# Patient Record
Sex: Male | Born: 1975 | Race: Black or African American | Hispanic: No | Marital: Single | Smoking: Current every day smoker
Health system: Southern US, Community
[De-identification: ages and names within clinical notes are randomized; demographics above are authoritative.]

## PROBLEM LIST (undated history)

## (undated) DIAGNOSIS — M1990 Unspecified osteoarthritis, unspecified site: Secondary | ICD-10-CM

## (undated) DIAGNOSIS — G35D Multiple sclerosis, unspecified: Secondary | ICD-10-CM

## (undated) DIAGNOSIS — G35 Multiple sclerosis: Secondary | ICD-10-CM

## (undated) HISTORY — DX: Unspecified osteoarthritis, unspecified site: M19.90

## (undated) HISTORY — DX: Multiple sclerosis: G35

## (undated) HISTORY — PX: OTHER SURGICAL HISTORY: SHX169

## (undated) HISTORY — DX: Multiple sclerosis, unspecified: G35.D

---

## 2010-09-07 ENCOUNTER — Inpatient Hospital Stay (INDEPENDENT_AMBULATORY_CARE_PROVIDER_SITE_OTHER)
Admission: RE | Admit: 2010-09-07 | Discharge: 2010-09-07 | Disposition: A | Discharge status: Home / Self Care | Payer: Self-pay | Source: Ambulatory Visit | Attending: Family Medicine | Admitting: Family Medicine

## 2010-09-07 DIAGNOSIS — M799 Soft tissue disorder, unspecified: Secondary | ICD-10-CM

## 2011-04-29 ENCOUNTER — Emergency Department (HOSPITAL_COMMUNITY)
Admission: EM | Admit: 2011-04-29 | Discharge: 2011-04-29 | Disposition: A | Discharge status: Home / Self Care | Payer: Self-pay | Attending: Emergency Medicine | Admitting: Emergency Medicine

## 2011-04-29 DIAGNOSIS — W260XXA Contact with knife, initial encounter: Secondary | ICD-10-CM | POA: Insufficient documentation

## 2011-04-29 DIAGNOSIS — S61409A Unspecified open wound of unspecified hand, initial encounter: Secondary | ICD-10-CM | POA: Insufficient documentation

## 2011-04-29 DIAGNOSIS — W261XXA Contact with sword or dagger, initial encounter: Secondary | ICD-10-CM | POA: Insufficient documentation

## 2011-04-29 DIAGNOSIS — M79609 Pain in unspecified limb: Secondary | ICD-10-CM | POA: Insufficient documentation

## 2011-04-29 DIAGNOSIS — S61419A Laceration without foreign body of unspecified hand, initial encounter: Secondary | ICD-10-CM

## 2011-04-29 DIAGNOSIS — F17200 Nicotine dependence, unspecified, uncomplicated: Secondary | ICD-10-CM | POA: Insufficient documentation

## 2011-04-29 MED ORDER — TETANUS-DIPHTH-ACELL PERTUSSIS 5-2.5-18.5 LF-MCG/0.5 IM SUSP
0.5000 mL | Freq: Once | INTRAMUSCULAR | Status: AC
  Administered 2011-04-29: 0.5 mL via INTRAMUSCULAR
  Filled 2011-04-29: qty 0.5

## 2011-04-29 MED ORDER — DOXYCYCLINE HYCLATE 100 MG PO CAPS: 100.0000 mg | ORAL_CAPSULE | Freq: Two times a day (BID) | ORAL | Status: AC
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
## 2011-04-29 NOTE — ED Notes (Signed)
1 inch laceration to lt. Palm area from cutting meat.  Bleeding controlled, dressing applied

## 2011-04-29 NOTE — ED Provider Notes (Signed)
History     CSN: [id number]  Arrival date & time 04/29/11  1230   First MD Initiated Contact with Patient 04/29/11 1349      Chief Complaint  Patient presents with  . Laceration    (Consider location/radiation/quality/duration/timing/severity/associated sxs/prior treatment) Patient is a 35 y.o. male presenting with skin laceration. The history is provided by the patient (The patient states that he accidentally cut his left hand while cutting knife).  Laceration  The incident occurred 3 to 5 hours ago. Pain location: Left hand. The laceration is 1 cm in size. The laceration mechanism was a a clean knife. The pain is at a severity of 2/10. The pain is mild. The pain has been constant since onset. She reports no foreign bodies present. Her tetanus status is out of date.    History reviewed. No pertinent past medical history.  History reviewed. No pertinent past surgical history.  History reviewed. No pertinent family history.  History  Substance Use Topics  . Smoking status: Current Everyday Smoker  . Smokeless tobacco: Not on file  . Alcohol Use: Yes    OB History    Grav Para Term Preterm Abortions TAB SAB Ect Mult Living                  Review of Systems  Constitutional: Negative for fatigue.  HENT: Negative for congestion, sinus pressure and ear discharge.   Eyes: Negative for discharge.  Respiratory: Negative for cough.   Cardiovascular: Negative for chest pain.  Gastrointestinal: Negative for abdominal pain and diarrhea.  Genitourinary: Negative for frequency and hematuria.  Musculoskeletal: Negative for back pain.       1 cm lac to thenar eminence left hand  Skin: Negative for rash.  Neurological: Negative for seizures and headaches.  Hematological: Negative.   Psychiatric/Behavioral: Negative for hallucinations.    Allergies  Review of patient's allergies indicates no known allergies.  Home Medications   Current Outpatient Rx  Name Route Sig  Dispense Refill  . DOXYCYCLINE HYCLATE 100 MG PO CAPS Oral Take 1 capsule (100 mg total) by mouth 2 (two) times daily. 14 capsule 0    BP 131/76  Pulse 82  Temp(Src) 98 F (36.7 C) (Oral)  Resp 20  Ht 6\' 4"  (1.93 m)  Wt 310 lb (140.615 kg)  BMI 37.73 kg/m2  SpO2 96%  Physical Exam  Constitutional: She is oriented to person, place, and time. She appears well-developed.  HENT:  Head: Normocephalic and atraumatic.  Eyes: Conjunctivae and EOM are normal. No scleral icterus.  Neck: Neck supple. No tracheal deviation present. No thyromegaly present.  Cardiovascular: Normal rate and regular rhythm.  Exam reveals no gallop and no friction rub.   No murmur heard. Pulmonary/Chest: No stridor. She has no wheezes. She has no rales. She exhibits no tenderness.  Abdominal: She exhibits no distension. There is no tenderness. There is no rebound.  Musculoskeletal: Normal range of motion. She exhibits no edema.       1 cm lac to left hand, thenar eminence.  neurovasc normal  Lymphadenopathy:    She has no cervical adenopathy.  Neurological: She is oriented to person, place, and time. Coordination normal.  Skin: Skin is warm. No rash noted. No erythema.  Psychiatric: She has a normal mood and affect. Her behavior is normal.    ED Course  LACERATION REPAIR Performed by: Dekisha Mesmer L Authorized by: Bethann Berkshire L Consent: Verbal consent obtained. Risks and benefits: risks, benefits and alternatives were  discussed Laceration length: 1 cm Local anesthetic: lidocaine 1% without epinephrine Anesthetic total: 2 ml Preparation: Patient was prepped and draped in the usual sterile fashion. Irrigation solution: saline Amount of cleaning: standard Debridement: none Skin closure: 4-0 nylon Tendon closure: 4-0 Monocryl Number of sutures: 3 Technique: simple Comments: Patient had a 1 cm laceration to his left thenar eminence. The area was cleaned thoroughly with Betadine and sterile water.  Lidocaine without and epi was used to anesthetize the laceration. The patient had 3 sutures which were 4-0 nylon. Interrupted sutures the patient tolerated the procedure well and   (including critical care time)  Labs Reviewed - No data to display No results found.   1. Laceration of hand       MDM          Benny Lennert, MD 04/29/11 1430

## 2012-08-14 ENCOUNTER — Emergency Department (HOSPITAL_COMMUNITY)
Admission: EM | Admit: 2012-08-14 | Discharge: 2012-08-14 | Disposition: A | Discharge status: Home / Self Care | Payer: Self-pay | Attending: Emergency Medicine | Admitting: Emergency Medicine

## 2012-08-14 ENCOUNTER — Encounter (HOSPITAL_COMMUNITY): Payer: Self-pay

## 2012-08-14 ENCOUNTER — Emergency Department (HOSPITAL_COMMUNITY): Payer: Self-pay

## 2012-08-14 DIAGNOSIS — F17200 Nicotine dependence, unspecified, uncomplicated: Secondary | ICD-10-CM | POA: Insufficient documentation

## 2012-08-14 DIAGNOSIS — S42112A Displaced fracture of body of scapula, left shoulder, initial encounter for closed fracture: Secondary | ICD-10-CM

## 2012-08-14 DIAGNOSIS — S42199A Fracture of other part of scapula, unspecified shoulder, initial encounter for closed fracture: Secondary | ICD-10-CM | POA: Insufficient documentation

## 2012-08-14 DIAGNOSIS — S42102A Fracture of unspecified part of scapula, left shoulder, initial encounter for closed fracture: Secondary | ICD-10-CM

## 2012-08-14 MED ORDER — OXYCODONE-ACETAMINOPHEN 5-325 MG PO TABS: 1.0000 | ORAL_TABLET | ORAL | Status: DC | PRN

## 2012-08-14 MED ORDER — OXYCODONE-ACETAMINOPHEN 5-325 MG PO TABS
1.0000 | ORAL_TABLET | Freq: Once | ORAL | Status: AC
  Administered 2012-08-14: 1 via ORAL
  Filled 2012-08-14: qty 1
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
## 2012-08-14 NOTE — Progress Notes (Signed)
Orthopedic Tech Progress Note Patient Details:  Drayven Marchena [birth year] [id number]  Ortho Devices Type of Ortho Device: Sling immobilizer Ortho Device/Splint Interventions: Application   Shawnie Pons 08/14/2012, 7:59 AM

## 2012-10-21 ENCOUNTER — Emergency Department (HOSPITAL_COMMUNITY)
Admission: EM | Admit: 2012-10-21 | Discharge: 2012-10-21 | Disposition: A | Discharge status: Home / Self Care | Payer: Self-pay | Attending: Emergency Medicine | Admitting: Emergency Medicine

## 2012-10-21 ENCOUNTER — Encounter (HOSPITAL_COMMUNITY): Payer: Self-pay | Admitting: Emergency Medicine

## 2012-10-21 DIAGNOSIS — F17200 Nicotine dependence, unspecified, uncomplicated: Secondary | ICD-10-CM | POA: Insufficient documentation

## 2012-10-21 DIAGNOSIS — IMO0002 Reserved for concepts with insufficient information to code with codable children: Secondary | ICD-10-CM | POA: Insufficient documentation

## 2012-10-21 DIAGNOSIS — T07XXXA Unspecified multiple injuries, initial encounter: Secondary | ICD-10-CM

## 2012-10-21 MED ORDER — OXYCODONE-ACETAMINOPHEN 5-325 MG PO TABS: 1.0000 | ORAL_TABLET | Freq: Four times a day (QID) | ORAL | Status: DC | PRN

## 2012-10-21 MED ORDER — BACITRACIN ZINC 500 UNIT/GM EX OINT: TOPICAL_OINTMENT | Freq: Two times a day (BID) | CUTANEOUS | Status: DC

## 2012-10-21 MED ORDER — OXYCODONE-ACETAMINOPHEN 5-325 MG PO TABS
1.0000 | ORAL_TABLET | Freq: Once | ORAL | Status: AC
  Administered 2012-10-21: 1 via ORAL
  Filled 2012-10-21: qty 1

## 2012-10-21 MED ORDER — TETANUS-DIPHTH-ACELL PERTUSSIS 5-2.5-18.5 LF-MCG/0.5 IM SUSP
0.5000 mL | Freq: Once | INTRAMUSCULAR | Status: AC
  Administered 2012-10-21: 0.5 mL via INTRAMUSCULAR
  Filled 2012-10-21: qty 0.5
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
## 2013-05-04 ENCOUNTER — Emergency Department (HOSPITAL_COMMUNITY)
Admission: EM | Admit: 2013-05-04 | Discharge: 2013-05-04 | Disposition: A | Discharge status: Home / Self Care | Payer: Self-pay | Attending: Emergency Medicine | Admitting: Emergency Medicine

## 2013-05-04 ENCOUNTER — Emergency Department (HOSPITAL_COMMUNITY): Payer: Self-pay

## 2013-05-04 ENCOUNTER — Encounter (HOSPITAL_COMMUNITY): Payer: Self-pay | Admitting: Emergency Medicine

## 2013-05-04 DIAGNOSIS — R6889 Other general symptoms and signs: Secondary | ICD-10-CM | POA: Insufficient documentation

## 2013-05-04 DIAGNOSIS — R05 Cough: Secondary | ICD-10-CM | POA: Insufficient documentation

## 2013-05-04 DIAGNOSIS — F17200 Nicotine dependence, unspecified, uncomplicated: Secondary | ICD-10-CM | POA: Insufficient documentation

## 2013-05-04 DIAGNOSIS — Z77098 Contact with and (suspected) exposure to other hazardous, chiefly nonmedicinal, chemicals: Secondary | ICD-10-CM | POA: Insufficient documentation

## 2013-05-04 DIAGNOSIS — R059 Cough, unspecified: Secondary | ICD-10-CM | POA: Insufficient documentation

## 2013-05-04 DIAGNOSIS — J3489 Other specified disorders of nose and nasal sinuses: Secondary | ICD-10-CM | POA: Insufficient documentation

## 2013-05-04 DIAGNOSIS — Z79899 Other long term (current) drug therapy: Secondary | ICD-10-CM | POA: Insufficient documentation

## 2013-05-04 DIAGNOSIS — H5710 Ocular pain, unspecified eye: Secondary | ICD-10-CM | POA: Insufficient documentation

## 2013-05-04 MED ORDER — BENZONATATE 100 MG PO CAPS: 100.0000 mg | ORAL_CAPSULE | Freq: Three times a day (TID) | ORAL | Status: DC
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
## 2013-05-04 NOTE — ED Notes (Signed)
Patient returned from X-ray 

## 2013-05-04 NOTE — ED Notes (Signed)
Patient transported to X-ray 
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
## 2013-05-05 NOTE — ED Provider Notes (Signed)
Medical screening examination/treatment/procedure(s) were performed by non-physician practitioner and as supervising physician I was immediately available for consultation/collaboration.    Rayford Williamsen R Clint Biello, MD 05/05/13 1540 

## 2013-07-03 ENCOUNTER — Emergency Department (HOSPITAL_COMMUNITY): Payer: Medicaid Other

## 2013-07-03 ENCOUNTER — Encounter (HOSPITAL_COMMUNITY): Payer: Self-pay | Admitting: Emergency Medicine

## 2013-07-03 ENCOUNTER — Inpatient Hospital Stay (HOSPITAL_COMMUNITY)
Admission: EM | Admit: 2013-07-03 | Discharge: 2013-07-09 | DRG: 060 | Disposition: A | Discharge status: Home Health | Payer: Medicaid Other | Attending: Internal Medicine | Admitting: Internal Medicine

## 2013-07-03 DIAGNOSIS — R531 Weakness: Secondary | ICD-10-CM | POA: Diagnosis present

## 2013-07-03 DIAGNOSIS — R200 Anesthesia of skin: Secondary | ICD-10-CM | POA: Diagnosis present

## 2013-07-03 DIAGNOSIS — H5500 Unspecified nystagmus: Secondary | ICD-10-CM | POA: Diagnosis present

## 2013-07-03 DIAGNOSIS — T380X5A Adverse effect of glucocorticoids and synthetic analogues, initial encounter: Secondary | ICD-10-CM | POA: Diagnosis present

## 2013-07-03 DIAGNOSIS — G35 Multiple sclerosis: Secondary | ICD-10-CM | POA: Diagnosis present

## 2013-07-03 DIAGNOSIS — W19XXXA Unspecified fall, initial encounter: Secondary | ICD-10-CM | POA: Diagnosis present

## 2013-07-03 DIAGNOSIS — F17200 Nicotine dependence, unspecified, uncomplicated: Secondary | ICD-10-CM | POA: Diagnosis present

## 2013-07-03 DIAGNOSIS — R7309 Other abnormal glucose: Secondary | ICD-10-CM | POA: Diagnosis present

## 2013-07-03 LAB — COMPREHENSIVE METABOLIC PANEL
ALT: 17 U/L (ref 0–53)
AST: 16 U/L (ref 0–37)
Albumin: 4 g/dL (ref 3.5–5.2)
Alkaline Phosphatase: 45 U/L (ref 39–117)
BUN: 12 mg/dL (ref 6–23)
CO2: 23 mEq/L (ref 19–32)
Calcium: 9.1 mg/dL (ref 8.4–10.5)
Chloride: 102 mEq/L (ref 96–112)
Creatinine, Ser: 1.03 mg/dL (ref 0.50–1.35)
GFR calc Af Amer: >90 mL/min (ref >90)
GFR calc non Af Amer: >90 mL/min (ref >90)
Glucose, Bld: 98 mg/dL (ref 70–99)
Potassium: 3.8 mEq/L (ref 3.7–5.3)
Sodium: 139 mEq/L (ref 137–147)
Total Bilirubin: 1.2 mg/dL (ref 0.3–1.2)
Total Protein: 7.4 g/dL (ref 6.0–8.3)

## 2013-07-03 LAB — BASIC METABOLIC PANEL
BUN: 9 mg/dL (ref 6–23)
CALCIUM: 9.7 mg/dL (ref 8.4–10.5)
CO2: 21 meq/L (ref 19–32)
CREATININE: 1.07 mg/dL (ref 0.50–1.35)
Chloride: 103 mEq/L (ref 96–112)
GFR calc Af Amer: >90 mL/min (ref >90)
GFR calc non Af Amer: 86 mL/min — ABNORMAL LOW (ref >90)
Glucose, Bld: 97 mg/dL (ref 70–99)
Potassium: 3.6 mEq/L — ABNORMAL LOW (ref 3.7–5.3)
Sodium: 142 mEq/L (ref 137–147)

## 2013-07-03 LAB — CBC WITH DIFFERENTIAL/PLATELET
BASOS ABS: 0 10*3/uL (ref 0.0–0.1)
BASOS PCT: 0 % (ref 0–1)
EOS PCT: 2 % (ref 0–5)
Eosinophils Absolute: 0.1 10*3/uL (ref 0.0–0.7)
HEMATOCRIT: 39.2 % (ref 39.0–52.0)
Hemoglobin: 14 g/dL (ref 13.0–17.0)
LYMPHS PCT: 23 % (ref 12–46)
Lymphs Abs: 1.9 10*3/uL (ref 0.7–4.0)
MCH: 33.4 pg (ref 26.0–34.0)
MCHC: 35.7 g/dL (ref 30.0–36.0)
MCV: 93.6 fL (ref 78.0–100.0)
MONO ABS: 0.5 10*3/uL (ref 0.1–1.0)
Monocytes Relative: 6 % (ref 3–12)
Neutro Abs: 5.6 10*3/uL (ref 1.7–7.7)
Neutrophils Relative %: 69 % (ref 43–77)
Platelets: 244 10*3/uL (ref 150–400)
RBC: 4.19 MIL/uL — ABNORMAL LOW (ref 4.22–5.81)
RDW: 11.8 % (ref 11.5–15.5)
WBC: 8.1 10*3/uL (ref 4.0–10.5)

## 2013-07-03 LAB — APTT: aPTT: 27 seconds (ref 24–37)

## 2013-07-03 LAB — ETHANOL: Alcohol, Ethyl (B): <11 mg/dL (ref 0–11)

## 2013-07-03 LAB — CBC
HCT: 37.5 % — ABNORMAL LOW (ref 39.0–52.0)
Hemoglobin: 13.1 g/dL (ref 13.0–17.0)
MCH: 32.7 pg (ref 26.0–34.0)
MCHC: 34.9 g/dL (ref 30.0–36.0)
MCV: 93.5 fL (ref 78.0–100.0)
Platelets: 240 10*3/uL (ref 150–400)
RBC: 4.01 MIL/uL — ABNORMAL LOW (ref 4.22–5.81)
RDW: 11.6 % (ref 11.5–15.5)
WBC: 8 10*3/uL (ref 4.0–10.5)

## 2013-07-03 LAB — DIFFERENTIAL
Basophils Absolute: 0 10*3/uL (ref 0.0–0.1)
Basophils Relative: 0 % (ref 0–1)
Eosinophils Absolute: 0.2 10*3/uL (ref 0.0–0.7)
Eosinophils Relative: 3 % (ref 0–5)
Lymphocytes Relative: 38 % (ref 12–46)
Lymphs Abs: 3.1 10*3/uL (ref 0.7–4.0)
Monocytes Absolute: 0.5 10*3/uL (ref 0.1–1.0)
Monocytes Relative: 6 % (ref 3–12)
Neutro Abs: 4.2 10*3/uL (ref 1.7–7.7)
Neutrophils Relative %: 53 % (ref 43–77)

## 2013-07-03 LAB — PROTIME-INR
INR: 0.98 (ref 0.00–1.49)
Prothrombin Time: 12.8 seconds (ref 11.6–15.2)

## 2013-07-03 LAB — I-STAT TROPONIN, ED: Troponin i, poc: 0 ng/mL (ref 0.00–0.08)

## 2013-07-03 MED ORDER — KETOROLAC TROMETHAMINE 15 MG/ML IJ SOLN
30.0000 mg | Freq: Once | INTRAMUSCULAR | Status: AC
  Administered 2013-07-03: 30 mg via INTRAVENOUS
  Filled 2013-07-03: qty 2

## 2013-07-03 MED ORDER — MORPHINE SULFATE 4 MG/ML IJ SOLN
6.0000 mg | Freq: Once | INTRAMUSCULAR | Status: AC
  Administered 2013-07-03: 6 mg via INTRAVENOUS
  Filled 2013-07-03: qty 2

## 2013-07-03 NOTE — ED Provider Notes (Signed)
  Face-to-face evaluation   History: He complains of numbness in right arm, and hand, for 2 days. Tonight he was walking, then noticed numbness in his right leg, and difficulty moving his right leg, which caused him to fall. He has a sensation of his equilibrium being off. He injured his right knee in the fall. Some friends with his, helped him up after the fall. He continues to have numbness in right hand and right leg, and right foot. He also has noticed some numbness in his left foot. He came by EMS. He feels like he is unable to walk. He has not had this previously. He denies headache or neck pain.   Physical exam: Alert, cooperative. Cranial nerves appear intact. There is no facial numbness. He has mildly decreased light-touch sensation of the right hand and right foot. He has unilateral right-beating nystagmus. Head impulse testing causes nystagmus, bilaterally. Test of skew is positive, in the right eye with saccade. Abnormal right finger to nose.  Medical screening examination/treatment/procedure(s) were conducted as a shared visit with non-physician practitioner(s) and myself.  I personally evaluated the patient during the encounter  Flint Melter, MD 07/05/13 [phone]
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
## 2013-07-03 NOTE — ED Provider Notes (Signed)
CSN: [id number]     Arrival date & time 07/03/13  1731 History   First MD Initiated Contact with Patient 07/03/13 2046     Chief Complaint  Patient presents with  . Numbness     (Consider location/radiation/quality/duration/timing/severity/associated sxs/prior Treatment) HPI Pt is a 38yo male brought in by EMS c/o right sided weakness and numbness that started 2 days ago. Weakness and numbness is in right hand and right leg. Pt states earlier today was walking up his driveway when he fell in the mud, was unable to stand due to right sided weakness. States his neighbor helped carry him inside his house until EMS got there. States he cannot stand or walk due to pain and weakness.  Reports recently getting out of prison, was lifting weights 2 days ago and started having symptoms.  States he did have a similar episode in prison but states "it was a big deal" but did not go into detail. States they did not find cause of weakness.  Pt also c/o pain in his back, worse in lower back, 12/10, cramping "like a knot."  Has tried Aleve w/o relief.   Denies head, neck, or chest pain. Denies SOB.  Denies hx of cancer or IVDU. No change in bowel or bladder habits. Denies fever, n/v/d.   History reviewed. No pertinent past medical history. History reviewed. No pertinent past surgical history. Family History  Problem Relation Age of Onset  . Diabetes type I Father    History  Substance Use Topics  . Smoking status: Current Every Day Smoker -- 0.75 packs/day for 24 years    Types: Cigarettes  . Smokeless tobacco: Never Used  . Alcohol Use: 12.0 oz/week    20 Cans of beer per week    Review of Systems  Constitutional: Negative for fever and chills.  Gastrointestinal: Positive for abdominal pain. Negative for nausea, vomiting, diarrhea and constipation.  Musculoskeletal: Positive for back pain. Negative for neck pain and neck stiffness.  Neurological: Positive for weakness and numbness. Negative for  headaches.       Right side  All other systems reviewed and are negative.      Allergies  Review of patient's allergies indicates no known allergies.  Home Medications  No current outpatient prescriptions on file. BP 110/49  Pulse 60  Temp(Src) 98.2 F (36.8 C) (Oral)  Resp 18  SpO2 100% Physical Exam  Nursing note and vitals reviewed. Constitutional: He is oriented to person, place, and time. He appears well-developed and well-nourished.  HENT:  Head: Normocephalic and atraumatic.  Eyes: Conjunctivae are normal. No scleral icterus.  Neck: Normal range of motion. Neck supple.  No nuchal rigidity or meningeal signs.  Cardiovascular: Normal rate, regular rhythm and normal heart sounds.   Pulmonary/Chest: Effort normal and breath sounds normal. No respiratory distress. He has no wheezes. He has no rales. He exhibits no tenderness.  Abdominal: Soft. Bowel sounds are normal. He exhibits no distension and no mass. There is no tenderness. There is no rebound and no guarding.  Musculoskeletal: He exhibits tenderness (musculature of back and along lumbar spine. no step offs or crepitus). He exhibits no edema.  Neurological: He is alert and oriented to person, place, and time. No cranial nerve deficit. Coordination normal.  CN II-XII grossly in tact. Pt refusing to walk, states he cannot stand due to pain and weakness.  5/5 grip strength bilaterally.  5/5 plantarflexion and dorsiflexion bilaterally. Sensation to light touch in tact to upper and lower extremities  bilaterally.   Skin: Skin is warm and dry.    ED Course  Procedures (including critical care time) Labs Review Labs Reviewed  CBC WITH DIFFERENTIAL - Abnormal; Notable for the following:    RBC 4.19 (*)    All other components within normal limits  BASIC METABOLIC PANEL - Abnormal; Notable for the following:    Potassium 3.6 (*)    GFR calc non Af Amer 86 (*)    All other components within normal limits  CBC - Abnormal;  Notable for the following:    RBC 4.01 (*)    HCT 37.5 (*)    All other components within normal limits  ETHANOL  PROTIME-INR  APTT  DIFFERENTIAL  COMPREHENSIVE METABOLIC PANEL  URINE RAPID DRUG SCREEN (HOSP PERFORMED)  URINALYSIS, ROUTINE W REFLEX MICROSCOPIC  I-STAT TROPOININ, ED  I-STAT TROPOININ, ED   Imaging Review No results found.   EKG Interpretation   Date/Time:  Wednesday July 03 2013 23:02:37 EST Ventricular Rate:  70 PR Interval:  194 QRS Duration: 92 QT Interval:  383 QTC Calculation: 413 R Axis:   22 Text Interpretation:  Normal sinus rhythm Non-specific ST-t changes  Abnormal ECG No old tracing to compare Confirmed by [facility]  MD, ELLIOTT  [phone] on 07/04/2013 12:07:10 AM      MDM   Final diagnoses:  None    pt presenting with c/o right sided weakness in right arm and leg that started 2 days ago.  Pt is a muscular and fit.  Pt appears to have 5/5 bilateral UE and LE strength. CN II-XII appear grossly intact.  Pt states he cannot walk due to weakness and pain.  Discussed pt with Dr. Effie ShyWentz who also examined pt.  Some concern for CVA, intracranial bleed, or mass.  As symptoms started 2 days ago, will not activate code stroke as pt is not a candidate for TPA but will perform stroke workup.  Will start with CT head w/o contrast.    23:07- EKG: ST-elevation, pt denies chest pain or SOB.  i-stat troponin negative.  Will get repeat troponin and EKG in 3 hours.   1:11 AM Pt signed out to Dr. Dierdre Highmanpitz who will resume care of pt.  Plan is to f/u with head CT.  Pt likely to be admitted for further workup of numbness and weakness.        Junius FinnerErin O'Malley, PA-C 07/04/13 [id number]

## 2013-07-04 ENCOUNTER — Observation Stay (HOSPITAL_COMMUNITY): Payer: Medicaid Other

## 2013-07-04 DIAGNOSIS — R531 Weakness: Secondary | ICD-10-CM | POA: Diagnosis present

## 2013-07-04 DIAGNOSIS — R209 Unspecified disturbances of skin sensation: Secondary | ICD-10-CM

## 2013-07-04 DIAGNOSIS — H5500 Unspecified nystagmus: Secondary | ICD-10-CM | POA: Diagnosis present

## 2013-07-04 DIAGNOSIS — R200 Anesthesia of skin: Secondary | ICD-10-CM | POA: Diagnosis present

## 2013-07-04 LAB — URINALYSIS, ROUTINE W REFLEX MICROSCOPIC
Bilirubin Urine: NEGATIVE
Glucose, UA: NEGATIVE mg/dL
Hgb urine dipstick: NEGATIVE
Ketones, ur: NEGATIVE mg/dL
Nitrite: NEGATIVE
Protein, ur: NEGATIVE mg/dL
Specific Gravity, Urine: 1.03 (ref 1.005–1.030)
Urobilinogen, UA: 1 mg/dL (ref 0.0–1.0)
pH: 5.5 (ref 5.0–8.0)

## 2013-07-04 LAB — RAPID URINE DRUG SCREEN, HOSP PERFORMED
Amphetamines: NOT DETECTED
Barbiturates: NOT DETECTED
Benzodiazepines: NOT DETECTED
Cocaine: NOT DETECTED
Opiates: POSITIVE — AB
Tetrahydrocannabinol: POSITIVE — AB

## 2013-07-04 LAB — GLUCOSE, CAPILLARY: GLUCOSE-CAPILLARY: 106 mg/dL — AB (ref 70–99)

## 2013-07-04 LAB — URINE MICROSCOPIC-ADD ON

## 2013-07-04 MED ORDER — GADOBENATE DIMEGLUMINE 529 MG/ML IV SOLN
20.0000 mL | Freq: Once | INTRAVENOUS | Status: AC | PRN
  Administered 2013-07-04: 20 mL via INTRAVENOUS

## 2013-07-04 MED ORDER — PNEUMOCOCCAL VAC POLYVALENT 25 MCG/0.5ML IJ INJ
0.5000 mL | INJECTION | INTRAMUSCULAR | Status: AC
  Administered 2013-07-05: 0.5 mL via INTRAMUSCULAR
  Filled 2013-07-04 (×3): qty 0.5

## 2013-07-04 MED ORDER — ASPIRIN 300 MG RE SUPP
300.0000 mg | Freq: Every day | RECTAL | Status: DC
  Filled 2013-07-04 (×6): qty 1

## 2013-07-04 MED ORDER — ASPIRIN 325 MG PO TABS
325.0000 mg | ORAL_TABLET | Freq: Every day | ORAL | Status: DC
  Administered 2013-07-04 – 2013-07-09 (×6): 325 mg via ORAL
  Filled 2013-07-04 (×6): qty 1

## 2013-07-04 MED ORDER — ASPIRIN EC 81 MG PO TBEC: 81.0000 mg | DELAYED_RELEASE_TABLET | Freq: Every day | ORAL | Status: DC

## 2013-07-04 MED ORDER — SODIUM CHLORIDE 0.9 % IV SOLN: 250.0000 mL | INTRAVENOUS | Status: DC | PRN

## 2013-07-04 MED ORDER — OXYCODONE-ACETAMINOPHEN 5-325 MG PO TABS
1.0000 | ORAL_TABLET | ORAL | Status: DC | PRN
  Administered 2013-07-04 – 2013-07-09 (×19): 2 via ORAL
  Filled 2013-07-04 (×19): qty 2

## 2013-07-04 MED ORDER — PANTOPRAZOLE SODIUM 40 MG PO TBEC
40.0000 mg | DELAYED_RELEASE_TABLET | ORAL | Status: DC
  Administered 2013-07-04 – 2013-07-08 (×5): 40 mg via ORAL
  Filled 2013-07-04 (×8): qty 1

## 2013-07-04 MED ORDER — SODIUM CHLORIDE 0.9 % IJ SOLN
3.0000 mL | Freq: Two times a day (BID) | INTRAMUSCULAR | Status: DC
  Administered 2013-07-04 – 2013-07-05 (×5): 3 mL via INTRAVENOUS

## 2013-07-04 MED ORDER — LORAZEPAM 2 MG/ML IJ SOLN
1.0000 mg | Freq: Once | INTRAMUSCULAR | Status: AC
  Administered 2013-07-04: 1 mg via INTRAVENOUS
  Filled 2013-07-04: qty 1

## 2013-07-04 MED ORDER — LORAZEPAM 2 MG/ML IJ SOLN: 1.0000 mg | Freq: Two times a day (BID) | INTRAMUSCULAR | Status: DC | PRN

## 2013-07-04 MED ORDER — SODIUM CHLORIDE 0.9 % IJ SOLN: 3.0000 mL | INTRAMUSCULAR | Status: DC | PRN

## 2013-07-04 MED ORDER — INFLUENZA VAC SPLIT QUAD 0.5 ML IM SUSP
0.5000 mL | INTRAMUSCULAR | Status: AC
  Administered 2013-07-05: 0.5 mL via INTRAMUSCULAR
  Filled 2013-07-04 (×3): qty 0.5

## 2013-07-04 MED ORDER — HYDROMORPHONE HCL PF 1 MG/ML IJ SOLN
1.0000 mg | INTRAMUSCULAR | Status: DC | PRN
  Administered 2013-07-08: 1 mg via INTRAVENOUS
  Filled 2013-07-04: qty 1

## 2013-07-04 MED ORDER — NICOTINE 21 MG/24HR TD PT24
21.0000 mg | MEDICATED_PATCH | Freq: Every day | TRANSDERMAL | Status: DC
  Administered 2013-07-04 – 2013-07-05 (×2): 21 mg via TRANSDERMAL
  Filled 2013-07-04 (×2): qty 1

## 2013-07-04 MED ORDER — SODIUM CHLORIDE 0.9 % IJ SOLN
3.0000 mL | Freq: Two times a day (BID) | INTRAMUSCULAR | Status: DC
  Administered 2013-07-04 – 2013-07-09 (×10): 3 mL via INTRAVENOUS

## 2013-07-04 MED ORDER — SODIUM CHLORIDE 0.9 % IV SOLN
1000.0000 mg | INTRAVENOUS | Status: AC
  Administered 2013-07-04 – 2013-07-06 (×3): 1000 mg via INTRAVENOUS
  Filled 2013-07-04 (×3): qty 8

## 2013-07-04 NOTE — Progress Notes (Signed)
UR completed 

## 2013-07-04 NOTE — Progress Notes (Addendum)
MRI results noted. Multiple T2 Lesions some with enhancement both of the spinal cord and brain. Will start IV solumedrol for likely multiple sclerosis flare. Also will start protonix and CBG monitoring while on steroids.   Ritta Slot, MD [facility] [phone]  If 7pm- 7am, please page neurology on call at [phone].

## 2013-07-04 NOTE — Progress Notes (Signed)
Patient seen and examined at bedside. He was admitted for further evaluation of right upper and lower extremity numbness and weakness. Neurology was consulted. Plan for MRI of the brain this afternoon for further evaluation. Patient passed swallow evaluation, will place order for regular diet. Appreciate neurology assistance. Please see detailed admission note by Dr. Onalee Hua.  Debbora Presto, MD  [facility] Pager [phone]  If 7PM-7AM, please contact night-coverage [email or web address] Password [email or web address]

## 2013-07-04 NOTE — Consult Note (Signed)
Neurology Consultation Reason for Consult: Right-sided weakness/numbness Referring Physician: Vania Rea  CC: Right-sided weakness and numbness  History is obtained from: Patient  HPI: Bobby Stafford is a 38 y.o. male with several day history of right-sided weakness and numbness that is affecting leg more than the arm. He states that he has had something similar in the past. He also describes a bandlike sensation around his midabdomen that feels like a "tightening."    LKW: 4 days ago tpa given?: no, outside of window    ROS: A 14 point ROS was performed and is negative except as noted in the HPI.  History reviewed. No pertinent past medical history.  Family History: No history of stroke  Social History: Tob: Current smoker  Exam: Current vital signs: BP 110/49  Pulse 60  Temp(Src) 98.2 F (36.8 C) (Oral)  Resp 18  SpO2 100% Vital signs in last 24 hours: Temp:  [98.2 F (36.8 C)] 98.2 F (36.8 C) (03/04 2310) Pulse Rate:  [60-93] 60 (03/05 0108) Resp:  [18-20] 18 (03/05 0108) BP: (110-135)/(49-74) 110/49 mmHg (03/05 0108) SpO2:  [96 %-100 %] 100 % (03/05 0108)  General: In bed, NAD CV: Regular rate and rhythm Mental Status: Patient is awake, alert, oriented to person, place, month, year, and situation. Immediate and remote memory are intact. Patient is able to give a clear and coherent history. No signs of aphasia or neglect Cranial Nerves: II: Visual Fields are full. Pupils are equal, round, and reactive to light.  Discs are difficult to visualize. III,IV, VI: EOMI without ptosis or diploplia.  V: Facial sensation is symmetric to temperature VII: Facial movement is symmetric.  VIII: hearing is intact to voice X: Uvula elevates symmetrically XI: Shoulder shrug is symmetric. XII: tongue is midline without atrophy or fasciculations.  Motor: Tone is normal. Bulk is normal. 5/5 strength was present on the left, he has 4/5 weakness of the right arm and 4 minus/5  weakness of the right leg Sensory: Sensation is diminished in the right arm and leg Deep Tendon Reflexes: 2+ and symmetric in the biceps and patellae.  Cerebellar: FNF intact on the left, appears ataxic on the right Gait: Not tested due to patient safety concerns        I have reviewed labs in epic and the results pertinent to this consultation are: CBC-unremarkable, CMP-normal  I have reviewed the images obtained: CT head-possible subcortical white matter hypodensity in the right frontal region  Impression: 38 year old male with a history of at least one other episode concerning for numbness who presents with right arm and leg numbness and weakness of 4 days duration. Given the bandlike sensation, previous episode, young age without stroke risk factors, I am concerned for possible demyelinating disease as an etiology for his weakness.  At this time, I would favor imaging the the brain and C-spine for his current symptoms, as well as the thoracic spine given the bandlike sensation.  Recommendations: 1) MRI brain, C-spine, T-spine with/without contrast 2) would favor giving aspirin now, though can stop if MRI is negative for stroke 3) would favor holding off on steroids or other treatments until the diagnosis is made 4) stroke workup if MRI is positive for stroke  5) Will continue to follow   Ritta Slot, MD [facility] [phone]  If 7pm- 7am, please page neurology on call at [phone].

## 2013-07-04 NOTE — H&P (Signed)
Chief Complaint:  Numb right arm and leg  HPI: 38 yo male with 4 days of right arm and leg numbness and weakness.  Denies any n/t or facial disturbance.  No slurred speech.  No fevers.   No rashes.  No vision changes.  Overall healthy, just got out of jail.  No report of recent head injury.  No recent illnesses.    Review of Systems:  Positive and negative as per HPI otherwise all other systems are negative  Past Medical History: History reviewed. No pertinent past medical history. History reviewed. No pertinent past surgical history.  Medications: Prior to Admission medications   Not on File    Allergies:  No Known Allergies  Social History:  reports that he has been smoking Cigarettes.  He has a 18 pack-year smoking history. He has never used smokeless tobacco. He reports that he drinks about 12.0 ounces of alcohol per week. He reports that he uses illicit drugs (Marijuana) about 7 times per week.  Family History: Family History  Problem Relation Age of Onset  . Diabetes type I Father     Physical Exam: Filed Vitals:   07/03/13 1752 07/03/13 2204 07/03/13 2310 07/04/13 0108  BP: 122/60 135/74  110/49  Pulse: 93 80  60  Temp: 98.2 F (36.8 C)  98.2 F (36.8 C)   TempSrc: Oral     Resp: 20 18  18   SpO2: 97% 96%  100%   General appearance: alert, cooperative and no distress Head: Normocephalic, without obvious abnormality, atraumatic Eyes: negative Nose: Nares normal. Septum midline. Mucosa normal. No drainage or sinus tenderness. Neck: no JVD and supple, symmetrical, trachea midline Lungs: clear to auscultation bilaterally Heart: regular rate and rhythm, S1, S2 normal, no murmur, click, rub or gallop Abdomen: soft, non-tender; bowel sounds normal; no masses,  no organomegaly Extremities: extremities normal, atraumatic, no cyanosis or edema Pulses: 2+ and symmetric Skin: Skin color, texture, turgor normal. No rashes or lesions Neurologic: Grossly normal    Strength equal, there is some mild right nystagmus noted.  PERRL,   Labs on Admission:   Recent Labs  07/03/13 1755 07/03/13 2253  NA 142 139  K 3.6* 3.8  CL 103 102  CO2 21 23  GLUCOSE 97 98  BUN 9 12  CREATININE 1.07 1.03  CALCIUM 9.7 9.1    Recent Labs  07/03/13 2253  AST 16  ALT 17  ALKPHOS 45  BILITOT 1.2  PROT 7.4  ALBUMIN 4.0    Recent Labs  07/03/13 1755 07/03/13 2253  WBC 8.1 8.0  NEUTROABS 5.6 4.2  HGB 14.0 13.1  HCT 39.2 37.5*  MCV 93.6 93.5  PLT 244 240    Radiological Exams on Admission: Ct Head Wo Contrast  07/04/2013   CLINICAL DATA:  Right-sided numbness  EXAM: CT HEAD WITHOUT CONTRAST  TECHNIQUE: Contiguous axial images were obtained from the base of the skull through the vertex without intravenous contrast.  COMPARISON:  None.  FINDINGS: Skull and Sinuses:No significant abnormality.  Orbits: No acute abnormality.  Brain: No evidence of acute abnormality, such as acute large vessel infarction, hemorrhage, hydrocephalus, or mass lesion/mass effect. Generalized decrease in cerebral volume for age. Prominent sulcal spaces in the left occipital lobe. There is a focal subcortical white matter low density in the right frontal lobe, nonspecific in location and appearance.  IMPRESSION: 1. No evidence of large vessel infarct or hemorrhage. 2. Cerebral volume loss and white matter disease. The white matter changes could be from  small vessel ischemia or demyelinating disease.   Electronically Signed   By: Tiburcio Pea M.D.   On: 07/04/2013 01:12    Assessment/Plan  38 yo male with 4 days of right arm/leg numbness and weakness with abnormal ct scan head  Principal Problem:   Numbness on right side-  Ct shows concerns for demyuelinating vs ischemia, neurology consulted.  Place on asa for now.  Further w/u per neuro, obs on tele with freq neuro cks.    Active Problems:   Nystagmus   Weakness of right side of body    Juline Sanderford A 07/04/2013, 2:25 AM

## 2013-07-04 NOTE — Progress Notes (Signed)
Pt was sitting up in bed when I arrived. Pt said he wanted prayer b/c of unexplained immobility. Pt presented a positive attitude and testimony of how he and friends had given their lives to [place]. He spoke of his faith and said he wanted prayer. He recalled recent readings from the Bible.  Pt said he wanted his mobility back so he could get back to work. Pt stayed positive during visit. Had prayer w/pt. He was very appreciative and hopeful for answers and recovery. Marjory LiesPamela Carrington Holder Chaplain

## 2013-07-04 NOTE — Progress Notes (Signed)
Chaplain received a consult request to provide pastoral care to patient. Patient's nurse said "patient getting an MRI and not available." Chaplain advised nurse that a chaplain would follow up another time."   07/04/13 1500  Clinical Encounter Type  Visited With Patient not available;Health care provider  Visit Type Initial  Referral From Nurse

## 2013-07-04 NOTE — ED Provider Notes (Signed)
1:56 AM discussed with neurology on-call, Dr. Amada Jupiter. He will evaluate patient here Bobby Stafford long. Plan admission at [facility]. He recommends MRI with contrast as initial step in workup. After that, may require LP but no LP in the ER.  Plan medical admission.  D/w dr Onalee Hua, plan admit  Results for orders placed during the hospital encounter of 07/03/13  CBC WITH DIFFERENTIAL      Result Value Ref Range   WBC 8.1  4.0 - 10.5 K/uL   RBC 4.19 (*) 4.22 - 5.81 MIL/uL   Hemoglobin 14.0  13.0 - 17.0 g/dL   HCT 76.7  34.1 - 93.7 %   MCV 93.6  78.0 - 100.0 fL   MCH 33.4  26.0 - 34.0 pg   MCHC 35.7  30.0 - 36.0 g/dL   RDW 90.2  40.9 - 73.5 %   Platelets 244  150 - 400 K/uL   Neutrophils Relative % 69  43 - 77 %   Neutro Abs 5.6  1.7 - 7.7 K/uL   Lymphocytes Relative 23  12 - 46 %   Lymphs Abs 1.9  0.7 - 4.0 K/uL   Monocytes Relative 6  3 - 12 %   Monocytes Absolute 0.5  0.1 - 1.0 K/uL   Eosinophils Relative 2  0 - 5 %   Eosinophils Absolute 0.1  0.0 - 0.7 K/uL   Basophils Relative 0  0 - 1 %   Basophils Absolute 0.0  0.0 - 0.1 K/uL  BASIC METABOLIC PANEL      Result Value Ref Range   Sodium 142  137 - 147 mEq/L   Potassium 3.6 (*) 3.7 - 5.3 mEq/L   Chloride 103  96 - 112 mEq/L   CO2 21  19 - 32 mEq/L   Glucose, Bld 97  70 - 99 mg/dL   BUN 9  6 - 23 mg/dL   Creatinine, Ser 3.29  0.50 - 1.35 mg/dL   Calcium 9.7  8.4 - 92.4 mg/dL   GFR calc non Af Amer 86 (*) >90 mL/min   GFR calc Af Amer >90  >90 mL/min  ETHANOL      Result Value Ref Range   Alcohol, Ethyl (B) <11  0 - 11 mg/dL  PROTIME-INR      Result Value Ref Range   Prothrombin Time 12.8  11.6 - 15.2 seconds   INR 0.98  0.00 - 1.49  APTT      Result Value Ref Range   aPTT 27  24 - 37 seconds  CBC      Result Value Ref Range   WBC 8.0  4.0 - 10.5 K/uL   RBC 4.01 (*) 4.22 - 5.81 MIL/uL   Hemoglobin 13.1  13.0 - 17.0 g/dL   HCT 26.8 (*) 34.1 - 96.2 %   MCV 93.5  78.0 - 100.0 fL   MCH 32.7  26.0 - 34.0 pg   MCHC 34.9  30.0  - 36.0 g/dL   RDW 22.9  79.8 - 92.1 %   Platelets 240  150 - 400 K/uL  DIFFERENTIAL      Result Value Ref Range   Neutrophils Relative % 53  43 - 77 %   Neutro Abs 4.2  1.7 - 7.7 K/uL   Lymphocytes Relative 38  12 - 46 %   Lymphs Abs 3.1  0.7 - 4.0 K/uL   Monocytes Relative 6  3 - 12 %   Monocytes Absolute 0.5  0.1 - 1.0 K/uL  Eosinophils Relative 3  0 - 5 %   Eosinophils Absolute 0.2  0.0 - 0.7 K/uL   Basophils Relative 0  0 - 1 %   Basophils Absolute 0.0  0.0 - 0.1 K/uL  COMPREHENSIVE METABOLIC PANEL      Result Value Ref Range   Sodium 139  137 - 147 mEq/L   Potassium 3.8  3.7 - 5.3 mEq/L   Chloride 102  96 - 112 mEq/L   CO2 23  19 - 32 mEq/L   Glucose, Bld 98  70 - 99 mg/dL   BUN 12  6 - 23 mg/dL   Creatinine, Ser 1.611.03  0.50 - 1.35 mg/dL   Calcium 9.1  8.4 - 09.610.5 mg/dL   Total Protein 7.4  6.0 - 8.3 g/dL   Albumin 4.0  3.5 - 5.2 g/dL   AST 16  0 - 37 U/L   ALT 17  0 - 53 U/L   Alkaline Phosphatase 45  39 - 117 U/L   Total Bilirubin 1.2  0.3 - 1.2 mg/dL   GFR calc non Af Amer >90  >90 mL/min   GFR calc Af Amer >90  >90 mL/min  URINE RAPID DRUG SCREEN (HOSP PERFORMED)      Result Value Ref Range   Opiates POSITIVE (*) NONE DETECTED   Cocaine NONE DETECTED  NONE DETECTED   Benzodiazepines NONE DETECTED  NONE DETECTED   Amphetamines NONE DETECTED  NONE DETECTED   Tetrahydrocannabinol POSITIVE (*) NONE DETECTED   Barbiturates NONE DETECTED  NONE DETECTED  URINALYSIS, ROUTINE W REFLEX MICROSCOPIC      Result Value Ref Range   Color, Urine AMBER (*) YELLOW   APPearance CLEAR  CLEAR   Specific Gravity, Urine 1.030  1.005 - 1.030   pH 5.5  5.0 - 8.0   Glucose, UA NEGATIVE  NEGATIVE mg/dL   Hgb urine dipstick NEGATIVE  NEGATIVE   Bilirubin Urine NEGATIVE  NEGATIVE   Ketones, ur NEGATIVE  NEGATIVE mg/dL   Protein, ur NEGATIVE  NEGATIVE mg/dL   Urobilinogen, UA 1.0  0.0 - 1.0 mg/dL   Nitrite NEGATIVE  NEGATIVE   Leukocytes, UA TRACE (*) NEGATIVE  URINE MICROSCOPIC-ADD  ON      Result Value Ref Range   Squamous Epithelial / LPF RARE  RARE   WBC, UA 0-2  <3 WBC/hpf   Urine-Other MUCOUS PRESENT    I-STAT TROPOININ, ED      Result Value Ref Range   Troponin i, poc 0.00  0.00 - 0.08 ng/mL   Comment 3            Ct Head Wo Contrast  07/04/2013   CLINICAL DATA:  Right-sided numbness  EXAM: CT HEAD WITHOUT CONTRAST  TECHNIQUE: Contiguous axial images were obtained from the base of the skull through the vertex without intravenous contrast.  COMPARISON:  None.  FINDINGS: Skull and Sinuses:No significant abnormality.  Orbits: No acute abnormality.  Brain: No evidence of acute abnormality, such as acute large vessel infarction, hemorrhage, hydrocephalus, or mass lesion/mass effect. Generalized decrease in cerebral volume for age. Prominent sulcal spaces in the left occipital lobe. There is a focal subcortical white matter low density in the right frontal lobe, nonspecific in location and appearance.  IMPRESSION: 1. No evidence of large vessel infarct or hemorrhage. 2. Cerebral volume loss and white matter disease. The white matter changes could be from small vessel ischemia or demyelinating disease.   Electronically Signed   By: Audry RilesJonathan  Watts M.D.  On: 07/04/2013 01:12       Sunnie Nielsen, MD 07/04/13 [phone]

## 2013-07-04 NOTE — Progress Notes (Signed)
Patient ate a [place] sandwich and drank a can of ginger ale about 30 minutes ago, original diet order was for heart healthy and NPO order not seen until just now. Notified Dr. Onalee Huaavid to check and see if patient still needed to be NPO. She said that another MD had put in order, but that if patient had passed swallow screen and if he did not need to be NPO for any of his ordered tests, order could be changed back to heart healthy. Called ED RN to double check that patient had passed his stroke swallow screen: ED RN confirmed that patient had passed swallow screen. Called radiology to see if patient needed to be NPO for any of his tests he would be getting later today. Radiology tech said that she is not sure if NPO is required for MRIs with contrast, but it is recommended because sometimes contrast can cause patients to get sick during the tests. Informed patient of his NPO status and took away the rest of his water and ginger ale. Told patient we would confirm whether or not he needed to stay NPO and the times of his MRIs ASAP. Patient was calm and cooperative. Will continue to monitor.

## 2013-07-05 DIAGNOSIS — R7309 Other abnormal glucose: Secondary | ICD-10-CM | POA: Diagnosis present

## 2013-07-05 DIAGNOSIS — G35 Multiple sclerosis: Secondary | ICD-10-CM | POA: Diagnosis not present

## 2013-07-05 DIAGNOSIS — M6281 Muscle weakness (generalized): Secondary | ICD-10-CM

## 2013-07-05 DIAGNOSIS — W19XXXA Unspecified fall, initial encounter: Secondary | ICD-10-CM | POA: Diagnosis present

## 2013-07-05 DIAGNOSIS — R209 Unspecified disturbances of skin sensation: Secondary | ICD-10-CM | POA: Diagnosis present

## 2013-07-05 DIAGNOSIS — F17200 Nicotine dependence, unspecified, uncomplicated: Secondary | ICD-10-CM | POA: Diagnosis present

## 2013-07-05 DIAGNOSIS — H5500 Unspecified nystagmus: Secondary | ICD-10-CM | POA: Diagnosis present

## 2013-07-05 DIAGNOSIS — T380X5A Adverse effect of glucocorticoids and synthetic analogues, initial encounter: Secondary | ICD-10-CM | POA: Diagnosis present

## 2013-07-05 LAB — GLUCOSE, CAPILLARY
GLUCOSE-CAPILLARY: 125 mg/dL — AB (ref 70–99)
Glucose-Capillary: 191 mg/dL — ABNORMAL HIGH (ref 70–99)
Glucose-Capillary: 159 mg/dL — ABNORMAL HIGH (ref 70–99)
Glucose-Capillary: 161 mg/dL — ABNORMAL HIGH (ref 70–99)

## 2013-07-05 MED ORDER — LORAZEPAM 2 MG/ML IJ SOLN: 1.0000 mg | Freq: Three times a day (TID) | INTRAMUSCULAR | Status: DC

## 2013-07-05 MED ORDER — NICOTINE 21 MG/24HR TD PT24
21.0000 mg | MEDICATED_PATCH | Freq: Every day | TRANSDERMAL | Status: DC
  Administered 2013-07-06 – 2013-07-09 (×4): 21 mg via TRANSDERMAL
  Filled 2013-07-05 (×4): qty 1

## 2013-07-05 MED ORDER — LORAZEPAM 2 MG/ML IJ SOLN
1.0000 mg | Freq: Three times a day (TID) | INTRAMUSCULAR | Status: DC | PRN
  Administered 2013-07-06 – 2013-07-08 (×3): 1 mg via INTRAVENOUS
  Filled 2013-07-05 (×3): qty 1

## 2013-07-05 NOTE — Progress Notes (Signed)
Patient changed to inpatient- requiring IV solumedrol

## 2013-07-05 NOTE — Progress Notes (Signed)
Patient ID: Bobby Stafford, male   DOB: 01/31/1976, 38 y.o.   MRN: [id number]  [facility] PROGRESS NOTE  Bobby Stafford Bobby Stafford DOB: [birth year] DOA: 07/03/2013 PCP: No PCP Per Patient  Brief narrative: 38 yo male presented to [facility] ED with 4 days of right arm and leg numbness and weakness. Denies any n/t or facial disturbance. No slurred speech. No fevers. No rashes. No vision changes. Overall healthy, just got out of jail. No report of recent head injury. No recent illnesses.   Principal Problem:   Numbness on right side - secondary to MS as confirmed by imagining studies - management per neurology - continue steroids and supportive care with analgesia as needed Active Problems:   Tobacco use - provide nicotine patch    Hyperglycemia  - from steroids - monitor per protocol   Consultants:  Neurology   Procedures/Studies: Ct Head Wo Contrast   07/04/2013   No evidence of large vessel infarct or hemorrhage. 2. Cerebral volume loss and white matter disease. The white matter changes could be from small vessel ischemia or demyelinating disease. Bobby Stafford Wo Contrast   07/04/2013   Numerous supra- and infratentorial white matter lesions and cervical spinal cord lesions, consistent with demyelinating disease (multiple sclerosis). Numerous lesions demonstrate enhancement, consistent with active demyelination. Mild midcervical degenerative disc disease as above.    Antibiotics:  None  Code Status: Full Family Communication: Pt at bedside Disposition Plan: Home when medically stable  HPI/Subjective: No events overnight.   Objective: Filed Vitals:   07/04/13 2215 07/05/13 0206 07/05/13 0549 07/05/13 1437  BP: 129/70 114/64 114/60 135/65  Pulse: 65 57 75 78  Temp: 98.3 F (36.8 C) 98 F (36.7 C) 97.7 F (36.5 C) 98.7 F (37.1 C)  TempSrc: Oral Oral Oral Oral  Resp: 18 16 18 18   Height:      Weight:      SpO2: 99% 97% 98% 99%    Intake/Output Summary (Last 24 hours) at  07/05/13 1708 Last data filed at 07/05/13 1300  Gross per 24 hour  Intake    722 ml  Output      0 ml  Net    722 ml    Exam:   General:  Pt is alert, follows commands appropriately, not in acute distress  Cardiovascular: Regular rate and rhythm, S1/S2, no murmurs, no rubs, no gallops  Respiratory: Clear to auscultation bilaterally, no wheezing, no crackles, no rhonchi  Abdomen: Soft, non tender, non distended, bowel sounds present, no guarding  Extremities: No edema, pulses DP and PT palpable bilaterally  Data Reviewed: Basic Metabolic Panel:  Recent Labs Lab 07/03/13 1755 07/03/13 2253  NA 142 139  K 3.6* 3.8  CL 103 102  CO2 21 23  GLUCOSE 97 98  BUN 9 12  CREATININE 1.07 1.03  CALCIUM 9.7 9.1   Liver Function Tests:  Recent Labs Lab 07/03/13 2253  AST 16  ALT 17  ALKPHOS 45  BILITOT 1.2  PROT 7.4  ALBUMIN 4.0   CBC:  Recent Labs Lab 07/03/13 1755 07/03/13 2253  WBC 8.1 8.0  NEUTROABS 5.6 4.2  HGB 14.0 13.1  HCT 39.2 37.5*  MCV 93.6 93.5  PLT 244 240   CBG:  Recent Labs Lab 07/04/13 2223 07/05/13 0804 07/05/13 1156 07/05/13 1702  GLUCAP 106* 191* 159* 125*   Scheduled Meds: . aspirin  300 mg Rectal Daily   Or  . aspirin  325 mg Oral Daily  . LORazepam  1  mg Intravenous TID  . methylPREDNISolone (SOLU-MEDROL) injection  1,000 mg Intravenous Q24H  . nicotine  21 mg Transdermal Daily  . pantoprazole  40 mg Oral Q24H  . sodium chloride  3 mL Intravenous Q12H  . sodium chloride  3 mL Intravenous Q12H   Continuous Infusions:    Debbora PrestoMAGICK-Jahsiah Carpenter, MD  [facility] Pager [phone]  If 7PM-7AM, please contact night-coverage [email or web address] Password [email or web address] 07/05/2013, 5:08 PM   LOS: 2 days

## 2013-07-05 NOTE — Evaluation (Addendum)
Occupational Therapy Evaluation Patient Details Name: Bobby Stafford MRN: [id number] DOB: [birth year] Today's Date: 07/05/2013 Time: 0347-4259 OT Time Calculation (min): 15 min  OT Assessment / Plan / Recommendation History of present illness pt was admitted for numbness in RUE and RLE.  MRI consistent with MS   Clinical Impression   Pt was admitted for the above.  He will benefit from skilled OT to increase safety and independence with adls.  Goals in acute are for supervision level.  Pt needs continued education on compensating for sensory loss and to increase strength, coordination and use of RUE.      OT Assessment  Patient needs continued OT Services    Follow Up Recommendations  Home health OT (if pt reaches supervision/mod I level) HHOT, if pt reaches supervision/mod I level. Pt lives alone, but feels he can have someone check on him   Barriers to Discharge      Equipment Recommendations   (tba further) to be further assessed, ? 3:1/tub bench   Recommendations for Other Services    Frequency  Min 2X/week    Precautions / Restrictions Precautions Precautions: Fall Restrictions Weight Bearing Restrictions: No   Pertinent Vitals/Pain No c/o pain.  C/o numbness from R axilla to knee.  Stretching sensation with RUE abduction    ADL  Transfers/Ambulation Related to ADLs: sit to stand only with min A for balance (walker OT had was too short) ADL Comments: provided red foam for RUE to assist with cutting food, ball to squeeze and red theraputty    OT Diagnosis: Generalized weakness  OT Problem List: Decreased strength;Impaired sensation;Impaired UE functional use OT Treatment Interventions: Self-care/ADL training;DME and/or AE instruction;Therapeutic exercise;Therapeutic activities;Patient/family education   OT Goals(Current goals can be found in the care plan section) Acute Rehab OT Goals Patient Stated Goal: get R side stronger  OT Goal Formulation: With patient Time For  Goal Achievement: 07/19/13 Potential to Achieve Goals: Good ADL Goals Pt Will Perform Grooming: with supervision;standing Pt Will Perform Lower Body Bathing: with supervision;sit to/from stand Pt Will Perform Lower Body Dressing: with supervision;sit to/from stand Pt Will Transfer to Toilet: with supervision;ambulating;bedside commode;regular height toilet Pt Will Perform Toileting - Clothing Manipulation and hygiene: with supervision;sit to/from stand Pt Will Perform Tub/Shower Transfer: with supervision;Tub transfer;ambulating;tub bench Pt/caregiver will Perform Home Exercise Program: Right Upper extremity;Both right and left upper extremity;With theraputty;With written HEP provided;Independently;Increased strength (and coordination) Additional ADL Goal #1: pt will use RUE during bil activities without cues  Visit Information  Last OT Received On: 07/05/13 History of Present Illness: pt was admitted for numbness in RUE and RLE.  MRI consistent with MS       Prior Functioning     Home Living Family/patient expects to be discharged to:: Private residence Living Arrangements: Alone Additional Comments: can have friend come by.  Pt has a standard commode and tub.  He is 6'4" Prior Function Level of Independence: Independent Communication Communication: No difficulties Dominant Hand: Left         Vision/Perception Vision - History Patient Visual Report:  (c/o blurriness/normally wears non-rx reading glasses)   Cognition  Cognition Arousal/Alertness: Awake/alert Behavior During Therapy: WFL for tasks assessed/performed Overall Cognitive Status: Within Functional Limits for tasks assessed    Extremity/Trunk Assessment Upper Extremity Assessment Upper Extremity Assessment: RUE deficits/detail RUE Deficits / Details: RUE weakenss and decreased coordination. Feels tightness in R hand and R abductors when lifting above 120.  Instructed to keep thumb up when lifting arm RUE  Sensation:  (  impaired, able to feel deep pressure) RUE Coordination: decreased fine motor     Mobility Bed Mobility General bed mobility comments: Pt sitting EOB; NT Transfers Overall transfer level: Needs assistance Equipment used: None Transfers: Sit to/from Stand Sit to Stand: Min assist General transfer comment: decreased balance during sit to stand; would benefit from AD     Exercise Other Exercises Other Exercises: brought written HEP for R hand strength and coordination.  Pt worked through exercises but needs cues to keep R thumb extended for normal pinch and to perform slowly.   Other Exercises: return demonstrated R abduction with thumb up   Balance     End of Session OT - End of Session Activity Tolerance: Patient tolerated treatment well Patient left: in bed;with call bell/phone within reach  GO     Bobby Stafford 3/6/20[facility]15, 4:48 PM Marica OtterMaryellen Aminat [place], OTR/L [phone] 07/05/2013

## 2013-07-05 NOTE — Progress Notes (Signed)
07/05/13 1642  OT Visit Information  Last OT Received On 07/05/13  History of Present Illness pt was admitted for numbness in RUE and RLE.  MRI consistent with MS  Precautions  Precautions Fall  Restrictions  Weight Bearing Restrictions No  ADL  ADL Comments provided red foam for RUE to assist with cutting food, ball to squeeze and red theraputty  Cognition  Arousal/Alertness Awake/alert  Behavior During Therapy [facility] for tasks assessed/performed  Overall Cognitive Status Within Functional Limits for tasks assessed  Bed Mobility  Overal bed mobility Modified Independent  General bed mobility comments HOB raised  Other Exercises  Other Exercises brought written HEP for R hand strength and coordination.  Pt worked through exercises but needs cues to keep R thumb extended for normal pinch and to perform slowly.    Other Exercises return demonstrated R abduction with thumb up  OT - End of Session  Activity Tolerance Patient tolerated treatment well  OT Assessment/Plan  Follow Up Recommendations Home health OT (if pt reaches supervision/mod I level)  OT Equipment (tba further)  OT Goal Progression  Progress towards OT goals Progressing toward goals  OT General Charges  $OT Visit 1 Procedure  OT Treatments  $Therapeutic Exercise 8-22 mins  Marica Otter, OTR/L [phone] 07/05/2013

## 2013-07-05 NOTE — Progress Notes (Addendum)
NEURO HOSPITALIST PROGRESS NOTE   SUBJECTIVE:                                                                                                                         Still feels weak on his right arm ( abd and tricep extension) and right hip flexion.  Continues to have decreased sensation on his right arm.   OBJECTIVE:                                                                                                                           Vital signs in last 24 hours: Temp:  [97.5 F (36.4 C)-98.3 F (36.8 C)] 97.7 F (36.5 C) (03/06 0549) Pulse Rate:  [56-75] 75 (03/06 0549) Resp:  [16-18] 18 (03/06 0549) BP: (114-129)/(60-71) 114/60 mmHg (03/06 0549) SpO2:  [97 %-99 %] 98 % (03/06 0549)  Intake/Output from previous day: 03/05 0701 - 03/06 0700 In: 802 [P.O.:680; I.V.:6; IV Piggyback:116] Out: -  Intake/Output this shift:   Nutritional status: General  History reviewed. No pertinent past medical history.    Neurologic Exam:  Mental Status: Alert, oriented, thought content appropriate.  Speech fluent without evidence of aphasia.  Able to follow 3 step commands without difficulty. Cranial Nerves: II:  Visual fields grossly normal, pupils equal, round, reactive to light and accommodation III,IV, VI: ptosis not present, extra-ocular motions intact bilaterally V,VII: smile symmetric, facial light touch sensation normal bilaterally VIII: hearing normal bilaterally IX,X: gag reflex present XI: bilateral shoulder shrug XII: midline tongue extension without atrophy or fasciculations  Motor: Right : Upper extremity   4/5    Left:     Upper extremity   5/5  Lower extremity   4/5     Lower extremity   5/5 Tone and bulk:normal tone throughout; no atrophy noted Sensory: Pinprick and light touch intact decreased on the right arm Deep Tendon Reflexes:  Right: Upper Extremity   Left: Upper extremity   biceps (C-5 to C-6) 2/4   biceps (C-5 to  C-6) 2/4 tricep (C7) 2/4    triceps (C7) 2/4 Brachioradialis (C6) 2/4  Brachioradialis (C6) 2/4  Lower Extremity Lower Extremity  quadriceps (L-2 to L-4) 2/4   quadriceps (L-2 to L-4) 2/4 Achilles (  S1) 2/4   Achilles (S1) 2/4    Lab Results: Basic Metabolic Panel:  Recent Labs Lab 07/03/13 1755 07/03/13 2253  NA 142 139  K 3.6* 3.8  CL 103 102  CO2 21 23  GLUCOSE 97 98  BUN 9 12  CREATININE 1.07 1.03  CALCIUM 9.7 9.1    Liver Function Tests:  Recent Labs Lab 07/03/13 2253  AST 16  ALT 17  ALKPHOS 45  BILITOT 1.2  PROT 7.4  ALBUMIN 4.0   No results found for this basename: LIPASE, AMYLASE,  in the last 168 hours No results found for this basename: AMMONIA,  in the last 168 hours  CBC:  Recent Labs Lab 07/03/13 1755 07/03/13 2253  WBC 8.1 8.0  NEUTROABS 5.6 4.2  HGB 14.0 13.1  HCT 39.2 37.5*  MCV 93.6 93.5  PLT 244 240    Cardiac Enzymes: No results found for this basename: CKTOTAL, CKMB, CKMBINDEX, TROPONINI,  in the last 168 hours  Lipid Panel: No results found for this basename: CHOL, TRIG, HDL, CHOLHDL, VLDL, LDLCALC,  in the last 168 hours  CBG:  Recent Labs Lab 07/04/13 2223 07/05/13 0804  GLUCAP 106* 191*    Microbiology: No results found for this or any previous visit.  Coagulation Studies:  Recent Labs  07/03/13 2253  LABPROT 12.8  INR 0.98    Imaging: Ct Head Wo Contrast  07/04/2013   CLINICAL DATA:  Right-sided numbness  EXAM: CT HEAD WITHOUT CONTRAST  TECHNIQUE: Contiguous axial images were obtained from the base of the skull through the vertex without intravenous contrast.  COMPARISON:  None.  FINDINGS: Skull and Sinuses:No significant abnormality.  Orbits: No acute abnormality.  Brain: No evidence of acute abnormality, such as acute large vessel infarction, hemorrhage, hydrocephalus, or mass lesion/mass effect. Generalized decrease in cerebral volume for age. Prominent sulcal spaces in the left occipital lobe. There is a  focal subcortical white matter low density in the right frontal lobe, nonspecific in location and appearance.  IMPRESSION: 1. No evidence of large vessel infarct or hemorrhage. 2. Cerebral volume loss and white matter disease. The white matter changes could be from small vessel ischemia or demyelinating disease.   Electronically Signed   By: Tiburcio Pea M.D.   On: 07/04/2013 01:12   Mr Laqueta Jean XB Contrast  07/04/2013   CLINICAL DATA:  Sudden onset of right-sided weakness, numbness and tingling with fall. Patient unable to ambulate. White matter disease on head CT.  EXAM: MRI HEAD WITHOUT AND WITH CONTRAST  MRI CERVICAL SPINE WITHOUT AND WITH CONTRAST  TECHNIQUE: Multiplanar, multiecho pulse sequences of the brain and surrounding structures, and cervical spine, to include the craniocervical junction and cervicothoracic junction, were obtained without and with intravenous contrast.  CONTRAST:  20mL MULTIHANCE GADOBENATE DIMEGLUMINE 529 MG/ML IV SOLN  COMPARISON:  Head CT 07/03/2013  FINDINGS: MRI HEAD FINDINGS  Small, scattered foci of T2 shine through are noted on the diffusion-weighted images. There is no definite evidence of acute infarct. There is no evidence of intracranial hemorrhage. There are numerous (greater than 20) foci of T2 hyperintensity within the subcortical and periventricular cerebral white matter bilaterally. Many of these are oriented perpendicularly to the lateral ventricles. T2 hyperintense lesions are also present involving the left greater than right middle cerebellar peduncles, pons, and midbrain. Many lesions demonstrate prominent T1 hypointensity. Numerous lesions demonstrate enhancement, with larger lesions demonstrating predominantly ring-like enhancement. Largest enhancing lesion measures 9 mm in the right frontal lobe.  There  is mildly advanced cerebral atrophy for age. There is no midline shift or extra-axial fluid collection. Major intracranial vascular flow voids are preserved.  Orbits are unremarkable. There is mild anterior left ethmoid air cell mucosal thickening. Mastoid air cells are clear.  MRI CERVICAL SPINE FINDINGS  There is straightening of the normal cervical lordosis. Vertebral marrow signal is mildly diminished diffusely. No bone marrow edema or enhancing osseous lesions are identified. The cervical spinal cord is normal in caliber. There are multiple T2 hyperintense lesions throughout the cervical spinal cord. The largest lesion is at C3-4, it measures approximately 2 cm in craniocaudal length, and demonstrates predominantly peripheral enhancement. Additional enhancing lesions are present at C2, C5, and C6.  Uncovertebral hypertrophy results in mild to moderate right neural foraminal stenosis C4-5. There is a small right central disc protrusion at C5-6 with borderline spinal canal narrowing at this level. A 1.4 cm left level II B lymph node is noted.  IMPRESSION: 1. Numerous supra- and infratentorial white matter lesions and cervical spinal cord lesions, consistent with demyelinating disease (multiple sclerosis). Numerous lesions demonstrate enhancement, consistent with active demyelination. 2. Mild midcervical degenerative disc disease as above.   Electronically Signed   By: Sebastian Ache   On: 07/04/2013 15:48   Mr Cervical Spine W Wo Contrast  07/04/2013   CLINICAL DATA:  Sudden onset of right-sided weakness, numbness and tingling with fall. Patient unable to ambulate. White matter disease on head CT.  EXAM: MRI HEAD WITHOUT AND WITH CONTRAST  MRI CERVICAL SPINE WITHOUT AND WITH CONTRAST  TECHNIQUE: Multiplanar, multiecho pulse sequences of the brain and surrounding structures, and cervical spine, to include the craniocervical junction and cervicothoracic junction, were obtained without and with intravenous contrast.  CONTRAST:  20mL MULTIHANCE GADOBENATE DIMEGLUMINE 529 MG/ML IV SOLN  COMPARISON:  Head CT 07/03/2013  FINDINGS: MRI HEAD FINDINGS  Small, scattered foci of  T2 shine through are noted on the diffusion-weighted images. There is no definite evidence of acute infarct. There is no evidence of intracranial hemorrhage. There are numerous (greater than 20) foci of T2 hyperintensity within the subcortical and periventricular cerebral white matter bilaterally. Many of these are oriented perpendicularly to the lateral ventricles. T2 hyperintense lesions are also present involving the left greater than right middle cerebellar peduncles, pons, and midbrain. Many lesions demonstrate prominent T1 hypointensity. Numerous lesions demonstrate enhancement, with larger lesions demonstrating predominantly ring-like enhancement. Largest enhancing lesion measures 9 mm in the right frontal lobe.  There is mildly advanced cerebral atrophy for age. There is no midline shift or extra-axial fluid collection. Major intracranial vascular flow voids are preserved. Orbits are unremarkable. There is mild anterior left ethmoid air cell mucosal thickening. Mastoid air cells are clear.  MRI CERVICAL SPINE FINDINGS  There is straightening of the normal cervical lordosis. Vertebral marrow signal is mildly diminished diffusely. No bone marrow edema or enhancing osseous lesions are identified. The cervical spinal cord is normal in caliber. There are multiple T2 hyperintense lesions throughout the cervical spinal cord. The largest lesion is at C3-4, it measures approximately 2 cm in craniocaudal length, and demonstrates predominantly peripheral enhancement. Additional enhancing lesions are present at C2, C5, and C6.  Uncovertebral hypertrophy results in mild to moderate right neural foraminal stenosis C4-5. There is a small right central disc protrusion at C5-6 with borderline spinal canal narrowing at this level. A 1.4 cm left level II B lymph node is noted.  IMPRESSION: 1. Numerous supra- and infratentorial white matter lesions and cervical spinal  cord lesions, consistent with demyelinating disease  (multiple sclerosis). Numerous lesions demonstrate enhancement, consistent with active demyelination. 2. Mild midcervical degenerative disc disease as above.   Electronically Signed   By: Sebastian AcheAllen  Grady   On: 07/04/2013 15:48       MEDICATIONS                                                                                                                        Scheduled: . aspirin  300 mg Rectal Daily   Or  . aspirin  325 mg Oral Daily  . influenza vac split quadrivalent PF  0.5 mL Intramuscular Tomorrow-1000  . methylPREDNISolone (SOLU-MEDROL) injection  1,000 mg Intravenous Q24H  . nicotine  21 mg Transdermal Daily  . pantoprazole  40 mg Oral Q24H  . pneumococcal 23 valent vaccine  0.5 mL Intramuscular Tomorrow-1000  . sodium chloride  3 mL Intravenous Q12H  . sodium chloride  3 mL Intravenous Q12H    ASSESSMENT/PLAN:                                                                                                             38 YO male with newly diagnosed MS and MS flair causing right arm and leg weakness. S/P one dose of Solumedrol.   Recommend 1) continue Solumedrol 1000 mg daily for 3 days and protonix 2) PT/OT 3) will need follow up with neurology with in 1-2 weeks after discharge.  4) will need a prednisone taper after last dose of solumedrol. Prednisone starting at 60 mg decreasing by 10 mg Q day   Assessment and plan discussed with with attending physician and they are in agreement.    Felicie MornDavid Hollyanne Schloesser PA-C [facility] [phone]  07/05/2013, 9:44 AM

## 2013-07-06 LAB — GLUCOSE, CAPILLARY
Glucose-Capillary: 142 mg/dL — ABNORMAL HIGH (ref 70–99)
Glucose-Capillary: 147 mg/dL — ABNORMAL HIGH (ref 70–99)
Glucose-Capillary: 121 mg/dL — ABNORMAL HIGH (ref 70–99)
Glucose-Capillary: 124 mg/dL — ABNORMAL HIGH (ref 70–99)

## 2013-07-06 LAB — CBC
HCT: 38.6 % — ABNORMAL LOW (ref 39.0–52.0)
HEMOGLOBIN: 13.5 g/dL (ref 13.0–17.0)
MCH: 33.1 pg (ref 26.0–34.0)
MCHC: 35 g/dL (ref 30.0–36.0)
MCV: 94.6 fL (ref 78.0–100.0)
PLATELETS: 253 10*3/uL (ref 150–400)
RBC: 4.08 MIL/uL — AB (ref 4.22–5.81)
RDW: 11.9 % (ref 11.5–15.5)
WBC: 14.7 10*3/uL — ABNORMAL HIGH (ref 4.0–10.5)

## 2013-07-06 LAB — BASIC METABOLIC PANEL
BUN: 12 mg/dL (ref 6–23)
CO2: 25 mEq/L (ref 19–32)
Calcium: 9.6 mg/dL (ref 8.4–10.5)
Chloride: 103 mEq/L (ref 96–112)
Creatinine, Ser: 0.92 mg/dL (ref 0.50–1.35)
GFR calc Af Amer: >90 mL/min (ref >90)
GFR calc non Af Amer: >90 mL/min (ref >90)
Glucose, Bld: 144 mg/dL — ABNORMAL HIGH (ref 70–99)
POTASSIUM: 4.5 meq/L (ref 3.7–5.3)
SODIUM: 138 meq/L (ref 137–147)

## 2013-07-06 NOTE — Progress Notes (Signed)
Patient ID: Bobby Stafford, male   DOB: [birth year], 38 y.o.   MRN: [id number]  [facility] PROGRESS NOTE  Bobby BussingJason Parisien BJY:[id number] DOB: [birth year] DOA: 07/03/2013 PCP: No PCP Per Patient  Brief narrative:  38 yo male [facility] ED with 4 days of right arm and leg numbness and weakness. Denies any n/t or facial disturbance. No slurred speech. No fevers. No rashes. No vision changes. Overall healthy, just got out of jail. No report of recent head injury. No recent illnesses.   Principal Problem:  Numbness on right side  - secondary to MS as confirmed by imagining studies  - management per neurology  - continue steroids and supportive care with analgesia as needed  Active Problems:  Tobacco use  - provide nicotine patch  Hyperglycemia  - from steroids  - monitor per protocol   Consultants:  Neurology  Procedures/Studies:  Ct Head Wo Contrast 07/04/2013 No evidence of large vessel infarct or hemorrhage. 2. Cerebral volume loss and white matter disease. The white matter changes could be from small vessel ischemia or demyelinating disease.  [name] W Wo Contrast 07/04/2013 Numerous supra- and infratentorial white matter lesions and cervical spinal cord lesions, consistent with demyelinating disease (multiple sclerosis). Numerous lesions demonstrate enhancement, consistent with active demyelination. Mild midcervical degenerative disc disease as above.  Antibiotics:  None  Code Status: Full  Family Communication: Pt at bedside  Disposition Plan: Home when medically stable      HPI/Subjective: No events overnight.   Objective: Filed Vitals:   07/05/13 1437 07/05/13 2115 07/06/13 0439 07/06/13 1333  BP: 135/65 120/53 113/55 135/67  Pulse: 78 73 56 83  Temp: 98.7 F (37.1 C) 98.3 F (36.8 C) 97.9 F (36.6 C) 98.3 F (36.8 C)  TempSrc: Oral Oral Oral Oral  Resp: 18 20 18 18   Height:      Weight:      SpO2: 99% 96% 94% 96%    Intake/Output Summary (Last 24 hours) at  07/06/13 1458 Last data filed at 07/06/13 0846  Gross per 24 hour  Intake    778 ml  Output      0 ml  Net    778 ml    Exam:   General:  Pt is alert, follows commands appropriately, not in acute distress  Cardiovascular: Regular rate and rhythm, S1/S2, no murmurs, no rubs, no gallops  Respiratory: Clear to auscultation bilaterally, no wheezing, no crackles, no rhonchi  Abdomen: Soft, non tender, non distended, bowel sounds present, no guarding  Extremities: No edema, pulses DP and PT palpable bilaterally  Data Reviewed: Basic Metabolic Panel:  Recent Labs Lab 07/03/13 1755 07/03/13 2253 07/06/13 0426  NA 142 139 138  K 3.6* 3.8 4.5  CL 103 102 103  CO2 21 23 25   GLUCOSE 97 98 144*  BUN 9 12 12   CREATININE 1.07 1.03 0.92  CALCIUM 9.7 9.1 9.6   Liver Function Tests:  Recent Labs Lab 07/03/13 2253  AST 16  ALT 17  ALKPHOS 45  BILITOT 1.2  PROT 7.4  ALBUMIN 4.0   CBC:  Recent Labs Lab 07/03/13 1755 07/03/13 2253 07/06/13 0426  WBC 8.1 8.0 14.7*  NEUTROABS 5.6 4.2  --   HGB 14.0 13.1 13.5  HCT 39.2 37.5* 38.6*  MCV 93.6 93.5 94.6  PLT 244 240 253   CBG:  Recent Labs Lab 07/05/13 0804 07/05/13 1156 07/05/13 1702 07/05/13 2047 07/06/13 1145  GLUCAP 191* 159* 125* 161* 147*   Scheduled Meds: .  aspirin  300 mg Rectal Daily   Or  . aspirin  325 mg Oral Daily  . methylPREDNISolone (SOLU-MEDROL) injection  1,000 mg Intravenous Q24H  . nicotine  21 mg Transdermal Daily  . pantoprazole  40 mg Oral Q24H  . sodium chloride  3 mL Intravenous Q12H   Continuous Infusions:  Debbora Presto, MD  [facility] Pager [phone]  If 7PM-7AM, please contact night-coverage [email or web address] Password [email or web address] 07/06/2013, 2:58 PM   LOS: 3 days

## 2013-07-06 NOTE — Evaluation (Signed)
Physical Therapy Evaluation Patient Details Name: Bobby Stafford MRN: [id number] DOB: 1975/10/09 Today's Date: 07/06/2013 Time: 1478-2956 PT Time Calculation (min): 32 min  PT Assessment / Plan / Recommendation History of Present Illness  pt was admitted for numbness in RUE and RLE.  MRI consistent with MS  Clinical Impression  Pt presents with impaired sensation, decreased coordination/ ataxia of both Legs and trunk. Pt requires some UE support for ambulation, has decreased control Of R UE on RW. PT REPORTS HE HAS NO RUNNING WATER DUE TO PIPES BURST. Pt will benefit from PT to address problems. PT WOULD BENEFIT FROM CIR CONSULT TO IMPROVE SAFETY AND BALANCE.Marland Kitchen    PT Assessment  Patient needs continued PT services     Follow Up Recommendations  Home health PT;CIR    Does the patient have the potential to tolerate intense rehabilitation      Barriers to Discharge Decreased caregiver support      Equipment Recommendations  Rolling walker with 5" wheels    Recommendations for Other Services Rehab consult   Frequency Min 4X/week    Precautions / Restrictions Precautions Precautions: Fall   Pertinent Vitals/Pain Pt reports back and leg stiffness.      Mobility  Bed Mobility Overal bed mobility: Modified Independent Transfers Overall transfer level: Needs assistance Equipment used: None;Rolling walker (2 wheeled) Transfers: Sit to/from Stand Sit to Stand: Min assist;From elevated surface General transfer comment: decreased balance during sit to stand; noted trunk instability upon standing. Ambulation/Gait Ambulation/Gait assistance: Min assist Ambulation Distance (Feet): 200 Feet Assistive device: Rolling walker (2 wheeled) Gait Pattern/deviations: Step-through pattern;Staggering left;Staggering right;Drifts right/left;Wide base of support;Ataxic;Decreased dorsiflexion - right Gait velocity: decreased General Gait Details: pt amb. x 200' with RWwith noted decreased control  of swing(R>L), R leg swings out wider than base . Pt  staggers , much more pronounced dyscoordination  without RW, hand  rail. noted trunk  decreased control.     Exercises General Exercises - Lower Extremity Ankle Circles/Pumps: AROM;Both Long Arc Quad: AROM;Both Hip ABduction/ADduction: AROM;Both Hip Flexion/Marching: AROM;Both Toe Raises: AROM;Both Heel Raises: AROM;Both Other Exercises Other Exercises: instructed to perform exercises for coordination and control of movements for legs Other Exercises: sitting core control    PT Diagnosis: Abnormality of gait;Other (comment) (ataxia)  PT Problem List: Decreased strength;Decreased activity tolerance;Decreased balance;Decreased mobility;Decreased knowledge of precautions;Decreased safety awareness;Decreased knowledge of use of DME;Decreased coordination;Impaired sensation;Pain PT Treatment Interventions: DME instruction;Gait training;Stair training;Functional mobility training;Therapeutic activities;Therapeutic exercise;Patient/family education;Neuromuscular re-education;Balance training     PT Goals(Current goals can be found in the care plan section) Acute Rehab PT Goals 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 PT Goal Formulation: With patient Time For Goal Achievement: 07/20/13 Potential to Achieve Goals: Good  Visit Information  Last PT Received On: 07/06/13 Assistance Needed: +1 History of Present Illness: pt was admitted for numbness in RUE and RLE.  MRI consistent with MS       Prior Functioning  Home Living Family/patient expects to be discharged to:: Private residence Living Arrangements: Alone Available Help at Discharge: Friend(s) Type of Home: House Home Access: Stairs to enter [employer] of Steps: 5 Entrance Stairs-Rails: Right;Left Home Layout: One level Home Equipment: None Additional Comments: can have friend come by, CURRENTLY HAS NO RUNNING WATER- PIPES FROZE. states he can go to  friends house. Prior Function Level of Independence: Independent Communication Communication: No difficulties Dominant Hand: Left    Cognition  Cognition Arousal/Alertness: Awake/alert Behavior During Therapy: WFL for tasks assessed/performed Overall Cognitive  Status: Within Functional Limits for tasks assessed    Extremity/Trunk Assessment Upper Extremity Assessment Upper Extremity Assessment: Defer to OT evaluation RUE Deficits / Details: RUE weakenss and decreased coordination. Feels tightness in R hand and R abductors when lifting above 120.  Instructed to keep thumb up when lifting arm RUE Coordination: decreased fine motor Lower Extremity Assessment Lower Extremity Assessment: RLE deficits/detail;LLE deficits/detail RLE Deficits / Details: reports tingling, shooting sensation lower leg and foot.  knee extension 3/5- breaks to resistance; hip flexion 3/5; knee flexion 3/5; dorsiflexion 2+.DECREASED COORDINATION FOR RRRM, AM OF HIP, KNEE AND ANKLE.NOTED ATAXIA PRESENT. RLE Sensation: decreased light touch;decreased proprioception RLE Coordination: decreased fine motor;decreased gross motor LLE Deficits / Details: presents with more control than R but does demonstrate ataxia, strength 4/5 hip, knee. Dorsiflexion 3+/5 LLE Sensation: decreased light touch;decreased proprioception LLE Coordination: decreased fine motor;decreased gross motor Cervical / Trunk Assessment Cervical / Trunk Assessment: Other exceptions Cervical / Trunk Exceptions: noted trunkal sway/titubations when sitting statically. trunkd tends to  be in lordosis whensitting.   Balance Balance Overall balance assessment: Needs assistance Sitting-balance support: No upper extremity supported;Feet supported Sitting balance-Leahy Scale: Fair Sitting balance - Comments: noted trunkal titubations in sitting static, unable to control , improved when accentuated lumbar  control, more erect posture.  Standing balance support:  No upper extremity supported;During functional activity Standing balance-Leahy Scale: Fair Standing balance comment: wide base of support when standing. noted R knee tends to hyper extend in stance and gait. Unable to attempt higher level unilateral stance  End of Session PT - End of Session Equipment Utilized During Treatment: Gait belt Activity Tolerance: Patient tolerated treatment well Patient left: in bed;with call bell/phone within reach Nurse Communication: Mobility status  GP     Rada HayHill, Meris Reede Elizabeth 07/06/2013, 9:27 AM Blanchard KelchKaren Heide Brossart PT [phone]

## 2013-07-06 NOTE — Progress Notes (Signed)
Occupational Therapy Treatment Patient Details Name: Bobby Stafford MRN: [id number] DOB: Dec 03, 1975 Today's Date: 07/06/2013 Time: 4098-1191 OT Time Calculation (min): 28 min  OT Assessment / Plan / Recommendation  History of present illness pt was admitted for numbness in RUE and RLE.  MRI consistent with MS   OT comments  Making good progress in OT with RUE.  Pt does have ataxic movement. Would benefit from CIR to maximize safety or assistance at home whenever he is up and moving.  Follow Up Recommendations  Home health OT;Supervision/Assistance - 24 hour;CIR (vs)    Barriers to Discharge       Equipment Recommendations   (HHOT to assess space for sturdy tub transfers bench)    Recommendations for Other Services    Frequency Min 2X/week   Progress towards OT Goals Progress towards OT goals: Progressing toward goals  Plan Discharge plan needs to be updated    Precautions / Restrictions Precautions Precautions: Fall Restrictions Weight Bearing Restrictions: No   Pertinent Vitals/Pain C/o tightness ribcage, R side and R hand.  No pain reported    ADL  Grooming: Teeth care;Supervision/safety Where Assessed - Grooming: Supported standing Toilet Transfer: Minimal assistance Statistician Method: Sit to Barista: Comfort height toilet Transfers/Ambulation Related to ADLs: cues for safety and hand placement for sit to stand on walker ADL Comments: pt states his toilets are similar to height of hospitals.  Ataxic movement present during ambulation and also when reaching for things.  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  Visit Information  Last OT Received On: 07/06/13 Assistance Needed: +1 History of  Present Illness: pt was admitted for numbness in RUE and RLE.  MRI consistent with MS    Subjective Data      Prior Functioning  Home Living Family/patient expects to be discharged to:: Private residence Living Arrangements: Alone Available Help at Discharge: Friend(s) Type of Home: House Home Access: Stairs to enter [employer] of Steps: 5 Entrance Stairs-Rails: Right;Left Home Layout: One level Home Equipment: None Additional Comments: can have friend come by, CURRENTLY HAS NO RUNNING WATER- PIPES FROZE. states he can go to friends house. Prior Function Level of Independence: Independent Communication Communication: No difficulties Dominant Hand: Left    Cognition  Cognition Arousal/Alertness: Awake/alert Behavior During Therapy: WFL for tasks assessed/performed Overall Cognitive Status: Within Functional Limits for tasks assessed    Mobility  Bed Mobility Overal bed mobility: Modified Independent Transfers Overall transfer level: Needs assistance Equipment used: None;Rolling walker (2 wheeled) Transfers: Sit to/from Stand Sit to Stand: Min assist General transfer comment: for safety.  cues for hand placement when pushing up to use RW    Exercises   Other Exercises Other Exercises: reviewed HEP.  Practied bil activities:  tying gown on lap, donning sock--using RUE better Other Exercises: picked up objects with RUE, held 10 seconds and placed in different location.  Ataxia present when reaching.  Pt carrying over with strategy to keep palm under object when possible Other Exercises: worked on intrinsics and opposition. Cues to keep IP of thumb extended when grasping objects and to slow down to work on control Other Exercises: Pt feels RUE is tight.  Hand appears  larger than L in size, ? edema.  Educated on and performed retrograde massage   Balance   End of Session OT - End of Session Activity Tolerance: Patient tolerated treatment well Patient left: in  bed;with call bell/phone within reach  GO     [facility] Center[facility],Donaldo Teegarden 07/06/2013, 10:56 AM Marica OtterMaryellen Edyth Glomb, OTR/L [phone] 07/06/2013

## 2013-07-07 DIAGNOSIS — G35 Multiple sclerosis: Principal | ICD-10-CM

## 2013-07-07 LAB — GLUCOSE, CAPILLARY
GLUCOSE-CAPILLARY: 134 mg/dL — AB (ref 70–99)
Glucose-Capillary: 130 mg/dL — ABNORMAL HIGH (ref 70–99)
Glucose-Capillary: 114 mg/dL — ABNORMAL HIGH (ref 70–99)
Glucose-Capillary: 117 mg/dL — ABNORMAL HIGH (ref 70–99)

## 2013-07-07 MED ORDER — SODIUM CHLORIDE 0.9 % IV SOLN
1000.0000 mg | INTRAVENOUS | Status: AC
  Administered 2013-07-07 – 2013-07-08 (×2): 1000 mg via INTRAVENOUS
  Filled 2013-07-07 (×2): qty 8

## 2013-07-07 NOTE — Progress Notes (Signed)
Patient ID: Hilton Sofield, male   DOB: 31-Oct-1975, 38 y.o.   MRN: [id number]  [facility] PROGRESS NOTE  Kali Dang [id number]:[id number] DOB: 03-06-1976 DOA: 07/03/2013 PCP: No PCP Per Patient  Brief narrative:  38 yo male presented to [facility] ED with 4 days of right arm and leg numbness and weakness. Denies any n/t or facial disturbance. No slurred speech. No fevers. No rashes. No vision changes. Overall healthy, just got out of jail. No report of recent head injury. No recent illnesses.   Principal Problem:  Numbness on right side  - secondary to MS as confirmed by imagining studies  - management per neurology  - continue steroids and supportive care with analgesia as needed  - pt responding well but still weak on the right side  - ambulate today  Active Problems:  Tobacco use  - provide nicotine patch  Hyperglycemia  - from steroids  - monitor per protocol   Consultants:  Neurology  Procedures/Studies:  Ct Head Wo Contrast 07/04/2013 No evidence of large vessel infarct or hemorrhage. 2. Cerebral volume loss and white matter disease. The white matter changes could be from small vessel ischemia or demyelinating disease.  Mr Laqueta Jean Wo Contrast 07/04/2013 Numerous supra- and infratentorial white matter lesions and cervical spinal cord lesions, consistent with demyelinating disease (multiple sclerosis). Numerous lesions demonstrate enhancement, consistent with active demyelination. Mild midcervical degenerative disc disease as above.  Antibiotics:  None  Code Status: Full  Family Communication: Pt at bedside  Disposition Plan: Home when done with IV steroids   HPI/Subjective: No events overnight.   Objective: Filed Vitals:   07/06/13 1333 07/06/13 2217 07/07/13 0602 07/07/13 1318  BP: 135/67 129/72 138/70 149/71  Pulse: 83 62 56 90  Temp: 98.3 F (36.8 C) 98 F (36.7 C) 97.7 F (36.5 C)   TempSrc: Oral Oral Oral Oral  Resp: 18 20 16 18   Height:      Weight:      SpO2: 96% 96%  99% 94%    Intake/Output Summary (Last 24 hours) at 07/07/13 1349 Last data filed at 07/07/13 1332  Gross per 24 hour  Intake    648 ml  Output      0 ml  Net    648 ml    Exam:   General:  Pt is alert, follows commands appropriately, not in acute distress  Cardiovascular: Regular rate and rhythm, S1/S2, no murmurs, no rubs, no gallops  Respiratory: Clear to auscultation bilaterally, no wheezing, no crackles, no rhonchi  Abdomen: Soft, non tender, non distended, bowel sounds present, no guarding  Extremities: No edema, pulses DP and PT palpable bilaterally   Data Reviewed: Basic Metabolic Panel:  Recent Labs Lab 07/03/13 1755 07/03/13 2253 07/06/13 0426  NA 142 139 138  K 3.6* 3.8 4.5  CL 103 102 103  CO2 21 23 25   GLUCOSE 97 98 144*  BUN 9 12 12   CREATININE 1.07 1.03 0.92  CALCIUM 9.7 9.1 9.6   Liver Function Tests:  Recent Labs Lab 07/03/13 2253  AST 16  ALT 17  ALKPHOS 45  BILITOT 1.2  PROT 7.4  ALBUMIN 4.0   No results found for this basename: LIPASE, AMYLASE,  in the last 168 hours No results found for this basename: AMMONIA,  in the last 168 hours CBC:  Recent Labs Lab 07/03/13 1755 07/03/13 2253 07/06/13 0426  WBC 8.1 8.0 14.7*  NEUTROABS 5.6 4.2  --   HGB 14.0 13.1 13.5  HCT 39.2  37.5* 38.6*  MCV 93.6 93.5 94.6  PLT 244 240 253   CBG:  Recent Labs Lab 07/06/13 1145 07/06/13 1708 07/06/13 2214 07/07/13 0713 07/07/13 1143  GLUCAP 147* 121* 124* 130* 134*    No results found for this or any previous visit (from the past 240 hour(s)).   Scheduled Meds: . aspirin  300 mg Rectal Daily   Or  . aspirin  325 mg Oral Daily  . methylPREDNISolone (SOLU-MEDROL) injection  1,000 mg Intravenous Q24H  . nicotine  21 mg Transdermal Daily  . pantoprazole  40 mg Oral Q24H  . sodium chloride  3 mL Intravenous Q12H   Continuous Infusions:  Debbora PrestoMAGICK-MYERS, ISKRA, MD  [facility] Pager [phone]  If 7PM-7AM, please contact  night-coverage [email or web address] Password [email or web address] 07/07/2013, 1:49 PM   LOS: 4 days

## 2013-07-07 NOTE — Progress Notes (Signed)
Subjective: Patient is noticing improvement in strength and use of his right extremities. He had no new complaints.  Objective: Current vital signs: BP 149/71  Pulse 90  Temp(Src) 97.7 F (36.5 C) (Oral)  Resp 18  Ht 6' 4.5" (1.943 m)  Wt 132 kg (291 lb 0.1 oz)  BMI 34.96 kg/m2  SpO2 94%  Neurologic Exam: Alert and in no acute distress. Mental status was normal. No facial weakness noted. Mild weakness proximally and moderate intrinsic hand muscle weakness noted on the right. Normal strength proximally and distally of left upper extremity. Mild weakness with 4/5 strength of right hip flexors and 5-/5 strength of right hamstrings and tibialis anterior muscles. Coordination of right upper extremities was slightly impaired; moderately impaired involving right lower extremity with heel-to-shin testing.  Medications: I have reviewed the patient's current medications.  Assessment/Plan: MS exacerbation with signs of improvement with high-dose steroids IV for 3 days. Recommend continuing daily Solu-Medrol 1 g IV for additional 2 days (total of 5 treatments). Physical therapy and occupational therapy intervention to continue. Patient may need inpatient rehabilitation prior to being able to return home.  We will continue to follow this patient with you.  C.R. Roseanne Reno, MD [facility] [phone]  07/07/2013  2:17 PM

## 2013-07-07 NOTE — Progress Notes (Signed)
Rehab Admissions Coordinator Note:  Patient was screened by Clois Dupes for appropriateness for an Inpatient Acute Rehab Consult.  At this time, we are recommending per PT and OT recommendations this weekend. Please order if you agree.Clois Dupes 07/07/2013, 5:46 PM  I can be reached at [phone].

## 2013-07-07 NOTE — Progress Notes (Signed)
Occupational Therapy Treatment Patient Details Name: Bobby Stafford MRN: [id number] DOB: 1976/04/09 Today's Date: 07/07/2013 Time: 9983-3825 OT Time Calculation (min): 28 min  OT Assessment / Plan / Recommendation  History of present illness pt was admitted for numbness in RUE and RLE.  MRI consistent with MS   OT comments  Making progress with OT.  Ataxic when walking and some with RUE gross/fine motor but arm improving  Follow Up Recommendations  Home health OT;Supervision/Assistance - 24 hour--pt is trying to arrange for help at home--falls risk;CIR.   Barriers to Discharge       Equipment Recommendations   (HHOT to assess for tub bench)    Recommendations for Other Services    Frequency Min 2X/week   Progress towards OT Goals Progress towards OT goals: Progressing toward goals  Plan      Precautions / Restrictions Precautions Precautions: Fall Restrictions Weight Bearing Restrictions: No   Pertinent Vitals/Pain No pain reported.  Continues to feel tightness in R hand and side    ADL  Toilet Transfer: Hydrographic surveyor Method: Sit to Barista: Comfort height toilet Toileting - Clothing Manipulation and Hygiene: Min guard Where Assessed - Toileting Clothing Manipulation and Hygiene: Sit to stand from 3-in-1 or toilet Tub/Shower Transfer: Simulated;Min guard Tub/Shower Transfer Method: Science writer: Counsellor Used: Rolling walker Transfers/Ambulation Related to ADLs: ambulated to bathroom.  Needs reinforcement for safety with RW.  Educated on reaching for items with RW but did not practice this session.  Brought rubbermaid/carex tub bench and had him practice this to simulate tub.  He is not sure if he will have adequate room in his bathroom.  Recommended he sponge bathe if he cannot use tub bench ADL Comments: worked on [employer].  Pt continues to need supervision for putty with opposition as thumb  tends to flex while performing pinch to each finger.  Also active ROM for opposition, pt having difficulty reaching pad of 5th finger.  He was able to open creamer and pick up and move small objects on table with RUE.  (Pt is L dominant).  Continues to perform AROM for R shoulder to stretch abductors    OT Diagnosis:    OT Problem List:   OT Treatment Interventions:     OT Goals(current goals can now be found in the care plan section)    Visit Information  Last OT Received On: 07/07/13 Assistance Needed: +1 History of Present Illness: pt was admitted for numbness in RUE and RLE.  MRI consistent with MS    Subjective Data      Prior Functioning       Cognition  Cognition Arousal/Alertness: Awake/alert Behavior During Therapy: WFL for tasks assessed/performed Overall Cognitive Status: Within Functional Limits for tasks assessed    Mobility  Bed Mobility Overal bed mobility: Modified Independent General bed mobility comments: hob raised Transfers Sit to Stand: Supervision General transfer comment: cues for hand placement and safety    Exercises  Other Exercises Other Exercises: intermittent ataxia:  reached with bil UEs for objects in all directions from eob Other Exercises: performed HEP:  cues for watching thumb during opposition activities as it tends to flex Other Exercises: continues to feel tightness in RUE.  Cues given for retrograde massage (not to squeeze back towards fingertips   Balance    End of Session OT - End of Session Activity Tolerance: Patient tolerated treatment well Patient left: in bed;with call bell/phone within reach  GO     [name] 07/07/2013, 11:46 AM Marica OtterMaryellen Broderick Fonseca, OTR/L [phone] 07/07/2013

## 2013-07-08 LAB — GLUCOSE, CAPILLARY
GLUCOSE-CAPILLARY: 147 mg/dL — AB (ref 70–99)
GLUCOSE-CAPILLARY: 153 mg/dL — AB (ref 70–99)
Glucose-Capillary: 118 mg/dL — ABNORMAL HIGH (ref 70–99)
Glucose-Capillary: 123 mg/dL — ABNORMAL HIGH (ref 70–99)

## 2013-07-08 MED ORDER — POLYETHYLENE GLYCOL 3350 17 G PO PACK
17.0000 g | PACK | Freq: Every day | ORAL | Status: DC
  Administered 2013-07-08 – 2013-07-09 (×2): 17 g via ORAL
  Filled 2013-07-08 (×2): qty 1

## 2013-07-08 MED ORDER — SENNA 8.6 MG PO TABS
1.0000 | ORAL_TABLET | Freq: Two times a day (BID) | ORAL | Status: DC
  Administered 2013-07-08 – 2013-07-09 (×3): 8.6 mg via ORAL
  Filled 2013-07-08 (×3): qty 1

## 2013-07-08 NOTE — Progress Notes (Signed)
CSW spoke with patient as requested by RN, Elita Quick re: court date. Patient has court on Wednesday, 3/11 @ 8:30am and states that he really needs to be there for it. Dr. Izola Price made aware.   Patient also asked about Medicaid - CSW made Jasmine December (Artist [phone]) aware.   Lincoln Maxin, LCSW [facility] Clinical Social Worker cell #: [phone]

## 2013-07-08 NOTE — Progress Notes (Signed)
Occupational Therapy Treatment Patient Details Name: Bobby Stafford MRN: [id number] DOB: 1976-01-03 Today's Date: 07/08/2013 Time: 3086-5784 OT Time Calculation (min): 35 min  OT Assessment / Plan / Recommendation  History of present illness pt was admitted for numbness in RUE and RLE.  MRI consistent with MS   OT comments  Pt did well with incorporating R UE during functional ADL at the sink. He attempted to brush his teeth briefly with R UE even though he is left handed and also manipulated the toothpaste tube with his R hand.  He squeezed the washcloth out with his R hand also. He will benefit from OT to increase strength and independence for d/c next venue. Pt reports not having any assist available at home so feel he will need post acute rehab prior to return home for safety.    Follow Up Recommendations  CIR;Supervision/Assistance - 24 hour;Other (comment) (pt states he doesnt have 24/7 help at home.) If pt does d/c home will need HHOT.    Barriers to Discharge       Equipment Recommendations   (if home, HH to assess tub DME)    Recommendations for Other Services    Frequency Min 2X/week   Progress towards OT Goals Progress towards OT goals: Progressing toward goals  Plan Discharge plan needs to be updated    Precautions / Restrictions Precautions Precautions: Fall Restrictions Weight Bearing Restrictions: No   Pertinent Vitals/Pain 5/10 back pain; reposition, rest. Pt states he had pain meds earlier.    ADL  Grooming: Performed;Teeth care;Min guard. Pt did well with incorporating use of R UE on his own.  Where Assessed - Grooming: Supported standing Equipment Used: Rolling walker Transfers/Ambulation Related to ADLs: Min guard assist with the walker for safety to transfer into the bathroom for functional mobility as he tends to move alittle quickly wth the walker.  ADL Comments: Pt does tend to move quickly at times with walker so gave a verbal cue to take his time.  Discussed use of the reacher to help with retrieving objects especially down under the bed, or tight spaces to decrease fall risk. Pt states he thinks a reacher would be helpful. He practiced using the reacher to reach down at feet to pick up an object but had to use L hand to manipulate the reacher. Didnt practice with standing and using the reacher yet. Reviewed HEP and reinforced technique and how often to work on exercises as pt stating that from squeezing the ball so much last night his hand feels like it is cramping up. Discussed not squeezing the ball througout television show but during commercials for 10 reps . Discussed doing theraputty exercises several times a day. Pt able to redemonstrate massage for R hand and some of theraputty exercises. Reviewed the rest of the putty exercises on the handout and gave min verbal cues on proper technique. Discussed more R UE abduction exercises options including sitting on EOB to reach UE out to side and up as well as sidelying and reaching overhead. Pt performed X 10 R shoulder abduction. Needs supervision for putty still for continued focus on opposition of thumb with thumb straight.  Pt also performed at least 10 reps of touching each digit to thumb without putty and focusing on accuracy of target.     OT Diagnosis:    OT Problem List:   OT Treatment Interventions:     OT Goals(current goals can now be found in the care plan section)    Visit  Information  Last OT Received On: 07/08/13 Assistance Needed: +1 History of Present Illness: pt was admitted for numbness in RUE and RLE.  MRI consistent with MS    Subjective Data      Prior Functioning       Cognition  Cognition Arousal/Alertness: Awake/alert Behavior During Therapy: WFL for tasks assessed/performed Overall Cognitive Status: Within Functional Limits for tasks assessed    Mobility  Bed Mobility Overal bed mobility: Modified Independent Transfers Overall transfer level: Needs  assistance Equipment used: Rolling walker (2 wheeled) Transfers: Sit to/from Stand Sit to Stand: Supervision General transfer comment: cues for hand placement and safety    Exercises      Balance    End of Session OT - End of Session Equipment Utilized During Treatment: Rolling walker Activity Tolerance: Patient tolerated treatment well Patient left:  (at EOB for nursing tech to help with bath)  GO     Bobby Stafford, Bobby Stafford [phone] 07/08/2013, 1:09 PM

## 2013-07-08 NOTE — Consult Note (Signed)
Physical Medicine and Rehabilitation Consult Reason for Consult: MS  Flare--new diagnosis. Referring Physician: Dr. Izola Price   HPI: Bobby Stafford is a 38 y.o. male admitted on 07/03/13 with few day 3-4 day history of numbness and tingling RUE/RLE progressing to LLE weakness with fall and inability to walk. MRI brain/cervical spine with numerous supra- and infratentorial white matter lesions and cervical spinal cord lesions, consistent with demyelinating disease (multiple sclerosis) and numerous lesions with enhancement c/w active demyelination as well as mild midcervical DDD . UDS positive for opiates and THC. Patient with reports of numbness and weakness on the right side in the past. He was started on IV solumedrol for likely MS flare. Neurology recommends solumedrol x 3 days followed by oral taper. Patient with BLE ataxia as well as sensory deficits. Rehab team/MD recommending CIR   ROS  History reviewed. No pertinent past medical history.  History reviewed. No pertinent past surgical history.  Family History  Problem Relation Age of Onset  . Diabetes type I Father    Social History:  Lives alone.  Recently released from prison. Per reports that he has been smoking Cigarettes.  He has a 18 pack-year smoking history. He has never used smokeless tobacco. He reports that he drinks about 12.0 ounces of alcohol per week. He reports that he uses illicit drugs (Marijuana) about 7 times per week.  Allergies: No Known Allergies  No prescriptions prior to admission    Home: Home Living Family/patient expects to be discharged to:: Private residence Living Arrangements: Alone Available Help at Discharge: Friend(s) Type of Home: House Home Access: Stairs to enter Secretary/administrator of Steps: 5 Entrance Stairs-Rails: Right;Left Home Layout: One level Home Equipment: None Additional Comments: can have friend come by, CURRENTLY HAS NO RUNNING WATER- PIPES FROZE. states he can go to  friends house.  Functional History:   Functional Status:  Mobility:     Ambulation/Gait Ambulation Distance (Feet): 200 Feet Gait velocity: decreased General Gait Details: pt amb. x 200' with RWwith noted decreased control of swing(R>L), R leg swings out wider than base . Pt  staggers , much more pronounced dyscoordination  without RW, hand  rail. noted trunk  decreased control.     ADL: ADL Grooming: Performed;Teeth care;Min guard Where Assessed - Grooming: Supported standing Toilet Transfer: Hydrographic surveyor Method: Sit to Barista: Comfort height toilet Tub/Shower Transfer: Landscape architect Method: Science writer: Counsellor Used: Rolling walker Transfers/Ambulation Related to ADLs: Min guard/supervision with the walker for safety to transfer into the bathroom for functional mobility as he tends to move alittle quickly wth the walker.  ADL Comments: Pt does tend to move quickly at times with walker so gave a verbal cue to take his time. Discussed use of the reacher to help with retrieving objects especially down under the bed, or tight spaces to decrease fall risk. Pt states he thinks a reacher would be helpful. He practiced using the reacher to reach down at feet to pick up an object but had to use L hand manipulate the reacher. Didnt practice with standing and using the reacher yet. Reviewed HEP and reinforced technique and how often to work on exercises as pt stating that from squeezing the ball so much last night his hand feels like it is cramping up. Discussed not squeezing the ball througout television show but during commercials for 10 reps . Discussed doing theraputty exercises several times a day.  Pt able to redemonstrate massage for R hand and some of theraputty exercises. Reviewed the rest of the putty exercises on the handout and gave min verbal cues on proper technique.  Discussed more R UE abduction exercises options including sitting on EOB to reach UE out to side and up as well as sidelying and reaching overhead. Pt performed X 10 R shoulder abduction. Needs supervision for putty still for continued focus on opposition of thumb with thumb straight.  Pt also performed at least 10 reps of touching each digit to thumb without putty and focusing on accuracy of target.   Cognition: Cognition Overall Cognitive Status: Within Functional Limits for tasks assessed Orientation Level: Oriented X4 Cognition Arousal/Alertness: Awake/alert Behavior During Therapy: WFL for tasks assessed/performed Overall Cognitive Status: Within Functional Limits for tasks assessed  Blood pressure 115/55, pulse 51, temperature 98.3 F (36.8 C), temperature source Oral, resp. rate 18, height 6' 4.5" (1.943 m), weight 132 kg (291 lb 0.1 oz), SpO2 96.00%. Physical Exam  Results for orders placed during the hospital encounter of 07/03/13 (from the past 24 hour(s))  GLUCOSE, CAPILLARY     Status: Abnormal   Collection Time    07/07/13  4:34 PM      Result Value Ref Range   Glucose-Capillary 114 (*) 70 - 99 mg/dL  GLUCOSE, CAPILLARY     Status: Abnormal   Collection Time    07/07/13  9:51 PM      Result Value Ref Range   Glucose-Capillary 117 (*) 70 - 99 mg/dL  GLUCOSE, CAPILLARY     Status: Abnormal   Collection Time    07/08/13  7:49 AM      Result Value Ref Range   Glucose-Capillary 118 (*) 70 - 99 mg/dL  GLUCOSE, CAPILLARY     Status: Abnormal   Collection Time    07/08/13 11:42 AM      Result Value Ref Range   Glucose-Capillary 123 (*) 70 - 99 mg/dL   No results found.  Assessment/Plan: Diagnosis: MS 1. Does the need for close, 24 hr/day medical supervision in concert with the patient's rehab needs make it unreasonable for this patient to be served in a less intensive setting? No 2. Co-Morbidities requiring supervision/potential complications: na 3. Due to safety and  disease management, does the patient require 24 hr/day rehab nursing? No 4. Does the patient require coordinated care of a physician, rehab nurse, PT, OT to address physical and functional deficits in the context of the above medical diagnosis(es)? No Addressing deficits in the following areas: balance, bathing, feeding and cognition 5. Can the patient actively participate in an intensive therapy program of at least 3 hrs of therapy per day at least 5 days per week? No 6. The potential for patient to make measurable gains while on inpatient rehab is fair 7. Anticipated functional outcomes upon discharge from inpatient rehab are na with PT, na with OT, na with SLP. 8. Estimated rehab length of stay to reach the above functional goals is: na 9. Does the patient have adequate social supports to accommodate these discharge functional goals? Potentially 10. Anticipated D/C setting: Home 11. Anticipated post D/C treatments: HH therapy 12. Overall Rehab/Functional Prognosis: good  RECOMMENDATIONS: This patient's condition is appropriate for continued rehabilitative care [facility] the [facility] Patient [facility] agreed to participate in recommended program. Yes Note that insurance prior authorization may be required for reimbursement for recommended care.  Comment: Pt is at a contact guard assist level with PT. Cannot  justify inpatient rehab admission. DC [facility] with [facility] when medically appropriate for DC.    Ranelle Oyster, MD, [facility] Physical Medicine & Rehabilitation     07/08/2013

## 2013-07-08 NOTE — Progress Notes (Signed)
Spoke with pt at his request concerning discharge. Pt states that he has a court date on 3/11 and do not want or need to miss it. MD is aware.

## 2013-07-08 NOTE — Progress Notes (Signed)
NEURO HOSPITALIST PROGRESS NOTE   SUBJECTIVE:                                                                                                                        Patient continues to feel stronger in his right arm.   OBJECTIVE:                                                                                                                           Vital signs in last 24 hours: Temp:  [98.3 F (36.8 C)-98.9 F (37.2 C)] 98.3 F (36.8 C) (03/09 0411) Pulse Rate:  [51-90] 51 (03/09 0411) Resp:  [18] 18 (03/09 0411) BP: (115-149)/(55-71) 115/55 mmHg (03/09 0411) SpO2:  [94 %-98 %] 96 % (03/09 0411)  Intake/Output from previous day: 03/08 0701 - 03/09 0700 In: 411 [P.O.:350; I.V.:3; IV Piggyback:58] Out: -  Intake/Output this shift: Total I/O In: -  Out: 400 [Urine:400] Nutritional status: General  History reviewed. No pertinent past medical history.   Neurologic Exam:  Mental Status: Alert, oriented, thought content appropriate.  Speech fluent without evidence of aphasia.  Able to follow 3 step commands without difficulty. Cranial Nerves: II:  Visual fields grossly normal, pupils equal, round, reactive to light and accommodation III,IV, VI: ptosis not present, extra-ocular motions intact bilaterally V,VII: smile symmetric, facial light touch sensation normal bilaterally VIII: hearing normal bilaterally IX,X: gag reflex present XI: bilateral shoulder shrug XII: midline tongue extension without atrophy or fasciculations  Motor: Right : Upper extremity   5/5  (4/5 tricep)   Left:     Upper extremity   5/5  Lower extremity   5/5      Lower extremity   5/5 Tone and bulk:normal tone throughout; no atrophy noted Sensory: Pinprick and light touch intact throughout, bilaterally Deep Tendon Reflexes:  Right: Upper Extremity   Left: Upper extremity   biceps (C-5 to C-6) 2/4   biceps (C-5 to C-6) 2/4 tricep (C7) 2/4    triceps (C7)  2/4 Brachioradialis (C6) 2/4  Brachioradialis (C6) 2/4  Lower Extremity Lower Extremity  quadriceps (L-2 to L-4) 2/4   quadriceps (L-2 to L-4) 2/4 Achilles (S1) 2/4   Achilles (S1) 2/4  Plantars: Right: downgoing  Left: downgoing    Lab Results: Basic Metabolic Panel:  Recent Labs Lab 07/03/13 1755 07/03/13 2253 07/06/13 0426  NA 142 139 138  K 3.6* 3.8 4.5  CL 103 102 103  CO2 21 23 25   GLUCOSE 97 98 144*  BUN 9 12 12   CREATININE 1.07 1.03 0.92  CALCIUM 9.7 9.1 9.6    Liver Function Tests:  Recent Labs Lab 07/03/13 2253  AST 16  ALT 17  ALKPHOS 45  BILITOT 1.2  PROT 7.4  ALBUMIN 4.0   No results found for this basename: LIPASE, AMYLASE,  in the last 168 hours No results found for this basename: AMMONIA,  in the last 168 hours  CBC:  Recent Labs Lab 07/03/13 1755 07/03/13 2253 07/06/13 0426  WBC 8.1 8.0 14.7*  NEUTROABS 5.6 4.2  --   HGB 14.0 13.1 13.5  HCT 39.2 37.5* 38.6*  MCV 93.6 93.5 94.6  PLT 244 240 253    Cardiac Enzymes: No results found for this basename: CKTOTAL, CKMB, CKMBINDEX, TROPONINI,  in the last 168 hours  Lipid Panel: No results found for this basename: CHOL, TRIG, HDL, CHOLHDL, VLDL, LDLCALC,  in the last 168 hours  CBG:  Recent Labs Lab 07/07/13 0713 07/07/13 1143 07/07/13 1634 07/07/13 2151 07/08/13 0749  GLUCAP 130* 134* 114* 117* 118*    Microbiology: No results found for this or any previous visit.  Coagulation Studies: No results found for this basename: LABPROT, INR,  in the last 72 hours  Imaging: No results found.     MEDICATIONS                                                                                                                        Scheduled: . aspirin  300 mg Rectal Daily   Or  . aspirin  325 mg Oral Daily  . methylPREDNISolone (SOLU-MEDROL) injection  1,000 mg Intravenous Q24H  . nicotine  21 mg Transdermal Daily  . pantoprazole  40 mg Oral Q24H  . polyethylene glycol  17  g Oral Daily  . senna  1 tablet Oral BID  . sodium chloride  3 mL Intravenous Q12H    ASSESSMENT/PLAN:                                                                                                            MS exacerbation. Will receive 5th and final dose of solumedrol today.   1)Continue PT, awaiting to see if CIR candidate.  2) will need a prednisone taper after last  dose of solumedrol. Prednisone starting at 60 mg decreasing by 10 mg Q day      Assessment and plan discussed with with attending physician and they are in agreement.    Felicie Morn PA-C [facility] [phone]  07/08/2013, 9:37 AM

## 2013-07-08 NOTE — Progress Notes (Signed)
Patient ID: Bobby BussingJason Stafford, male   DOB: [birth year], 38 y.o.   MRN: [id number]  [facility] PROGRESS NOTE  Bobby Stafford [id number] DOB: [birth year] DOA: 07/03/2013 PCP: No PCP Per Patient  Brief narrative:  38 yo male [facility] with 4 days of right arm and leg numbness and weakness. Denies any n/t or facial disturbance. No slurred speech. No fevers. No rashes. No vision changes. Overall healthy, just got out of jail. No report of recent head injury. No recent illnesses.   Principal Problem:  Numbness on right side  - secondary to MS as confirmed by imagining studies  - management per neurology  - continue steroids and supportive care with analgesia as needed  - pt responding well and feeling better this AM, ambulating well   - ambulate today, PT eval and CIR consult   Active Problems:  Tobacco use  - provide nicotine patch  Hyperglycemia  - from steroids  - monitor per protocol   Consultants:  Neurology  Procedures/Studies:  Ct Head Wo Contrast 07/04/2013 No evidence of large vessel infarct or hemorrhage. 2. Cerebral volume loss and white matter disease. The white matter changes could be from small vessel ischemia or demyelinating disease.  [name] W Wo Contrast 07/04/2013 Numerous supra- and infratentorial white matter lesions and cervical spinal cord lesions, consistent with demyelinating disease (multiple sclerosis). Numerous lesions demonstrate enhancement, consistent with active demyelination. Mild midcervical degenerative disc disease as above.  Antibiotics:  None  Code Status: Full  Family Communication: Pt at bedside  Disposition Plan: Home when done with IV steroids   HPI/Subjective: No events overnight.   Objective: Filed Vitals:   07/07/13 0602 07/07/13 1318 07/07/13 2042 07/08/13 0411  BP: 138/70 149/71 136/69 115/55  Pulse: 56 90 70 51  Temp: 97.7 F (36.5 C) 98.3 F (36.8 C) 98.9 F (37.2 C) 98.3 F (36.8 C)  TempSrc: Oral Oral Oral Oral   Resp: 16 18 18 18   Height:      Weight:      SpO2: 99% 94% 98% 96%    Intake/Output Summary (Last 24 hours) at 07/08/13 1047 Last data filed at 07/08/13 0835  Gross per 24 hour  Intake    411 ml  Output    400 ml  Net     11 ml    Exam:   General:  Pt is alert, follows commands appropriately, not in acute distress  Cardiovascular: Regular rate and rhythm, S1/S2, no murmurs, no rubs, no gallops  Respiratory: Clear to auscultation bilaterally, no wheezing, no crackles, no rhonchi  Abdomen: Soft, non tender, non distended, bowel sounds present, no guarding  Extremities: No edema, pulses DP and PT palpable bilaterally   Data Reviewed: Basic Metabolic Panel:  Recent Labs Lab 07/03/13 1755 07/03/13 2253 07/06/13 0426  NA 142 139 138  K 3.6* 3.8 4.5  CL 103 102 103  CO2 21 23 25   GLUCOSE 97 98 144*  BUN 9 12 12   CREATININE 1.07 1.03 0.92  CALCIUM 9.7 9.1 9.6   Liver Function Tests:  Recent Labs Lab 07/03/13 2253  AST 16  ALT 17  ALKPHOS 45  BILITOT 1.2  PROT 7.4  ALBUMIN 4.0   No results found for this basename: LIPASE, AMYLASE,  in the last 168 hours No results found for this basename: AMMONIA,  in the last 168 hours CBC:  Recent Labs Lab 07/03/13 1755 07/03/13 2253 07/06/13 0426  WBC 8.1 8.0 14.7*  NEUTROABS 5.6 4.2  --  HGB 14.0 13.1 13.5  HCT 39.2 37.5* 38.6*  MCV 93.6 93.5 94.6  PLT 244 240 253   Cardiac Enzymes: No results found for this basename: CKTOTAL, CKMB, CKMBINDEX, TROPONINI,  in the last 168 hours BNP: No components found with this basename: POCBNP,  CBG:  Recent Labs Lab 07/07/13 0713 07/07/13 1143 07/07/13 1634 07/07/13 2151 07/08/13 0749  GLUCAP 130* 134* 114* 117* 118*    No results found for this or any previous visit (from the past 240 hour(s)).   Scheduled Meds: . aspirin  300 mg Rectal Daily   Or  . aspirin  325 mg Oral Daily  . methylPREDNISolone (SOLU-MEDROL) injection  1,000 mg Intravenous Q24H  .  nicotine  21 mg Transdermal Daily  . pantoprazole  40 mg Oral Q24H  . polyethylene glycol  17 g Oral Daily  . senna  1 tablet Oral BID  . sodium chloride  3 mL Intravenous Q12H   Continuous Infusions:   Debbora Presto, MD  [facility] Pager [phone]  If 7PM-7AM, please contact night-coverage [email or web address] Password [email or web address] 07/08/2013, 10:47 AM   LOS: 5 days

## 2013-07-08 NOTE — Progress Notes (Signed)
Physical Therapy Treatment Patient Details Name: Bobby Stafford MRN: [id number] DOB: 11-23-1975 Today's Date: 07/08/2013 Time: 7672-0947 PT Time Calculation (min): 16 min  PT Assessment / Plan / Recommendation  History of Present Illness pt was admitted for numbness in RUE and RLE.  MRI consistent with MS   PT Comments   Pt progressing well with mobility and performed a few standing LE exercises with bil UE support.  Pt may be too high level for CIR at this time however pt reports he would like to d/c home.  Discussed using RW for safety upon d/c.   Follow Up Recommendations  Home health PT;CIR     Does the patient have the potential to tolerate intense rehabilitation     Barriers to Discharge        Equipment Recommendations  Rolling walker with 5" wheels    Recommendations for Other Services    Frequency Min 4X/week   Progress towards PT Goals Progress towards PT goals: Progressing toward goals  Plan Current plan remains appropriate    Precautions / Restrictions Precautions Precautions: Fall Restrictions Weight Bearing Restrictions: No   Pertinent Vitals/Pain RN in to bring meds end of session    Mobility  Bed Mobility Overal bed mobility: Modified Independent General bed mobility comments: sitting EOB on arrival Transfers Overall transfer level: Needs assistance Equipment used: Rolling walker (2 wheeled) Transfers: Sit to/from Stand Sit to Stand: Supervision General transfer comment: cues for  safety, uses wide BOS to stabilize Ambulation/Gait Ambulation/Gait assistance: Min guard Ambulation Distance (Feet): 400 Feet Assistive device: Rolling walker (2 wheeled) Gait Pattern/deviations: Step-through pattern;Wide base of support;Decreased dorsiflexion - right Gait velocity: decr General Gait Details: appears to have improved gait pattern since previous visit however still lacking control of R LE during swing phase, a couple instances of R side of RW lifting due to  poor R grip and WBing however no LOB observed, also ambulated another 120 feet with railing on L however decreased speed and coordination compared to using RW so pt agreeble to continue use of RW for now.    Exercises General Exercises - Lower Extremity Hip ABduction/ADduction: AROM;Both;10 reps Hip Flexion/Marching: AROM;Both;Standing;10 reps Heel Raises: AROM;Both;10 reps   PT Diagnosis:    PT Problem List:   PT Treatment Interventions:     PT Goals (current goals can now be found in the care plan section)    Visit Information  Last PT Received On: 07/08/13 Assistance Needed: +1 History of Present Illness: pt was admitted for numbness in RUE and RLE.  MRI consistent with MS    Subjective Data      Cognition  Cognition Arousal/Alertness: Awake/alert Behavior During Therapy: WFL for tasks assessed/performed Overall Cognitive Status: Within Functional Limits for tasks assessed    Balance     End of Session PT - End of Session Equipment Utilized During Treatment: Gait belt Activity Tolerance: Patient tolerated treatment well Patient left: in chair;with call bell/phone within reach;with nursing/sitter in room   GP     [name],[name] 07/08/2013, 4:39 PM Zenovia Jarred, PT, DPT 07/08/2013 Pager: [phone]

## 2013-07-09 DIAGNOSIS — G35 Multiple sclerosis: Secondary | ICD-10-CM | POA: Diagnosis present

## 2013-07-09 LAB — GLUCOSE, CAPILLARY
GLUCOSE-CAPILLARY: 134 mg/dL — AB (ref 70–99)
Glucose-Capillary: 150 mg/dL — ABNORMAL HIGH (ref 70–99)

## 2013-07-09 MED ORDER — PREDNISONE 10 MG PO TABS: ORAL_TABLET | ORAL | Status: DC

## 2013-07-09 MED ORDER — OXYCODONE-ACETAMINOPHEN 5-325 MG PO TABS: 1.0000 | ORAL_TABLET | ORAL | Status: DC | PRN

## 2013-07-09 MED ORDER — LORAZEPAM 1 MG PO TABS: 1.0000 mg | ORAL_TABLET | Freq: Three times a day (TID) | ORAL | Status: DC | PRN

## 2013-07-09 NOTE — Progress Notes (Signed)
Inpt. Rehab    Pt. Is doing too well to justify inpatient rehab admission.  At [facility] , [facility]  Will sign off  Please call if questions.    Weldon PickingSusan Novice Vrba PT Inpatient Rehab Admissions Coordinator Cell [phone] Office [phone]

## 2013-07-09 NOTE — Progress Notes (Signed)
Patient discharged home, discharge instructions given and explained to patient and he verbalized understanding, denies any distress, Accompanied home by friend, Pennie Banteraxi pass given for transportation. No wound noted, skin intact.

## 2013-07-09 NOTE — Progress Notes (Signed)
Pt requested to go to the ER to meet a visitor. Pt advised that he is considered a fall risk and cannot leave the floor without someone accompanying him. He declined. Pt advised to bring his guest to his room but he states that he wants to smoke a cigarette. Pt educated that based on his strength, recent medication administration, and other circumstances such as poor relationship with his visitor (ex-girlfriend) at this time is is not recommended that he leave the unit. Pt noted ambulating off the unit with walker 5 minutes later, getting on the elevator. Several staff members noted the member leaving the unit. Pt previously discussed current situation with RN about his girlfriend and his upcoming court date due to charges that she has filed against him.

## 2013-07-09 NOTE — Discharge Instructions (Signed)
Multiple Sclerosis  Multiple sclerosis (MS) is a disease of the central nervous system. It leads to loss of the insulating covering of the nerves (myelin sheath) of your brain. When this happens, brain signals do not get transmitted properly or may not get transmitted at all. The symptoms of MS occur in episodes or attacks. These attacks may last weeks to months. There may be long periods of nearly no problems between attacks. The age of onset of MS varies.   CAUSES  The cause of MS is unknown. However, it is more common in the northern [place] than in the southern [place].  RISK FACTORS  There is a higher incidence of MS in women than in men. MS is not an inherited illness, although your risk of MS is higher if you have a relative with MS.  SIGNS AND SYMPTOMS   The symptoms of MS occur in episodes or attacks. These attacks may last weeks to months. There may be long periods of almost no symptoms between attacks.  The symptoms of MS vary. This is because of the many different ways it affects the central nervous system. The main symptoms of MS include:   Vision problems and eye pain.   Numbness.   Weakness.   Paralysis in your arms, hands, feet, and legs (extremities).   Balance problems.   Tremors.  DIAGNOSIS   Your health care provider can diagnose MS with the help of imaging exams and lab tests. These may include specialized X-ray exams and spinal fluid tests. The best imaging exam to confirm a diagnosis of MS is MRI.  TREATMENT   There is no known cure for MS, but there are medicines that can decrease the number and frequency of attacks. Steroids are often used for short-term relief. Physical and occupational therapy may also help.  HOME CARE INSTRUCTIONS    Take medicines as directed by your health care provider.   Exercise as directed by your health care provider.  SEEK MEDICAL CARE IF:  You begin to feel depressed.  SEEK IMMEDIATE MEDICAL CARE IF:   You develop paralysis.   You develop  problems with bladder, bowel, or sexual function.   You develop mental changes, such as forgetfulness or mood swings.   You have a seizure.  Document Released: 04/15/2000 Document Revised: 02/06/2013 Document Reviewed: 12/24/2012  ExitCare Patient Information 2014 [employer].

## 2013-07-09 NOTE — Progress Notes (Signed)
NEURO HOSPITALIST PROGRESS NOTE   SUBJECTIVE:                                                                                                                        No new complaints.  Continues to feels stronger.   OBJECTIVE:                                                                                                                           Vital signs in last 24 hours: Temp:  [98.3 F (36.8 C)-98.5 F (36.9 C)] 98.3 F (36.8 C) (03/10 0511) Pulse Rate:  [65-96] 75 (03/10 0511) Resp:  [18-20] 18 (03/10 0511) BP: (116-146)/(43-80) 116/43 mmHg (03/10 0511) SpO2:  [95 %-100 %] 100 % (03/10 0511)  Intake/Output from previous day: 03/09 0701 - 03/10 0700 In: 938 [P.O.:880; IV Piggyback:58] Out: 400 [Urine:400] Intake/Output this shift:   Nutritional status: General  History reviewed. No pertinent past medical history.   Neurologic Exam:  Mental Status:  Alert, oriented, thought content appropriate. Speech fluent without evidence of aphasia. Able to follow 3 step commands without difficulty.  Cranial Nerves:  II: Visual fields grossly normal, pupils equal, round, reactive to light and accommodation  III,IV, VI: ptosis not present, extra-ocular motions intact bilaterally  V,VII: smile symmetric, facial light touch sensation normal bilaterally  VIII: hearing normal bilaterally  IX,X: gag reflex present  XI: bilateral shoulder shrug  XII: midline tongue extension without atrophy or fasciculations  Motor:  5/5 throughout Sensory: Pinprick and light touch intact throughout, bilaterally  Deep Tendon Reflexes:  2+ throughout Plantars:  Down going bilaterally      Lab Results: Basic Metabolic Panel:  Recent Labs Lab 07/03/13 1755 07/03/13 2253 07/06/13 0426  NA 142 139 138  K 3.6* 3.8 4.5  CL 103 102 103  CO2 21 23 25   GLUCOSE 97 98 144*  BUN 9 12 12   CREATININE 1.07 1.03 0.92  CALCIUM 9.7 9.1 9.6    Liver Function  Tests:  Recent Labs Lab 07/03/13 2253  AST 16  ALT 17  ALKPHOS 45  BILITOT 1.2  PROT 7.4  ALBUMIN 4.0   No results found for this basename: LIPASE, AMYLASE,  in the last 168 hours No  results found for this basename: AMMONIA,  in the last 168 hours  CBC:  Recent Labs Lab 07/03/13 1755 07/03/13 2253 07/06/13 0426  WBC 8.1 8.0 14.7*  NEUTROABS 5.6 4.2  --   HGB 14.0 13.1 13.5  HCT 39.2 37.5* 38.6*  MCV 93.6 93.5 94.6  PLT 244 240 253    Cardiac Enzymes: No results found for this basename: CKTOTAL, CKMB, CKMBINDEX, TROPONINI,  in the last 168 hours  Lipid Panel: No results found for this basename: CHOL, TRIG, HDL, CHOLHDL, VLDL, LDLCALC,  in the last 168 hours  CBG:  Recent Labs Lab 07/08/13 0749 07/08/13 1142 07/08/13 1649 07/08/13 2214 07/09/13 0752  GLUCAP 118* 123* 147* 153* 134*    Microbiology: No results found for this or any previous visit.  Coagulation Studies: No results found for this basename: LABPROT, INR,  in the last 72 hours  Imaging: No results found.     MEDICATIONS                                                                                                                        Scheduled: . aspirin  300 mg Rectal Daily   Or  . aspirin  325 mg Oral Daily  . nicotine  21 mg Transdermal Daily  . pantoprazole  40 mg Oral Q24H  . polyethylene glycol  17 g Oral Daily  . senna  1 tablet Oral BID  . sodium chloride  3 mL Intravenous Q12H    ASSESSMENT/PLAN:                                                                                                             MS exacerbation. Finished with Solumedrol  1)Continue PT, not a CIR candidate  2) will need a prednisone taper after last dose of solumedrol. Prednisone starting at 60 mg decreasing by 10 mg Q day  3) Follow up out patient with neurology ----[facility] [address], [place], [place] [address]--[phone] or [facility] [address], [place], [place] [address] [phone]  [phone]  Assessment and plan discussed with with attending physician and they are in agreement.    Felicie MornDavid Jaydien Panepinto PA-C [facility] [phone]  07/09/2013, 9:27 AM

## 2013-07-09 NOTE — Progress Notes (Signed)
Occupational Therapy Treatment Patient Details Name: Bobby Stafford MRN: [id number] DOB: 1976/03/24 Today's Date: 07/09/2013 Time: 7412-8786 OT Time Calculation (min): 12 min  OT Assessment / Plan / Recommendation  History of present illness pt was admitted for numbness in RUE and RLE.  MRI consistent with MS   OT comments  Short session today as pt's breakfast arrived.  Traded out Wellsite geologist for longer model  Follow Up Recommendations  Home health OT;Supervision/Assistance - 24 hour    Barriers to Discharge       Equipment Recommendations   (HHOT to assess for tub dme)    Recommendations for Other Services    Frequency Min 2X/week   Progress towards OT Goals Progress towards OT goals: Progressing toward goals  Plan Discharge plan needs to be updated    Precautions / Restrictions Precautions Precautions: Fall Restrictions Weight Bearing Restrictions: No   Pertinent Vitals/Pain No pain reported    ADL  Transfers/Ambulation Related to ADLs: worked on retrieving items with reacher in standing:  min cues for safety.  Ataxia noted when using long reacher for items. Also practiced opening and closing cabinet doors with walker    OT Diagnosis:    OT Problem List:   OT Treatment Interventions:     OT Goals(current goals can now be found in the care plan section) Acute Rehab OT Goals OT Goal Formulation: With patient  Visit Information  Last OT Received On: 07/09/13 Assistance Needed: +1 History of Present Illness: pt was admitted for numbness in RUE and RLE.  MRI consistent with MS    Subjective Data      Prior Functioning       Cognition  Cognition Arousal/Alertness: Awake/alert Behavior During Therapy: WFL for tasks assessed/performed Overall Cognitive Status: Within Functional Limits for tasks assessed    Mobility  Transfers Sit to Stand: Supervision    Exercises  Other Exercises Other Exercises: reviewed HEP, especially opposition with  theraputty.  Pt has a tendency to flex IP for abnormal pinch.  Performed hand over hand to demonstrate intended result and cued to try to move from MCP instead of IP as abnormal pinch is achieved when this occurs   Balance    End of Session OT - End of Session Activity Tolerance: Patient tolerated treatment well Patient left: in chair;with call bell/phone within reach  GO     [facility] 07/09/2013, 10:39 AM Marica Otter, OTR/L [phone] 07/09/2013

## 2013-07-09 NOTE — Progress Notes (Signed)
   CARE MANAGEMENT NOTE 07/09/2013  Patient:  Bobby Stafford, Bobby Stafford   Account Number:  [id number]  Date Initiated:  07/05/2013  Documentation initiated by:  Ezekiel Ina  Subjective/Objective Assessment:   pt admitted with numbness on right side     Action/Plan:   from home   Anticipated DC Date:  07/09/2013   Anticipated DC Plan:  HOME/SELF CARE      DC Planning Services  CM consult      PAC Choice  DURABLE MEDICAL EQUIPMENT   Choice offered to / List presented to:  C-1 Patient   DME arranged  Levan Hurst      DME agency  [facility]     [facility] arranged  NA      Status of service:  Completed, signed off Medicare Important Message given?  NA - LOS <3 / Initial given by admissions (If response is "NO", the following Medicare IM given date fields will be blank) Date Medicare IM given:   Date Additional Medicare IM given:    Discharge Disposition:  HOME/SELF CARE  Per UR Regulation:  Reviewed for med. necessity/level of care/duration of stay  If discussed at Long Length of Stay Meetings, dates discussed:    Comments:  07/05/13 1726 Clinton Gallant, RN, CCM [phone] CM spoke with [facility] 4W RN staes pt d./c home and order for RW given by MD [facility]W to his home CM spoke with [facility] at [facility] to provide oral referral (face sheet, order and progress notes CM faxed to [phone] with fax confirmation received at 1725  07/05/13 MMCGIBBONEY, RN, BSN Dr.Myers will get appointment for pt on 07/16/13 with [facility].

## 2013-07-09 NOTE — Progress Notes (Signed)
Pt returns to unit with visitor.

## 2013-07-09 NOTE — Progress Notes (Signed)
Pt asked that I speak with his mother Ms Lamar Sprinklesinkard [phone]. A call to pt's mother Ms Lamar Sprinklesinkard, revealed that she wanted to know if there was some type of indigent funds.  [facility], [facility] can get [facility]. [facility]. Ms Pinkard understood, pt also understands.

## 2013-07-09 NOTE — Discharge Summary (Signed)
Physician Discharge Summary  Bobby Stafford HUD:[id number] DOB: 02/18/1976 DOA: 07/03/2013  PCP: No PCP Per Patient  Admit date: 07/03/2013 Discharge date: 07/09/2013  Recommendations for Outpatient Follow-up:  1. Pt will need to follow up with PCP in 2-3 weeks post discharge 2. Please obtain BMP to evaluate electrolytes and kidney function 3. Please also check CBC to evaluate Hg and Hct levels 4. Patient was discharged on prednisone taper pack to complete therapy for 5 more days post discharge 5. Patient also advised to followup with myself in [facility] in next week  Discharge Diagnoses: Multiple sclerosis  Principal Problem:   Numbness on right side Active Problems:   Nystagmus   Weakness of right side of body   MS (multiple sclerosis)  Discharge Condition: Stable  Diet recommendation: Heart healthy diet discussed in details   Brief narrative:  38 yo male [facility] ED with 4 days of right arm and leg numbness and weakness. Denies any n/t or facial disturbance. No slurred speech. No fevers. No rashes. No vision changes. Overall healthy, just got out of jail. No report of recent head injury. No recent illnesses.   Principal Problem:  Numbness on right side  - secondary to MS as confirmed by imagining studies  - management per neurology  - continue steroids and supportive care with analgesia as needed  - pt responding well and feeling better this AM, ambulating well   Active Problems:  Tobacco use  - provide nicotine patch  Hyperglycemia  - from steroids  - monitor per protocol   Consultants:  Neurology  Procedures/Studies:  Ct Head Wo Contrast 07/04/2013 No evidence of large vessel infarct or hemorrhage. 2. Cerebral volume loss and white matter disease. The white matter changes could be from small vessel ischemia or demyelinating disease.  Mr Laqueta Jean Wo Contrast 07/04/2013 Numerous supra- and infratentorial white matter lesions and cervical spinal cord  lesions, consistent with demyelinating disease (multiple sclerosis). Numerous lesions demonstrate enhancement, consistent with active demyelination. Mild midcervical degenerative disc disease as above.  Antibiotics:  None  Code Status: Full  Family Communication: Pt at bedside   Discharge Exam: Filed Vitals:   07/09/13 0511  BP: 116/43  Pulse: 75  Temp: 98.3 F (36.8 C)  Resp: 18   Filed Vitals:   07/08/13 0411 07/08/13 1441 07/08/13 2300 07/09/13 0511  BP: 115/55 137/69 146/80 116/43  Pulse: 51 65 96 75  Temp: 98.3 F (36.8 C) 98.5 F (36.9 C) 98.3 F (36.8 C) 98.3 F (36.8 C)  TempSrc: Oral Oral Oral Oral  Resp: 18 20 20 18   Height:      Weight:      SpO2: 96% 95% 96% 100%    General: Pt is alert, follows commands appropriately, not in acute distress Cardiovascular: Regular rate and rhythm, S1/S2 +, no murmurs, no rubs, no gallops Respiratory: Clear to auscultation bilaterally, no wheezing, no crackles, no rhonchi Abdominal: Soft, non tender, non distended, bowel sounds +, no guarding Extremities: no edema, no cyanosis, pulses palpable bilaterally DP and PT Neuro: Grossly nonfocal  Discharge Instructions  Discharge Orders   Future Orders Complete By Expires   Diet - low sodium heart healthy  As directed    Increase activity slowly  As directed        Medication List         LORazepam 1 MG tablet  Commonly known as:  ATIVAN  Take 1 tablet (1 mg total) by mouth every 8 (eight) hours as  needed (spasms).     oxyCODONE-acetaminophen 5-325 MG per tablet  Commonly known as:  PERCOCET/ROXICET  Take 1-2 tablets by mouth every 3 (three) hours as needed for moderate pain.     predniSONE 10 MG tablet  Commonly known as:  DELTASONE  Take 60 mg tablet and taper down by 10 mg daily until completed           Follow-up Information   Follow up with Debbora PrestoMAGICK-Derric Dealmeida, MD On 07/16/2013.   Specialty:  Internal Medicine   Contact information:   [address] [place] [address] [phone]        The results of significant diagnostics from this hospitalization (including imaging, microbiology, ancillary and laboratory) are listed below for reference.     Microbiology: No results found for this or any previous visit (from the past 240 hour(s)).   Labs: Basic Metabolic Panel:  Recent Labs Lab 07/03/13 1755 07/03/13 2253 07/06/13 0426  NA 142 139 138  K 3.6* 3.8 4.5  CL 103 102 103  CO2 21 23 25   GLUCOSE 97 98 144*  BUN 9 12 12   CREATININE 1.07 1.03 0.92  CALCIUM 9.7 9.1 9.6   Liver Function Tests:  Recent Labs Lab 07/03/13 2253  AST 16  ALT 17  ALKPHOS 45  BILITOT 1.2  PROT 7.4  ALBUMIN 4.0   CBC:  Recent Labs Lab 07/03/13 1755 07/03/13 2253 07/06/13 0426  WBC 8.1 8.0 14.7*  NEUTROABS 5.6 4.2  --   HGB 14.0 13.1 13.5  HCT 39.2 37.5* 38.6*  MCV 93.6 93.5 94.6  PLT 244 240 253   CBG:  Recent Labs Lab 07/08/13 1142 07/08/13 1649 07/08/13 2214 07/09/13 0752 07/09/13 1151  GLUCAP 123* 147* 153* 134* 150*     SIGNED: Time coordinating discharge: Over 30 minutes  Debbora PrestoMAGICK-Anup Brigham, MD  [facility] 07/09/2013, 2:25 PM Pager [phone]  If 7PM-7AM, please contact night-coverage [email or web address] Password [email or web address]

## 2013-07-15 ENCOUNTER — Ambulatory Visit: Payer: Self-pay | Admitting: Internal Medicine

## 2013-07-16 ENCOUNTER — Encounter: Payer: Self-pay | Admitting: Internal Medicine

## 2013-07-16 ENCOUNTER — Ambulatory Visit: Payer: Medicaid Other | Attending: Internal Medicine | Admitting: Internal Medicine

## 2013-07-16 VITALS — BP 140/79 | HR 100 | Temp 98.1°F | Ht 76.0 in | Wt 316.0 lb

## 2013-07-16 DIAGNOSIS — Z79899 Other long term (current) drug therapy: Secondary | ICD-10-CM | POA: Insufficient documentation

## 2013-07-16 DIAGNOSIS — IMO0002 Reserved for concepts with insufficient information to code with codable children: Secondary | ICD-10-CM | POA: Insufficient documentation

## 2013-07-16 DIAGNOSIS — G35 Multiple sclerosis: Secondary | ICD-10-CM | POA: Diagnosis present

## 2013-07-16 DIAGNOSIS — F17200 Nicotine dependence, unspecified, uncomplicated: Secondary | ICD-10-CM | POA: Diagnosis not present

## 2013-07-16 MED ORDER — OXYCODONE-ACETAMINOPHEN 5-325 MG PO TABS: 1.0000 | ORAL_TABLET | ORAL | Status: DC | PRN

## 2013-07-22 ENCOUNTER — Telehealth: Payer: Self-pay | Admitting: Internal Medicine

## 2013-07-22 NOTE — Telephone Encounter (Signed)
Spoke with Bobby Stafford regarding pain medication Oxycodone refill. Bobby Stafford stated Dr. Izola Price told him she will be his PCP, and will given him refills on pain meds when needed. Bobby Stafford has  Called requesting refills. I informed him of our narcotic policy.I will also send message to Dr. Izola Price

## 2013-07-22 NOTE — Telephone Encounter (Signed)
Pt has been in a lot of pain and running out of meds.  Pt's fiancee calling to ask if pt can be put on different med or higher dosage.

## 2013-07-26 ENCOUNTER — Telehealth: Payer: Self-pay | Admitting: Internal Medicine

## 2013-07-26 NOTE — Telephone Encounter (Signed)
Pt has come in today requesting a referral to neurology; please f/u with pt

## 2013-07-26 NOTE — Telephone Encounter (Addendum)
Pt says he was here last week and was told that he would be referred to Neurologist but has not received any info about this and it does not appear under referrals in chart, alt pt number is [phone].

## 2013-07-27 NOTE — Progress Notes (Signed)
Patient ID: Bobby BussingJason Canizales, male   DOB: [birth year], 38 y.o.   MRN: [id number]   CC: follow up  HPI: Pt is 38 yo male with recent diagnosis of MS comes in for follow up. Denies chest pain or shortness of breath, needs referral to neurologist.   No Known Allergies No past medical history on file. Current Outpatient Prescriptions on File Prior to Visit  Medication Sig Dispense Refill  . predniSONE (DELTASONE) 10 MG tablet Take 60 mg tablet and taper down by 10 mg daily until completed  21 tablet  0  . LORazepam (ATIVAN) 1 MG tablet Take 1 tablet (1 mg total) by mouth every 8 (eight) hours as needed (spasms).  90 tablet  0   No current facility-administered medications on file prior to visit.   Family History  Problem Relation Age of Onset  . Diabetes type I Father    History   Social History  . Marital Status: Single    Spouse Name: N/A    Number of Children: N/A  . Years of Education: N/A   Occupational History  . Not on file.   Social History Main Topics  . Smoking status: Current Every Day Smoker -- 0.75 packs/day for 24 years    Types: Cigarettes  . Smokeless tobacco: Never Used  . Alcohol Use: 12.0 oz/week    20 Cans of beer per week  . Drug Use: 7.00 per week    Special: Marijuana  . Sexual Activity: Yes   Other Topics Concern  . Not on file   Social History Narrative  . No narrative on file    Review of Systems  Constitutional: Negative for fever, chills, diaphoresis, activity change, appetite change and fatigue.  HENT: Negative for ear pain, nosebleeds, congestion, facial swelling, rhinorrhea, neck pain, neck stiffness and ear discharge.   Eyes: Negative for pain, discharge, redness, itching and visual disturbance.  Respiratory: Negative for cough, choking, chest tightness, shortness of breath, wheezing and stridor.   Cardiovascular: Negative for chest pain, palpitations and leg swelling.  Gastrointestinal: Negative for abdominal distention.  Genitourinary:  Negative for dysuria, urgency, frequency, hematuria, flank pain, decreased urine volume, difficulty urinating and dyspareunia.  Musculoskeletal: Negative for back pain, joint swelling, arthralgias and gait problem.  Neurological: Negative for dizziness, tremors, seizures, syncope, facial asymmetry, speech difficulty, weakness, light-headedness, numbness and headaches.  Hematological: Negative for adenopathy. Does not bruise/bleed easily.  Psychiatric/Behavioral: Negative for hallucinations, behavioral problems, confusion, dysphoric mood, decreased concentration and agitation.    Objective:   Filed Vitals:   07/16/13 1214  BP: 140/79  Pulse: 100  Temp: 98.1 F (36.7 C)    Physical Exam  Constitutional: Appears well-developed and well-nourished. No distress.  HENT: Normocephalic. External right and left ear normal. Oropharynx is clear and moist.  Eyes: Conjunctivae and EOM are normal. PERRLA, no scleral icterus.  Neck: Normal ROM. Neck supple. No JVD. No tracheal deviation. No thyromegaly.  CVS: RRR, S1/S2 +, no murmurs, no gallops, no carotid bruit.  Pulmonary: Effort and breath sounds normal, no stridor, rhonchi, wheezes, rales.  Abdominal: Soft. BS +,  no distension, tenderness, rebound or guarding.  Musculoskeletal: Normal range of motion. No edema and no tenderness.  Lymphadenopathy: No lymphadenopathy noted, cervical, inguinal. Neuro: Alert. Normal reflexes, muscle tone coordination. No cranial nerve deficit. Skin: Skin is warm and dry. No rash noted. Not diaphoretic. No erythema. No pallor.  Psychiatric: Normal mood and affect. Behavior, judgment, thought content normal.   Lab Results  Component Value Date  WBC 14.7* 07/06/2013   HGB 13.5 07/06/2013   HCT 38.6* 07/06/2013   MCV 94.6 07/06/2013   PLT 253 07/06/2013   Lab Results  Component Value Date   CREATININE 0.92 07/06/2013   BUN 12 07/06/2013   NA 138 07/06/2013   K 4.5 07/06/2013   CL 103 07/06/2013   CO2 25 07/06/2013    No  results found for this basename: HGBA1C   Lipid Panel  No results found for this basename: chol, trig, hdl, cholhdl, vldl, ldlcalc   Assessment and plan:   Patient Active Problem List   Diagnosis Date Noted  . MS (multiple sclerosis) - stable, will provide referral to neurologist  07/09/2013

## 2013-07-31 NOTE — Telephone Encounter (Signed)
Referral place with appt

## 2013-08-16 ENCOUNTER — Telehealth: Payer: Self-pay | Admitting: Neurology

## 2013-08-16 ENCOUNTER — Ambulatory Visit (INDEPENDENT_AMBULATORY_CARE_PROVIDER_SITE_OTHER): Payer: PRIVATE HEALTH INSURANCE | Admitting: Neurology

## 2013-08-16 ENCOUNTER — Ambulatory Visit
Admission: RE | Admit: 2013-08-16 | Discharge: 2013-08-16 | Disposition: A | Discharge status: Home / Self Care | Payer: PRIVATE HEALTH INSURANCE | Source: Ambulatory Visit | Attending: Neurology | Admitting: Neurology

## 2013-08-16 ENCOUNTER — Encounter: Payer: Self-pay | Admitting: Neurology

## 2013-08-16 VITALS — BP 108/68 | HR 62 | Wt 319.1 lb

## 2013-08-16 DIAGNOSIS — G35 Multiple sclerosis: Secondary | ICD-10-CM

## 2013-08-16 DIAGNOSIS — M25561 Pain in right knee: Secondary | ICD-10-CM

## 2013-08-16 DIAGNOSIS — M25569 Pain in unspecified knee: Secondary | ICD-10-CM

## 2013-08-16 MED ORDER — BACLOFEN 5 MG HALF TABLET: 5.0000 mg | ORAL_TABLET | Freq: Three times a day (TID) | ORAL | Status: DC

## 2013-08-16 MED ORDER — AMBULATORY NON FORMULARY MEDICATION: 1.0000 | Freq: Every day | Status: DC

## 2013-08-16 MED ORDER — GABAPENTIN 300 MG PO CAPS: 300.0000 mg | ORAL_CAPSULE | Freq: Three times a day (TID) | ORAL | Status: DC

## 2013-08-16 NOTE — Telephone Encounter (Signed)
Pt's fiance called stating that his primary doctor stated that he will need a referral/order from Dr. Everlena CooperJaffe in order for the pt To get his medication. Please call Pt.

## 2013-08-16 NOTE — Patient Instructions (Signed)
I think you do have multiple sclerosis. 1.  We will start Tecfidera.  It is a pill taken twice daily.  Possible side effects include flushing, diarrhea, nausea and abdominal discomfort. 2.  For muscle spasms, we will start baclofen 5mg  three times daily as needed. 3.  For burning pain in the leg, we will start gabapentin 300mg  three times daily.  If you are too dizzy or sleepy, then cut back to 1 pill at bedtime. 4.  We will get X-ray of the right knee. 5.  We will try and get you a shower seat. 6.  Repeat MRI of the brain and cervical spine with and without contrast three months after starting the Tecfidera and then follow up with me right afterwards. 7.  Check B12, vitamin D, RF, ANCA, ANA, Sjogren's antibodies

## 2013-08-16 NOTE — Telephone Encounter (Signed)
Pt called wanting to know if Dr. Everlena Cooper will be able to Rx him Gabapentin and baclofen. Please call pt to confirm.

## 2013-08-16 NOTE — Telephone Encounter (Signed)
Pt. Would like to get a referral for pain clinic...Marland Kitchen.Marland Kitchen.the earliest appt. Available at our clinic is on July..pt. No longer has pain meds...Marland Kitchen.Marland Kitchen. Please call pt. If this is possible.

## 2013-08-16 NOTE — Progress Notes (Signed)
NEUROLOGY CONSULTATION NOTE  Bobby BussingJason Stafford MRN: [id number] DOB: [birth year]  Referring provider: Dr. Onalee Huaavid [facility]Hospital) Primary care provider: Dr. Izola PriceMyers  Reason for consult:  Newly-diagnosed MS  HISTORY OF PRESENT ILLNESS: Bobby BussingJason Seipp is a 38 year old left-handed man who presents with newly-diagnosed multiple sclerosis.  Records and images were personally reviewed.  He is accompanied by his wife.    He was admitted to [facility] on 07/03/13 with four days of weakness and numbness of the right arm and leg.  At one point, his knee buckled and he fell.  There was a concern for stroke.  CT of the head revealed cerebral volume loss and white matter changes.  MRI of the brain, cervical and thoracic spine with and without contrast revealed numerous supra and infratentiorial white matter lesions, also involving the cervical spine and with enhancement.  He was given Solumedrol 1000mg  daily for 3 days, followed by prednisone taper at discharge.  Since discharge, he has noticed improvement particularly with fine finger movements in his right hand.  He still has trouble walking, mostly due to the pain in his leg.  He describes a sharp aching pain in his anterior right thigh radiating to the knee, with knee pain and burning sensation from the knee down the leg and cramping in the calf.  He fell again and twisted the right knee.  He reports episode of vertigo in 2013.  It seemed like BPPV as it was positional and only lasted a minute or two.  It spontaneously resolved.  He also has a history of chronic back pain.  He works as a Corporate investment bankerconstruction worker.  He has some vision problems, but no episodes of transient vision loss suspicious of optic neuritis.  No other prior episodes of unilateral weakness or numbness similar to recent events.  There is no family history of MS>  PAST MEDICAL HISTORY: Past Medical History  Diagnosis Date  . MS (multiple sclerosis)     PAST SURGICAL HISTORY: Past Surgical History    Procedure Laterality Date  . No surgerys      MEDICATIONS: Current Outpatient Prescriptions on File Prior to Visit  Medication Sig Dispense Refill  . LORazepam (ATIVAN) 1 MG tablet Take 1 tablet (1 mg total) by mouth every 8 (eight) hours as needed (spasms).  90 tablet  0  . oxyCODONE-acetaminophen (PERCOCET/ROXICET) 5-325 MG per tablet Take 1-2 tablets by mouth every 3 (three) hours as needed for moderate pain.  90 tablet  0  . predniSONE (DELTASONE) 10 MG tablet Take 60 mg tablet and taper down by 10 mg daily until completed  21 tablet  0   No current facility-administered medications on file prior to visit.    ALLERGIES: No Known Allergies  FAMILY HISTORY: Family History  Problem Relation Age of Onset  . Diabetes type I Father     SOCIAL HISTORY: History   Social History  . Marital Status: Single    Spouse Name: N/A    Number of Children: N/A  . Years of Education: N/A   Occupational History  . Not on file.   Social History Main Topics  . Smoking status: Current Every Day Smoker -- 0.75 packs/day for 24 years    Types: Cigarettes  . Smokeless tobacco: Never Used  . Alcohol Use: 12.0 oz/week    20 Cans of beer per week  . Drug Use: 7.00 per week    Special: Marijuana  . Sexual Activity: Yes   Other Topics Concern  .  Not on file   Social History Narrative  . No narrative on file    REVIEW OF SYSTEMS: Constitutional: No fevers, chills, or sweats, no generalized fatigue, change in appetite Eyes: No visual changes, double vision, eye pain Ear, nose and throat: No hearing loss, ear pain, nasal congestion, sore throat Cardiovascular: No chest pain, palpitations Respiratory:  No shortness of breath at rest or with exertion, wheezes GastrointestinaI: No nausea, vomiting, diarrhea, abdominal pain, fecal incontinence Genitourinary:  Impotence.  No dysuria, urinary retention or frequency Musculoskeletal:  Neck pain, back pain, right leg pain Integumentary: No  rash, pruritus, skin lesions Neurological: as above Psychiatric: No depression, insomnia, anxiety Endocrine: No palpitations, fatigue, diaphoresis, mood swings, change in appetite, change in weight, increased thirst Hematologic/Lymphatic:  No anemia, purpura, petechiae. Allergic/Immunologic: no itchy/runny eyes, nasal congestion, recent allergic reactions, rashes  PHYSICAL EXAM: Filed Vitals:   08/16/13 0754  BP: 108/68  Pulse: 62   General: No acute distress Head:  Normocephalic/atraumatic Neck: supple, mild bilateral paraspinal tenderness, full range of motion.  Pain down spine when flexes neck. Back: Bilateral paraspinal tenderness Knee:  Tenderness to palpation of right knee. Heart: regular rate and rhythm Lungs: Clear to auscultation bilaterally. Vascular: No carotid bruits. Neurological Exam: Mental status: alert and oriented to person, place, and time, recent and remote memory intact, fund of knowledge intact, attention and concentration intact, speech fluent and not dysarthric, language intact. Cranial nerves: CN I: not tested CN II: pupils equal, round and reactive to light, visual fields intact, fundi unremarkable, without vessel changes, exudates, hemorrhages or papilledema. CN III, IV, VI:  full range of motion, no nystagmus, no ptosis CN V: facial sensation intact CN VII: upper and lower face symmetric CN VIII: hearing intact CN IX, X: gag intact, uvula midline CN XI: sternocleidomastoid and trapezius muscles intact CN XII: tongue midline Bulk & Tone: normal, no fasciculations. Motor: 5/5 throughout Sensation: endorsed reduced pinprick sensation in the right hand up to mid-forearm.  Endorsed reduced vibration sensation in right big toe. Deep Tendon Reflexes: 2+ throughout, toes down. Finger to nose testing: Bilateral intention tremor.  Past pointing with left hand. Heel to shin: No dysmetria but has mild difficulty lifting and moving right heel up and down left  shin. Gait: unusual gait consistent of short strides with antalgic quality.  No ataxia.  Says he is unable to walk on toes, heels or in tandem. Romberg negative.  IMPRESSION: Probable Multiple sclerosis, newly diagnosed.  PLAN: 1.  Since he had a clear clinical attack with MRI revealing old and active lesions in the brain and cervical spine, I don't think we need to proceed with LP. 2.  Discussed various immunomodulating agents with there benefits and side effects.  He decided on Tecfidera. 3.  XR of right knee given he fell on it and he has tenderness to palpation. 4.  Baclofen 5mg  up to three times daily for muscle spasms 5.  Gabapentin 300mg  three times daily for leg pain. 6.  Order shower seat 7.  Continue home exercises 8.  Repeat MRI of brain and cervical spine with and without contrast 3 months after initiating Tecidera, with follow up soon after that. 9.  Check serum B12, RF, ANA, ANCA, Sjogren's, Vitamin D.  Thank you for allowing me to take part in the care of this patient.  Shon Millet, DO  CC:  Danie Binder, MD

## 2013-08-16 NOTE — Telephone Encounter (Signed)
Spoke with patient he needs to get that through his PCP per Dr. Everlena Cooper

## 2013-08-16 NOTE — Progress Notes (Signed)
Note faxed to Dr Izola Price at [phone].

## 2013-08-16 NOTE — Telephone Encounter (Signed)
[facility] called, pt would like something for impotence called in as well. Please call [phone] / Sherri S.

## 2013-08-16 NOTE — Telephone Encounter (Signed)
Spoke with patient's wife and made her aware Dr Everlena Cooper is filling prescriptions. Sent to [facility] per their request.

## 2013-08-17 LAB — VITAMIN D 25 HYDROXY (VIT D DEFICIENCY, FRACTURES): VIT D 25 HYDROXY: 15 ng/mL — AB (ref 30–89)

## 2013-08-17 LAB — VITAMIN B12: Vitamin B-12: 438 pg/mL (ref 211–911)

## 2013-08-17 LAB — RHEUMATOID FACTOR

## 2013-08-19 LAB — SJOGRENS SYNDROME-B EXTRACTABLE NUCLEAR ANTIBODY: SSB (La) (ENA) Antibody, IgG: <1

## 2013-08-19 LAB — ANCA SCREEN W REFLEX TITER
Atypical p-ANCA Screen: NEGATIVE
c-ANCA Screen: NEGATIVE
p-ANCA Screen: NEGATIVE

## 2013-08-19 LAB — ANA: Anti Nuclear Antibody(ANA): NEGATIVE

## 2013-08-19 LAB — SJOGRENS SYNDROME-A EXTRACTABLE NUCLEAR ANTIBODY: SSA (Ro) (ENA) Antibody, IgG: <1

## 2013-08-20 ENCOUNTER — Encounter (HOSPITAL_COMMUNITY): Payer: Self-pay | Admitting: Emergency Medicine

## 2013-08-20 ENCOUNTER — Emergency Department (HOSPITAL_COMMUNITY)
Admission: EM | Admit: 2013-08-20 | Discharge: 2013-08-20 | Disposition: A | Discharge status: Home / Self Care | Payer: Medicaid Other | Attending: Emergency Medicine | Admitting: Emergency Medicine

## 2013-08-20 DIAGNOSIS — G8911 Acute pain due to trauma: Secondary | ICD-10-CM | POA: Diagnosis not present

## 2013-08-20 DIAGNOSIS — G8929 Other chronic pain: Secondary | ICD-10-CM | POA: Insufficient documentation

## 2013-08-20 DIAGNOSIS — Z8669 Personal history of other diseases of the nervous system and sense organs: Secondary | ICD-10-CM | POA: Insufficient documentation

## 2013-08-20 DIAGNOSIS — F17200 Nicotine dependence, unspecified, uncomplicated: Secondary | ICD-10-CM | POA: Insufficient documentation

## 2013-08-20 DIAGNOSIS — M25569 Pain in unspecified knee: Secondary | ICD-10-CM | POA: Insufficient documentation

## 2013-08-20 DIAGNOSIS — IMO0002 Reserved for concepts with insufficient information to code with codable children: Secondary | ICD-10-CM | POA: Diagnosis not present

## 2013-08-20 DIAGNOSIS — Z79899 Other long term (current) drug therapy: Secondary | ICD-10-CM | POA: Diagnosis not present

## 2013-08-20 DIAGNOSIS — M25561 Pain in right knee: Secondary | ICD-10-CM

## 2013-08-20 MED ORDER — OXYCODONE-ACETAMINOPHEN 5-325 MG PO TABS
1.0000 | ORAL_TABLET | ORAL | Status: DC | PRN
Start: 2013-08-20 — End: 2013-10-10

## 2013-08-20 MED ORDER — OXYCODONE-ACETAMINOPHEN 5-325 MG PO TABS
2.0000 | ORAL_TABLET | Freq: Once | ORAL | Status: AC
  Administered 2013-08-20: 2 via ORAL
  Filled 2013-08-20: qty 2

## 2013-08-20 NOTE — ED Notes (Signed)
Pt has MS, c/o right knee and back pain. States has numbness in the right lower extremity. Twisted right knee today

## 2013-08-20 NOTE — Discharge Instructions (Signed)
°Emergency Department Resource Guide °1) Find a Doctor and Pay Out of Pocket °Although you won't have to find out who is covered by your insurance plan, it is a good idea to ask around and get recommendations. You will then need to call the office and see if the doctor you have chosen will accept you as a new patient and what types of options they offer for patients who are self-pay. Some doctors offer discounts or will set up payment plans for their patients who do not have insurance, but you will need to ask so you aren't surprised when you get to your appointment. ° °2) Contact Your Local Health Department °Not all health departments have doctors that can see patients for sick visits, but many do, so it is worth a call to see if yours does. If you don't know where your local health department is, you can check in your phone book. The CDC also has a tool to help you locate your state's health department, and many state websites also have listings of all of their local health departments. ° °3) Find a Walk-in Clinic °If your illness is not likely to be very severe or complicated, you may want to try a walk in clinic. These are popping up all over the country in pharmacies, drugstores, and shopping centers. They're usually staffed by nurse practitioners or physician assistants that have been trained to treat common illnesses and complaints. They're usually fairly quick and inexpensive. However, if you have serious medical issues or chronic medical problems, these are probably not your best option. ° °No Primary Care Doctor: °- Call [facility] Connect at  832-8000 - they can help you locate a primary care doctor that  accepts your insurance, provides certain services, etc. °- Physician Referral Service- 1-800-533-3463 ° °Chronic Pain Problems: °Organization         Address  Phone   Notes  °[facility]  (336) 297-2271 Patients need to be referred by their primary care doctor.  ° °Medication  Assistance: °Organization         Address  Phone   Notes  °[place] [facility] [address] °[place], [place] [address] (336) 641-8030 --Must be a resident of [place] °-- Must have NO insurance coverage whatsoever (no Medicaid/ Medicare, etc.) °-- The pt. MUST have a primary care doctor that directs their care regularly and follows them in the community °  °[facility]  (866) 331-1348   °[employer]  (888) 892-1162   ° °Agencies that provide inexpensive medical care: °Organization         Address  Phone   Notes  °[facility]  (336) 832-8035   °[facility]    (336) 832-7272   °[facility] [address] °[place], [place] [address] (336) 832-4777   °[facility] [address], °[place] (336) 271-4999   °[facility]    (336) 373-0678   °[facility]    (336) 272-1050   °[facility] ° [address],  Phone:  (336) 832-4444, Fax:  (336) 832-4440 Hours of Operation:  9 am - 6 pm, M-F.  Also accepts Medicaid/Medicare and self-pay.  °[facility] ° [address],  Phone: (336) 832-3150, Fax: (336) [phone]. Hours of Operation:  8:30 am - 5:30 pm, M-F.  Also accepts Medicaid and self-pay.  °[facility] [place] [address], [place] Phone: (336) 878-6027   °[facility] [address], [place], [place] (336)723-1848, Ext. 123 Mondays & Thursdays: 7-9 AM.  First 15 patients are seen on a first come, first serve basis. °  ° °Medicaid-accepting [place] Providers: ° °Organization         Address  Phone   Notes  °[facility] [address], [place] (336) 641-2100 Also accepts self-pay patients.  °[facility] [address], [place] ° (336) 856-9996   °[facility] [address], [place]  (336) 288-8857   °[facility] [address] [place] [address], [place] (336) 299-7000   °Veita Bland [address], [place]  ° (336) 373-1557 Only accepts [place] Access Medicaid patients after they have their name applied to their card.  ° °Self-Pay (no insurance) in [place]: ° °Organization         Address  Phone   Notes  °Sickle Cell Patients, [facility] [address], [place] (336) 832-1970   °[facility] [address], [place] (336) 832-4400   °[facility] ° [address],  (336) 992-4800   °[facility]/Dr. Osei-Bonsu ° [address] [place] [address], [place] or [address], [place] (336) 841-8500 Phone number for both [place] and [place] locations is the same.  °[facility] [address], [place] (336) 299-0000   °[facility] [address] [place] [address], [place] or [address] (336) 852-7530 °(336) 878-2260   °[facility] [address], [place] (336) 350-1642, phone; (336) 294-5005, fax Sees patients 1st and 3rd Saturday of every month.  Must not qualify for public or private insurance (i.e. Medicaid, Medicare, [place] Health Choice, Veterans' Benefits) • Household income should be no more than 200% of the poverty level •The clinic cannot treat you if you are pregnant or think you are pregnant • Sexually transmitted diseases are not treated at the clinic.  ° ° °Dental Care: °Organization         Address  Phone  Notes  °[place] [facility] [address], [place] (336) 641-6152 Accepts children up to age 21 who are enrolled in Medicaid or [place] Health Choice; pregnant women with a Medicaid card; and children who have applied for Medicaid or [place] Health Choice, but were declined, whose parents can pay a reduced fee at time of service.  °[place]  [facility] [place]  [address], [place] (336) 641-7733 Accepts children up to age 21 who are enrolled in Medicaid or [place] Health Choice; pregnant women with a Medicaid card; and children who have applied for Medicaid or [place] Health Choice, but were declined, whose parents can pay a reduced fee at time of service.  °[facility] ° [address],  (336) 641-4533 Patients are seen by appointment only. Walk-ins are not accepted. [facility] will see patients 18 years of age and older. °Monday - Tuesday (8am-5pm) °Most Wednesdays (8:30-5pm) °$30 per visit, cash only  °[facility] ° [address], [place] (336) 641-4533 Patients are seen by appointment only. Walk-ins are not accepted. [facility] will see patients 18 years of age and older. °One   Wednesday Evening (Monthly: Volunteer Based).  $30 per visit, cash only  °[facility]  (919) 537-3737 for adults; Children under age 4, call Graduate Pediatric Dentistry at (919) [phone]. Children aged 4-14, please call (919) [phone] to request a pediatric application. ° Dental services are provided in all areas of dental care including fillings, crowns and bridges, complete and partial dentures, implants, gum treatment, root canals, and extractions. Preventive care is also provided. Treatment is provided to both adults and children. °Patients are selected via a lottery and there is often a waiting list. °  °[facility] [address], °[place] ° (336) 763-8833 [email or web address] °  °[facility] [address], [place], [place] (336)723-1848, Ext. 123 Second and Fourth Thursday of each month, opens at 6:30 AM; Clinic ends at 9 AM.  Patients are seen on a first-come first-served basis, and a limited number are seen during each clinic.  ° °[facility] ° [address], [place], [place] (336) 723-7904    Eligibility Requirements °You must have lived in [place], [place], or [place] counties for at least the last three months. °  You cannot be eligible for state or federal sponsored healthcare insurance, including Veterans Administration, Medicaid, or Medicare. °  You generally cannot be eligible for healthcare insurance through your employer.  °  How to apply: °Eligibility screenings are held every Tuesday and Wednesday afternoon from 1:00 pm until 4:00 pm. You do not need an appointment for the interview!  °[facility] [address], [place], [place] 336-631-2330   °[facility] [facility]  336-342-8273   °[facility]  336-703-3100   °[facility]  336-570-6415   ° °Behavioral Health Resources in the Community: °Intensive Outpatient Programs °Organization         Address  Phone  Notes  °[facility] [address], [place], [place] 336-878-6098   °[facility] [address], [place], [place] 336-832-9800   °[facility] [address], [place], [place] ° 336-882-2125   °[facility] [address],  °[place], [place] 1-800-853-5163 or 336-641-4981   °Substance Abuse Resources °Organization         Address  Phone  Notes  °[facility]  336-882-2125   °[facility]  336-784-9470   °[facility]  336-285-9073   °[facility]  336-845-3988   °Residential & Outpatient Substance Abuse Program  1-800-659-3381   °Psychological Services °Organization         Address  Phone  Notes  °[facility]  336- 832-9600   °[facility]  336- 378-7881   °[facility] [address], [place] 1-800-853-5163 or 336-641-4981   ° °Mobile Crisis Teams °Organization         Address  Phone  Notes  °[facility], Mobile Crisis Care Unit  1-877-626-1772   °Assertive °[facility] ° [address]  [place], [place] 336-834-9664   °Sharon DeEsch [address] °[place] 336-554-5454   ° °Self-Help/Support Groups °Organization         Address  Phone             Notes  °[facility] [place] - variety of support groups  336- 373-1402 Call for more information  °Narcotics Anonymous (NA), [facility] [address], °[place]  2 meetings at this location  ° °  Residential Treatment Programs °Organization         Address  Phone  Notes  °[facility] [address],    °[place]  1-866-801-8205   °[facility] ° [address], [place], [place] 704-293-8524   °[facility] [address], [place] 336-845-3988 Admissions: 8am-3pm M-F  °[facility] [address]    °[place], [place] 336-841-1104   °[facility] [address], [place], [place] 336-379-7146   °[facility] [address]  °[place], [place] 336-285-9073   °[facility] - Intensive Outpatient [address], [place], [place] 336-852-3033   °[facility] [address]  °[place], [place] 1-877-615-2722 or 336-784-9470   °[facility] [address] [place], [place] 336-227-7417 Accepts Medicaid  °[facility] [address]  °[place] 1-800-659-3381 Substance Abuse/Addiction Treatment  ° °[place] Behavioral Health Resources °Organization         Address  Phone  Notes  °[facility]  (888) 581-9988   °Julie Brannon, PhD [address] [place], [place]   (336) 349-5553 or (336) 951-0000   °[facility]   [address] °[place], [place] (336) 349-4454   °[facility] [address], [place], [place] (336) 342-8316 Insurance/Medicaid/sponsorship through [facility]  °[facility] [address] [place]                                    [place], [place] (336) 342-8316 Therapy/tele-psych/case    °[facility] [address]  ° [place], [place] (336) 349-2233    °Dr. Arfeen  (336) 349-4544   °[facility] [place]  [employer] [place] [facility] 1) [address], [place] °2) [address], [place] °3)  [address], [place] (336) 349-3220 °(336) 342-7768 ° °(336) 342-8140   °[place] Child Abuse [facility] (336) 342-1394 or (336) 342-3537 (After Hours)    ° ° °

## 2013-08-21 NOTE — ED Provider Notes (Signed)
CSN: [id number]     Arrival date & time 08/20/13  1557 History   First MD Initiated Contact with Patient 08/20/13 1610     Chief Complaint  Patient presents with  . Multiple Sclerosis  . Pain     HPI Patient presents to the emergency department because of ongoing right knee pain since a twisting injury several days ago.  He was seen in the neurology office yesterday for evaluation of his MS and an x-ray of his right knee was performed which demonstrates no acute osseous abnormalities.  Patient reports he has ongoing chronic pain from his MS as well as ongoing chronic pain in his right knee.  He reports that he was feeling better after he was prescribed Percocet for his last hospitalization but he has no primary care physician to prescribe him ongoing pain medication.  He does have a primary care physician to the [facility] but states they are reluctant to prescribe narcotic medications to any other patients.  He does not have an appointment with a pain physician.  His neurologist will not prescribe him pain medication.  He has no fevers or chills.  He has no other complaints.   Past Medical History  Diagnosis Date  . MS (multiple sclerosis)    Past Surgical History  Procedure Laterality Date  . No surgerys     Family History  Problem Relation Age of Onset  . Diabetes type I Father    History  Substance Use Topics  . Smoking status: Current Every Day Smoker -- 0.75 packs/day for 24 years    Types: Cigarettes  . Smokeless tobacco: Never Used  . Alcohol Use: 12.0 oz/week    20 Cans of beer per week    Review of Systems  All other systems reviewed and are negative.     Allergies  Review of patient's allergies indicates no known allergies.  Home Medications   Prior to Admission medications   Medication Sig Start Date End Date Taking? Authorizing Provider  AMBULATORY NON FORMULARY MEDICATION 1 Device by Does not apply route daily. Shower seat 08/16/13   Cira ServantAdam  Robert Jaffe, DO  baclofen (LIORESAL) 5 mg TABS tablet Take 0.5 tablets (5 mg total) by mouth 3 (three) times daily. 08/16/13   Adam Gus Rankinobert Jaffe, DO  gabapentin (NEURONTIN) 300 MG capsule Take 1 capsule (300 mg total) by mouth 3 (three) times daily. 08/16/13   Adam Gus Rankinobert Jaffe, DO  LORazepam (ATIVAN) 1 MG tablet Take 1 tablet (1 mg total) by mouth every 8 (eight) hours as needed (spasms). 07/09/13   Dorothea OgleIskra M Myers, MD  oxyCODONE-acetaminophen (PERCOCET/ROXICET) 5-325 MG per tablet Take 1-2 tablets by mouth every 3 (three) hours as needed for moderate pain. 07/16/13   Dorothea OgleIskra M Myers, MD  oxyCODONE-acetaminophen (PERCOCET/ROXICET) 5-325 MG per tablet Take 1 tablet by mouth every 4 (four) hours as needed for severe pain. 08/20/13   Lyanne CoKevin M Jamaree Hosier, MD  predniSONE (DELTASONE) 10 MG tablet Take 60 mg tablet and taper down by 10 mg daily until completed 07/09/13   Dorothea OgleIskra M Myers, MD   BP 130/72  Pulse 68  Temp(Src) 98 F (36.7 C)  Resp 18  SpO2 97% Physical Exam  Nursing note and vitals reviewed. Constitutional: He is oriented to person, place, and time. He appears well-developed and well-nourished.  HENT:  Head: Normocephalic and atraumatic.  Eyes: EOM are normal.  Neck: Normal range of motion.  Cardiovascular: Normal rate, regular rhythm, normal heart sounds and intact  distal pulses.   Pulmonary/Chest: Effort normal and breath sounds normal. No respiratory distress.  Abdominal: Soft. He exhibits no distension. There is no tenderness.  Musculoskeletal: Normal range of motion.  No joint effusion.  Full range of motion of right knee.  Neurological: He is alert and oriented to person, place, and time.  Skin: Skin is warm and dry.  Psychiatric: He has a normal mood and affect. Judgment normal.    ED Course  Procedures (including critical care time) Labs Review Labs Reviewed - No data to display  Imaging Review No results found.   EKG Interpretation None      MDM   Final diagnoses:   Right knee pain    Overall patient is well-appearing.  Right knee x-rays were reviewed and negative.  Outpatient orthopedic and PCP followup.  He's been referred to additional primary care physicians that may have the ability to prescribe him narcotic medications.  I do not think the patient is drug-seeking normally and drink for his pain medication.  I think he honestly has ongoing chronic pain and he history of undefined assistance without insurance.  I told him that I will prescribe him a short course of pain medication to the emergency department but that unfortunately the ER is also place for ongoing chronic pain needs.  He and his wife understand this.  They're very happy to receive additional information regarding low cost health choices in the community.     Lyanne Co, MD 08/21/13 [phone]

## 2013-08-24 ENCOUNTER — Emergency Department (INDEPENDENT_AMBULATORY_CARE_PROVIDER_SITE_OTHER)
Admission: EM | Admit: 2013-08-24 | Discharge: 2013-08-24 | Discharge status: Against Medical Advice | Payer: PRIVATE HEALTH INSURANCE | Source: Home / Self Care | Attending: Family Medicine | Admitting: Family Medicine

## 2013-08-24 ENCOUNTER — Encounter (HOSPITAL_COMMUNITY): Payer: Self-pay | Admitting: Emergency Medicine

## 2013-08-24 DIAGNOSIS — G8929 Other chronic pain: Secondary | ICD-10-CM

## 2013-08-24 NOTE — ED Notes (Signed)
C/o falling and MS States this morning he fell getting out the shower States his leg went out on him States right leg is swollen States right side of body is tingling Does have mid back pain Neuro appt was 08/16/13  PCP appt is 10/10/13 Was seen in ER on 08/20/13
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
## 2013-08-24 NOTE — ED Provider Notes (Signed)
CSN: [id number]     Arrival date & time 08/24/13  1102 History   First MD Initiated Contact with Patient 08/24/13 1226     Chief Complaint  Patient presents with  . Fall  . Multiple Sclerosis   (Consider location/radiation/quality/duration/timing/severity/associated sxs/prior Treatment) HPI Comments: Patient presented today with his wife. He was diagnosed with MS not to long ago and has been evaluated by Neuro earlier this month. He also has chronic back and recently ongoing right knee pain. He was in the ER 3 days and given Percocet which he admits to taking all of these in (#20) the past few days, taking 2 at a time. He reports that he has "all over pain" and he doesn't know what to do. We discussed this calmly that perhaps we could do a steroid injection in the right knee and a Toradol injection as well. He and his wife started crying and hugging one another. I explained that I could not write additional pain medications beyond 10 mg of Percocet; and that this was not the purpose of the Urgent care. Again I expressed that I knew they were frustrated with their current situation; however we could try other types of pain relievers again referencing  the injection. He became agitated and his spouse did as well. They both got up and wanted to leave. I tried to calm then again saying that I would help them with other methods. He was crying. As he got up to leave he threatened to get his gun and kill himself. I then expressed acknowledgement of this and said if that is the case I needed to call someone. He then turned to me and said he was going to bring his gun up here. I said excuse me, and they quickly walked ahead and out. I notified Dr. Artis Flock. Security was called at that point.   Patient is a 38 y.o. male presenting with fall. The history is provided by the patient and the spouse.  Fall    Past Medical History  Diagnosis Date  . MS (multiple sclerosis)    Past Surgical History  Procedure  Laterality Date  . No surgerys     Family History  Problem Relation Age of Onset  . Diabetes type I Father    History  Substance Use Topics  . Smoking status: Current Every Day Smoker -- 0.75 packs/day for 24 years    Types: Cigarettes  . Smokeless tobacco: Never Used  . Alcohol Use: 12.0 oz/week    20 Cans of beer per week    Review of Systems  Allergies  Review of patient's allergies indicates no known allergies.  Home Medications   Prior to Admission medications   Medication Sig Start Date End Date Taking? Authorizing Provider  AMBULATORY NON FORMULARY MEDICATION 1 Device by Does not apply route daily. Shower seat 08/16/13   Cira Servant, DO  baclofen (LIORESAL) 5 mg TABS tablet Take 0.5 tablets (5 mg total) by mouth 3 (three) times daily. 08/16/13   Adam Gus Rankin, DO  gabapentin (NEURONTIN) 300 MG capsule Take 1 capsule (300 mg total) by mouth 3 (three) times daily. 08/16/13   Adam Gus Rankin, DO  LORazepam (ATIVAN) 1 MG tablet Take 1 tablet (1 mg total) by mouth every 8 (eight) hours as needed (spasms). 07/09/13   Dorothea Ogle, MD  oxyCODONE-acetaminophen (PERCOCET/ROXICET) 5-325 MG per tablet Take 1-2 tablets by mouth every 3 (three) hours as needed for moderate pain. 07/16/13   Trevor Iha  Izola PriceMyers, MD  oxyCODONE-acetaminophen (PERCOCET/ROXICET) 5-325 MG per tablet Take 1 tablet by mouth every 4 (four) hours as needed for severe pain. 08/20/13   Lyanne CoKevin M Campos, MD  predniSONE (DELTASONE) 10 MG tablet Take 60 mg tablet and taper down by 10 mg daily until completed 07/09/13   Dorothea OgleIskra M Myers, MD   BP 140/79  Pulse 55  Resp 18  SpO2 99% Physical Exam  ED Course  Procedures (including critical care time) Labs Review Labs Reviewed - No data to display  Imaging Review No results found.   MDM   1. Chronic pain   See HPI, patient left AMA after verbal aggression shown    Riki SheerMichelle G Zahra Peffley, PA-C 09/02/13 1929

## 2013-08-26 ENCOUNTER — Telehealth: Payer: Self-pay | Admitting: Neurology

## 2013-08-26 ENCOUNTER — Telehealth: Payer: Self-pay | Admitting: *Deleted

## 2013-08-26 NOTE — Telephone Encounter (Signed)
Pt's fiance called following up on his xrays and lab results for pt to start his MS medication. Please call pt or his fiance.

## 2013-08-26 NOTE — Telephone Encounter (Signed)
Patient is aware that his Vit D is very low and was instructed to take D3 4000 IU daily  I also advised him if he is having mood swings he might need to discuss with his PCP

## 2013-08-26 NOTE — Telephone Encounter (Signed)
Left message to call office

## 2013-09-03 ENCOUNTER — Telehealth: Payer: Self-pay | Admitting: Emergency Medicine

## 2013-09-03 ENCOUNTER — Telehealth: Payer: Self-pay | Admitting: Internal Medicine

## 2013-09-03 ENCOUNTER — Telehealth: Payer: Self-pay | Admitting: *Deleted

## 2013-09-03 NOTE — Telephone Encounter (Signed)
Returning you call

## 2013-09-03 NOTE — Telephone Encounter (Signed)
Spoke to pt wife in regards to pain referral before scheduled OV. Informed wife, we cant  Place referral until pt is seen by provider. Pt has scheduled appt 09/09/13

## 2013-09-03 NOTE — Telephone Encounter (Signed)
Patient is aware that [employer] has been approved for him and [facility] will be calling to set up appt with him to start medication . I left office number to call back if he has any questions.

## 2013-09-03 NOTE — Telephone Encounter (Signed)
Pt calling says he is in a lot of pain and needs referral to pain clinic.  Has appt on 09/09/13 but would like to speak to nurse as to whether referral can be put in place before office visit, pt feels like he is having to go through loop holes regarding treatment.  Please f/u with pt.

## 2013-09-09 ENCOUNTER — Encounter: Payer: Self-pay | Admitting: Internal Medicine

## 2013-09-09 ENCOUNTER — Ambulatory Visit: Payer: PRIVATE HEALTH INSURANCE

## 2013-09-09 ENCOUNTER — Ambulatory Visit: Payer: PRIVATE HEALTH INSURANCE | Attending: Internal Medicine | Admitting: Internal Medicine

## 2013-09-09 VITALS — BP 116/67 | HR 70 | Temp 98.8°F | Resp 14 | Ht 76.5 in | Wt 319.0 lb

## 2013-09-09 DIAGNOSIS — Z0000 Encounter for general adult medical examination without abnormal findings: Secondary | ICD-10-CM

## 2013-09-09 DIAGNOSIS — F3289 Other specified depressive episodes: Secondary | ICD-10-CM

## 2013-09-09 DIAGNOSIS — M545 Low back pain, unspecified: Secondary | ICD-10-CM | POA: Insufficient documentation

## 2013-09-09 DIAGNOSIS — G35 Multiple sclerosis: Secondary | ICD-10-CM | POA: Insufficient documentation

## 2013-09-09 DIAGNOSIS — N529 Male erectile dysfunction, unspecified: Secondary | ICD-10-CM

## 2013-09-09 DIAGNOSIS — F329 Major depressive disorder, single episode, unspecified: Secondary | ICD-10-CM

## 2013-09-09 DIAGNOSIS — F32A Depression, unspecified: Secondary | ICD-10-CM

## 2013-09-09 MED ORDER — TADALAFIL 20 MG PO TABS: 10.0000 mg | ORAL_TABLET | ORAL | Status: DC | PRN

## 2013-09-09 MED ORDER — ACETAMINOPHEN-CODEINE #3 300-30 MG PO TABS: 1.0000 | ORAL_TABLET | Freq: Four times a day (QID) | ORAL | Status: DC | PRN

## 2013-09-09 MED ORDER — SILDENAFIL CITRATE 20 MG PO TABS: 20.0000 mg | ORAL_TABLET | Freq: Three times a day (TID) | ORAL | Status: DC

## 2013-09-09 NOTE — Patient Instructions (Signed)
Multiple Sclerosis Multiple sclerosis (MS) is a disease of the central nervous system. It leads to loss of the insulating covering of the nerves (myelin sheath) of your brain. When this happens, brain signals do not get transmitted properly or may not get transmitted at all. The symptoms of MS occur in episodes or attacks. These attacks may last weeks to months. There may be long periods of nearly no problems between attacks. The age of onset of MS varies.  CAUSES The cause of MS is unknown. However, it is more common in the [place] than in the [place]. RISK FACTORS There is a higher incidence of MS in women than in men. MS is not an inherited illness, although your risk of MS is higher if you have a relative with MS. SIGNS AND SYMPTOMS  The symptoms of MS occur in episodes or attacks. These attacks may last weeks to months. There may be long periods of almost no symptoms between attacks. The symptoms of MS vary. This is because of the many different ways it affects the central nervous system. The main symptoms of MS include:  Vision problems and eye pain.  Numbness.  Weakness.  Paralysis in your arms, hands, feet, and legs (extremities).  Balance problems.  Tremors. DIAGNOSIS  Your health care provider can diagnose MS with the help of imaging exams and lab tests. These may include specialized X-ray exams and spinal fluid tests. The best imaging exam to confirm a diagnosis of MS is MRI. TREATMENT  There is no known cure for MS, but there are medicines that can decrease the number and frequency of attacks. Steroids are often used for short-term relief. Physical and occupational therapy may also help. HOME CARE INSTRUCTIONS   Take medicines as directed by your health care provider.  Exercise as directed by your health care provider. SEEK MEDICAL CARE IF: You begin to feel depressed. SEEK IMMEDIATE MEDICAL CARE IF:  You develop paralysis.  You develop  problems with bladder, bowel, or sexual function.  You develop mental changes, such as forgetfulness or mood swings.  You have a seizure. Document Released: 04/15/2000 Document Revised: 02/06/2013 Document Reviewed: 12/24/2012 [facility] Patient Information 2014 [place], [place]. Erectile Dysfunction Erectile dysfunction is the inability to get or sustain a good enough erection to have sexual intercourse. Erectile dysfunction may involve:  Inability to get an erection.  Lack of enough hardness to allow penetration.  Loss of the erection before sex is finished.  Premature ejaculation. CAUSES  Certain drugs, such as:  Pain relievers.  Antihistamines.  Antidepressants.  Blood pressure medicines.  Water pills (diuretics).  Ulcer medicines.  Muscle relaxants.  Illegal drugs.  Excessive drinking.  Psychological causes, such as:  Anxiety.  Depression.  Sadness.  Exhaustion.  Performance fear.  Stress.  Physical causes, such as:  Artery problems. This may include diabetes, smoking, liver disease, or atherosclerosis.  High blood pressure.  Hormonal problems, such as low testosterone.  Obesity.  Nerve problems. This may include back or pelvic injuries, diabetes mellitus, multiple sclerosis, or Parkinson disease. SYMPTOMS  Inability to get an erection.  Lack of enough hardness to allow penetration.  Loss of the erection before sex is finished.  Premature ejaculation.  Normal erections at some times, but with frequent unsatisfactory episodes.  Orgasms that are not satisfactory in sensation or frequency.  Low sexual satisfaction in either partner because of erection problems.  A curved penis occurring with erection. The curve may cause pain or may be too  curved to allow for intercourse.  Never having nighttime erections. DIAGNOSIS Your caregiver can often diagnose this condition by:  Performing a physical exam to find other diseases or specific  problems with the penis.  Asking you detailed questions about the problem.  Performing blood tests to check for diabetes mellitus or to measure hormone levels.  Performing urine tests to find other underlying health conditions.  Performing an ultrasound exam to check for scarring.  Performing a test to check blood flow to the penis.  Doing a sleep study at home to measure nighttime erections. TREATMENT   You may be prescribed medicines by mouth.  You may be given medicine injections into the penis.  You may be prescribed a vacuum pump with a ring.  Penile implant surgery may be performed. You may receive:  An inflatable implant.  A semirigid implant.  Blood vessel surgery may be performed. HOME CARE INSTRUCTIONS  If you are prescribed oral medicine, you should take the medicine as prescribed. Do not increase the dosage without first discussing it with your physician.  If you are using self-injections, be careful to avoid any veins that are on the surface of the penis. Apply pressure to the injection site for 5 minutes.  If you are using a vacuum pump, make sure you have read the instructions before using it. Discuss any questions with your physician before taking the pump home. SEEK MEDICAL CARE IF:  You experience pain that is not responsive to the pain medicine you have been prescribed.  You experience nausea or vomiting. SEEK IMMEDIATE MEDICAL CARE IF:   When taking oral or injectable medications, you experience an erection that lasts longer than 4 hours. If your physician is unavailable, go to the nearest emergency room for evaluation. An erection that lasts much longer than 4 hours can result in permanent damage to your penis.  You have pain that is severe.  You develop redness, severe pain, or severe swelling of your penis.  You have redness spreading up into your groin or lower abdomen.  You are unable to pass your urine. Document Released: 04/15/2000  Document Revised: 12/19/2012 Document Reviewed: 09/20/2012 [facility] Patient Information 2014 [place].

## 2013-09-09 NOTE — Progress Notes (Signed)
Patient here for F/U generalized pain/MS Patient requesting referral to pain clinic Referred to social worker for PHQ-9 score of 25. Provider aware.

## 2013-09-11 ENCOUNTER — Other Ambulatory Visit: Payer: Self-pay | Admitting: *Deleted

## 2013-09-11 ENCOUNTER — Telehealth: Payer: Self-pay | Admitting: Neurology

## 2013-09-11 ENCOUNTER — Telehealth: Payer: Self-pay | Admitting: *Deleted

## 2013-09-11 DIAGNOSIS — G35 Multiple sclerosis: Secondary | ICD-10-CM

## 2013-09-11 LAB — CBC WITH DIFFERENTIAL/PLATELET
Basophils Absolute: 0.1 10*3/uL (ref 0.0–0.1)
Basophils Relative: 1 % (ref 0–1)
EOS ABS: 0.3 10*3/uL (ref 0.0–0.7)
EOS PCT: 4 % (ref 0–5)
HCT: 43.2 % (ref 39.0–52.0)
HEMOGLOBIN: 15.7 g/dL (ref 13.0–17.0)
LYMPHS ABS: 2 10*3/uL (ref 0.7–4.0)
Lymphocytes Relative: 29 % (ref 12–46)
MCH: 33.4 pg (ref 26.0–34.0)
MCHC: 36.3 g/dL — ABNORMAL HIGH (ref 30.0–36.0)
MCV: 91.9 fL (ref 78.0–100.0)
Monocytes Absolute: 0.4 10*3/uL (ref 0.1–1.0)
Monocytes Relative: 6 % (ref 3–12)
NEUTROS PCT: 60 % (ref 43–77)
Neutro Abs: 4.1 10*3/uL (ref 1.7–7.7)
Platelets: 247 10*3/uL (ref 150–400)
RBC: 4.7 MIL/uL (ref 4.22–5.81)
RDW: 12.3 % (ref 11.5–15.5)
WBC: 6.8 10*3/uL (ref 4.0–10.5)

## 2013-09-11 LAB — COMPLETE METABOLIC PANEL WITH GFR
ALT: 37 U/L (ref 0–53)
AST: 19 U/L (ref 0–37)
Albumin: 5.1 g/dL (ref 3.5–5.2)
Alkaline Phosphatase: 55 U/L (ref 39–117)
BUN: 19 mg/dL (ref 6–23)
CO2: 23 meq/L (ref 19–32)
Calcium: 10.1 mg/dL (ref 8.4–10.5)
Chloride: 103 mEq/L (ref 96–112)
Creat: 1.09 mg/dL (ref 0.50–1.35)
GFR, EST NON AFRICAN AMERICAN: 86 mL/min
GLUCOSE: 90 mg/dL (ref 70–99)
Potassium: 4.2 mEq/L (ref 3.5–5.3)
SODIUM: 138 meq/L (ref 135–145)
TOTAL PROTEIN: 8 g/dL (ref 6.0–8.3)
Total Bilirubin: 1.1 mg/dL (ref 0.2–1.2)

## 2013-09-11 NOTE — Telephone Encounter (Signed)
Patient is aware of the need for a CBC and CMP before starting MS Medication  He will pick up slip today for labs to be drawn this am

## 2013-09-11 NOTE — Telephone Encounter (Signed)
I left patient a voicemail on his contacts phone his has been d/c  To call office back if he had questions

## 2013-09-11 NOTE — Telephone Encounter (Signed)
Pt called requesting to speak to a nurse regarding his condition.

## 2013-09-11 NOTE — Progress Notes (Signed)
Patient ID: Bobby Stafford, male   DOB: 10-Jun-1975, 38 y.o.   MRN: [id number]  CC: Pain on right side  HPI: Patient presents to clinic today with complaints of right side pain. Patient has a history of MS and is currently under the care of a neurologist (Dr. Everlena Stafford). He reports that Dr. Everlena Stafford will not give him pain management and does not explain treatment options very well. He reports that he was prescribed a medication to help control his MS on his last neurology visit but is skeptical about starting treatment due to the side effect profile. Patient reports the pain is becoming more severe over the past 2 months and is causing him depression. He reports the pain as achy and it starts in his right shoulder and extends all way down to his right leg. Patient also reports for the past 2 months he has had a difficult time getting and maintaining an erection.  No Known Allergies Past Medical History  Diagnosis Date  . MS (multiple sclerosis)   . Arthritis    Current Outpatient Prescriptions on File Prior to Visit  Medication Sig Dispense Refill  . AMBULATORY NON FORMULARY MEDICATION 1 Device by Does not apply route daily. Shower seat  1 Device  0  . baclofen (LIORESAL) 5 mg TABS tablet Take 0.5 tablets (5 mg total) by mouth 3 (three) times daily.  90 tablet  3  . gabapentin (NEURONTIN) 300 MG capsule Take 1 capsule (300 mg total) by mouth 3 (three) times daily.  90 capsule  3  . LORazepam (ATIVAN) 1 MG tablet Take 1 tablet (1 mg total) by mouth every 8 (eight) hours as needed (spasms).  90 tablet  0  . oxyCODONE-acetaminophen (PERCOCET/ROXICET) 5-325 MG per tablet Take 1-2 tablets by mouth every 3 (three) hours as needed for moderate pain.  90 tablet  0  . oxyCODONE-acetaminophen (PERCOCET/ROXICET) 5-325 MG per tablet Take 1 tablet by mouth every 4 (four) hours as needed for severe pain.  20 tablet  0  . predniSONE (DELTASONE) 10 MG tablet Take 60 mg tablet and taper down by 10 mg daily until completed   21 tablet  0   No current facility-administered medications on file prior to visit.   Family History  Problem Relation Age of Onset  . Diabetes type I Father    History   Social History  . Marital Status: Single    Spouse Name: N/A    Number of Children: N/A  . Years of Education: N/A   Occupational History  . Not on file.   Social History Main Topics  . Smoking status: Current Every Day Smoker -- 0.75 packs/day for 24 years    Types: Cigarettes  . Smokeless tobacco: Never Used  . Alcohol Use: 12.0 oz/week    20 Cans of beer per week  . Drug Use: 7.00 per week    Special: Marijuana  . Sexual Activity: Yes   Other Topics Concern  . Not on file   Social History Narrative  . No narrative on file    Review of Systems  Constitutional: Negative for fever and chills.  Eyes: Negative for blurred vision.  Respiratory: Negative.   Cardiovascular: Negative.   Gastrointestinal: Negative.   Musculoskeletal: Positive for back pain (spasms down sciatic nerve).       Right side pain (arm extending to leg)  Neurological: Positive for headaches.     Objective:   Filed Vitals:   09/09/13 1444  BP: 116/67  Pulse:  70  Temp: 98.8 F (37.1 C)  Resp: 14    Physical Exam  Vitals reviewed. Constitutional: He is oriented to person, place, and time. He appears well-developed.  Eyes: Pupils are equal, round, and reactive to light.  Neck: Normal range of motion. Neck supple.  Cardiovascular: Normal rate, regular rhythm and normal heart sounds.   Pulmonary/Chest: Effort normal and breath sounds normal.  Abdominal: Soft. Bowel sounds are normal. There is no tenderness.  Musculoskeletal: He exhibits no edema and no tenderness.  Lymphadenopathy:    He has no cervical adenopathy.  Neurological: He is alert and oriented to person, place, and time.  Psychiatric:  Patient anxious and tearful about pain and MS treatment     Lab Results  Component Value Date   WBC 14.7* 07/06/2013    HGB 13.5 07/06/2013   HCT 38.6* 07/06/2013   MCV 94.6 07/06/2013   PLT 253 07/06/2013   Lab Results  Component Value Date   CREATININE 0.92 07/06/2013   BUN 12 07/06/2013   NA 138 07/06/2013   K 4.5 07/06/2013   CL 103 07/06/2013   CO2 25 07/06/2013    No results found for this basename: HGBA1C   Lipid Panel  No results found for this basename: chol, trig, hdl, cholhdl, vldl, ldlcalc       Assessment and plan:   Bobby Stafford was seen today for follow-up, back pain and multiple sclerosis.  Diagnoses and associated orders for this visit:  MS (multiple sclerosis) - Ambulatory referral to Pain Clinic - Ambulatory referral to Home Health for PT/OT. Patient is using walker and is having a hard time using his hands for grip. - acetaminophen-codeine (TYLENOL #3) 300-30 MG per tablet; Take 1 tablet by mouth every 6 (six) hours as needed for moderate pain. Patient is more reassurance when it comes to his MS prognosis and treatment. Explained to patient that he may call back to his neurologist office and explain to them he is feeling and for more education pertaining to newly prescribed medication. Depression Referred patient to Bobby Stafford for counseling.  Preventative health care - Ambulatory referral to Optometry - Ambulatory referral to Dentistry  Impotence - tadalafil (CIALIS) 20 MG tablet; Take 0.5 tablets (10 mg total) by mouth every other day as needed for erectile dysfunction. Explained to patient he should not smoke or use alcohol while using this drug. Advised patient to be cautious when using other medications that may lower the blood pressure while taking Cialis. Explained side effects of Cialis and when patient should seek emergency care.   Return in about 3 months (around 12/10/2013) for routine visit-impotence   Bobby Commons, NP-C [facility] [phone] 09/11/2013, 2:18 PM

## 2013-09-17 ENCOUNTER — Other Ambulatory Visit: Payer: PRIVATE HEALTH INSURANCE

## 2013-09-19 ENCOUNTER — Other Ambulatory Visit: Payer: PRIVATE HEALTH INSURANCE

## 2013-09-20 NOTE — ED Provider Notes (Signed)
Medical screening examination/treatment/procedure(s) were performed by resident physician or non-physician practitioner and as supervising physician I was immediately available for consultation/collaboration.   Barkley Bruns MD.   Linna Hoff, MD 09/20/13 [phone]

## 2013-10-10 ENCOUNTER — Encounter: Payer: Self-pay | Admitting: Internal Medicine

## 2013-10-10 ENCOUNTER — Ambulatory Visit: Payer: Medicaid Other | Attending: Internal Medicine | Admitting: Internal Medicine

## 2013-10-10 VITALS — BP 117/79 | HR 65 | Temp 98.5°F | Resp 16 | Ht 76.5 in | Wt 319.0 lb

## 2013-10-10 DIAGNOSIS — F329 Major depressive disorder, single episode, unspecified: Secondary | ICD-10-CM | POA: Diagnosis not present

## 2013-10-10 DIAGNOSIS — IMO0002 Reserved for concepts with insufficient information to code with codable children: Secondary | ICD-10-CM | POA: Insufficient documentation

## 2013-10-10 DIAGNOSIS — Z79899 Other long term (current) drug therapy: Secondary | ICD-10-CM | POA: Diagnosis not present

## 2013-10-10 DIAGNOSIS — F17200 Nicotine dependence, unspecified, uncomplicated: Secondary | ICD-10-CM | POA: Insufficient documentation

## 2013-10-10 DIAGNOSIS — G35 Multiple sclerosis: Secondary | ICD-10-CM

## 2013-10-10 DIAGNOSIS — F3289 Other specified depressive episodes: Secondary | ICD-10-CM

## 2013-10-10 DIAGNOSIS — F32A Depression, unspecified: Secondary | ICD-10-CM

## 2013-10-10 DIAGNOSIS — M1710 Unilateral primary osteoarthritis, unspecified knee: Secondary | ICD-10-CM | POA: Insufficient documentation

## 2013-10-10 LAB — HEMOGLOBIN A1C
HEMOGLOBIN A1C: 4.9 % (ref <5.7)
MEAN PLASMA GLUCOSE: 94 mg/dL (ref <117)

## 2013-10-10 LAB — CBC WITH DIFFERENTIAL/PLATELET
BASOS ABS: 0 10*3/uL (ref 0.0–0.1)
BASOS PCT: 0 % (ref 0–1)
Eosinophils Absolute: 0.2 10*3/uL (ref 0.0–0.7)
Eosinophils Relative: 4 % (ref 0–5)
HCT: 40.4 % (ref 39.0–52.0)
HEMOGLOBIN: 14.5 g/dL (ref 13.0–17.0)
Lymphocytes Relative: 32 % (ref 12–46)
Lymphs Abs: 1.9 10*3/uL (ref 0.7–4.0)
MCH: 32.9 pg (ref 26.0–34.0)
MCHC: 35.9 g/dL (ref 30.0–36.0)
MCV: 91.6 fL (ref 78.0–100.0)
MONOS PCT: 7 % (ref 3–12)
Monocytes Absolute: 0.4 10*3/uL (ref 0.1–1.0)
NEUTROS PCT: 57 % (ref 43–77)
Neutro Abs: 3.4 10*3/uL (ref 1.7–7.7)
Platelets: 242 10*3/uL (ref 150–400)
RBC: 4.41 MIL/uL (ref 4.22–5.81)
RDW: 12.4 % (ref 11.5–15.5)
WBC: 5.9 10*3/uL (ref 4.0–10.5)

## 2013-10-10 MED ORDER — TRAMADOL HCL 50 MG PO TABS: 50.0000 mg | ORAL_TABLET | Freq: Three times a day (TID) | ORAL | Status: DC | PRN

## 2013-10-10 NOTE — Progress Notes (Signed)
Pt is here following up on his widespread pain. Pt reports being in severe pain in his knees, thighs, arms w/ extreme pain causing him have lack of mobility. Pt noticed yesterday that his toenail has turned black and is about to fall off.

## 2013-10-10 NOTE — Patient Instructions (Signed)
Multiple Sclerosis  Multiple sclerosis (MS) is a disease of the central nervous system. It leads to loss of the insulating covering of the nerves (myelin sheath) of your brain. When this happens, brain signals do not get transmitted properly or may not get transmitted at all. The symptoms of MS occur in episodes or attacks. These attacks may last weeks to months. There may be long periods of nearly no problems between attacks. The age of onset of MS varies.   CAUSES  The cause of MS is unknown. However, it is more common in the [place] than in the southern [place].  RISK FACTORS  There is a higher incidence of MS in women than in men. MS is not an inherited illness, although your risk of MS is higher if you have a relative with MS.  SIGNS AND SYMPTOMS   The symptoms of MS occur in episodes or attacks. These attacks may last weeks to months. There may be long periods of almost no symptoms between attacks.  The symptoms of MS vary. This is because of the many different ways it affects the central nervous system. The main symptoms of MS include:   Vision problems and eye pain.   Numbness.   Weakness.   Paralysis in your arms, hands, feet, and legs (extremities).   Balance problems.   Tremors.  DIAGNOSIS   Your health care provider can diagnose MS with the help of imaging exams and lab tests. These may include specialized X-ray exams and spinal fluid tests. The best imaging exam to confirm a diagnosis of MS is MRI.  TREATMENT   There is no known cure for MS, but there are medicines that can decrease the number and frequency of attacks. Steroids are often used for short-term relief. Physical and occupational therapy may also help.  HOME CARE INSTRUCTIONS    Take medicines as directed by your health care provider.   Exercise as directed by your health care provider.  SEEK MEDICAL CARE IF:  You begin to feel depressed.  SEEK IMMEDIATE MEDICAL CARE IF:   You develop paralysis.   You develop  problems with bladder, bowel, or sexual function.   You develop mental changes, such as forgetfulness or mood swings.   You have a seizure.  Document Released: 04/15/2000 Document Revised: 02/06/2013 Document Reviewed: 12/24/2012  [employer] Patient Information 2014 [employer].

## 2013-10-10 NOTE — Progress Notes (Signed)
Patient ID: Mico Kneifl, male   DOB: 1975-12-05, 38 y.o.   MRN: [id number]   Zebastian Beahan, is a 38 y.o. male  QAS:[id number]  HKF:[id number]  DOB - 11/07/1975  Chief Complaint  Patient presents with  . Follow-up        Subjective:   Roderick Mcmanigal is a 38 y.o. male here today for a follow up visit. Patient is known to have multiple sclerosis and osteoarthritis of both knees, on medications as prescribed by neurologist including dimethyl Fumarate. He is here today for followup at to request pain management. Patient has been fired from numerous pain clinics, he claims he uses marijuana for pain at the moment, neurologist had declined the prescription for narcotics because it's not the medication or treatment for multiple sclerosis. Patient is able to ambulate but with cane, has occasional spasm. Lately however his wife has been giving him apple cider with water was someone recommended to her for spasm. There is no new symptoms today. Patient wants to be referred to an eye doctor as well as pain clinic and is requesting a knee brace. Patient has No headache, No chest pain, No abdominal pain - No Nausea, No new weakness tingling or numbness, No Cough - SOB.  No problems updated.  ALLERGIES: No Known Allergies  PAST MEDICAL HISTORY: Past Medical History  Diagnosis Date  . MS (multiple sclerosis)   . Arthritis     MEDICATIONS AT HOME: Prior to Admission medications   Medication Sig Start Date End Date Taking? Authorizing Provider  acetaminophen-codeine (TYLENOL #3) 300-30 MG per tablet Take 1 tablet by mouth every 6 (six) hours as needed for moderate pain. 09/09/13  Yes Ambrose Finland, NP  baclofen (LIORESAL) 5 mg TABS tablet Take 0.5 tablets (5 mg total) by mouth 3 (three) times daily. 08/16/13  Yes Adam Gus Rankin, DO  cholecalciferol (VITAMIN D) 1000 UNITS tablet Take 4,000 Units by mouth daily. 08/26/13  Yes Historical Provider, MD  Dimethyl Fumarate (TECFIDERA) 240 MG CPDR Take 240  mg by mouth.   Yes Historical Provider, MD  gabapentin (NEURONTIN) 300 MG capsule Take 1 capsule (300 mg total) by mouth 3 (three) times daily. 08/16/13  Yes Adam Gus Rankin, DO  tadalafil (CIALIS) 20 MG tablet Take 0.5 tablets (10 mg total) by mouth every other day as needed for erectile dysfunction. 09/09/13  Yes Ambrose Finland, NP  AMBULATORY NON FORMULARY MEDICATION 1 Device by Does not apply route daily. Shower seat 08/16/13   Cira Servant, DO  LORazepam (ATIVAN) 1 MG tablet Take 1 tablet (1 mg total) by mouth every 8 (eight) hours as needed (spasms). 07/09/13   Dorothea Ogle, MD  naproxen sodium (ANAPROX) 220 MG tablet Take 220 mg by mouth 2 (two) times daily with a meal.    Historical Provider, MD  oxyCODONE-acetaminophen (PERCOCET/ROXICET) 5-325 MG per tablet Take 1-2 tablets by mouth every 3 (three) hours as needed for moderate pain. 07/16/13   Dorothea Ogle, MD  predniSONE (DELTASONE) 10 MG tablet Take 60 mg tablet and taper down by 10 mg daily until completed 07/09/13   Dorothea Ogle, MD  traMADol (ULTRAM) 50 MG tablet Take 1 tablet (50 mg total) by mouth every 8 (eight) hours as needed. 10/10/13   Jeanann Lewandowsky, MD     Objective:   Filed Vitals:   10/10/13 0917  BP: 117/79  Pulse: 65  Temp: 98.5 F (36.9 C)  TempSrc: Oral  Resp: 16  Height: 6' 4.5" (1.943  m)  Weight: 319 lb (144.697 kg)  SpO2: 97%    Exam General appearance : Awake, alert, not in any distress. Speech Clear. Not toxic looking HEENT: Atraumatic and Normocephalic, pupils equally reactive to light and accomodation Neck: supple, no JVD. No cervical lymphadenopathy.  Chest:Good air entry bilaterally, no added sounds  CVS: S1 S2 regular, no murmurs.  Abdomen: Bowel sounds present, Non tender and not distended with no gaurding, rigidity or rebound. Extremities: Ambulates with cane, B/L Lower Ext shows no edema, both legs are warm to touch Neurology: Awake alert, and oriented X 3, CN II-XII intact, Non  focal Skin:No Rash Wounds:N/A  Data Review No results found for this basename: HGBA1C     Assessment & Plan   1. MS (multiple sclerosis) Patient was extensively counseled and educated about diagnosis of multiple sclerosis and its course  - CBC with Differential - Hemoglobin A1c - Ambulatory referral to Physical Therapy - Ambulatory referral to Pain Clinic - Knee brace - Ambulatory referral to Ophthalmology  Tramadol 50 mg tablet by mouth Q8 hour when necessary pain  2. Depression Patient was counseled  Patient was extensively counseled about nutrition and exercise Patient was extensively counseled about smoking cessation  Return in about 6 months (around 04/11/2014), or if symptoms worsen or fail to improve, for Routine Follow Up, Follow up Pain and comorbidities.  The patient was given clear instructions to go to ER or return to medical center if symptoms don't improve, worsen or new problems develop. The patient verbalized understanding. The patient was told to call to get lab results if they haven't heard anything in the next week.   This note has been created with Education officer, environmentalDragon speech recognition software and smart phrase technology. Any transcriptional errors are unintentional.    Jeanann LewandowskyJEGEDE, Mylez Venable, MD, MHA, FACP, FAAP [facility] [place], [place] [phone]   10/10/2013, 10:28 AM

## 2013-10-11 ENCOUNTER — Telehealth: Payer: Self-pay | Admitting: Emergency Medicine

## 2013-10-11 NOTE — Telephone Encounter (Signed)
Pt given negative lab results 

## 2013-10-11 NOTE — Telephone Encounter (Signed)
Message copied by Darlis Loan on Fri Oct 11, 2013 12:34 PM ------      Message from: Quentin Angst      Created: Fri Oct 11, 2013 10:03 AM       Please inform patient that his laboratory tests results are all normal ------

## 2013-10-16 ENCOUNTER — Emergency Department (HOSPITAL_COMMUNITY)
Admission: EM | Admit: 2013-10-16 | Discharge: 2013-10-16 | Disposition: A | Discharge status: Home / Self Care | Payer: PRIVATE HEALTH INSURANCE | Attending: Emergency Medicine | Admitting: Emergency Medicine

## 2013-10-16 ENCOUNTER — Encounter (HOSPITAL_COMMUNITY): Payer: Self-pay | Admitting: Emergency Medicine

## 2013-10-16 DIAGNOSIS — F3289 Other specified depressive episodes: Secondary | ICD-10-CM | POA: Insufficient documentation

## 2013-10-16 DIAGNOSIS — M129 Arthropathy, unspecified: Secondary | ICD-10-CM | POA: Insufficient documentation

## 2013-10-16 DIAGNOSIS — X30XXXA Exposure to excessive natural heat, initial encounter: Secondary | ICD-10-CM | POA: Insufficient documentation

## 2013-10-16 DIAGNOSIS — F329 Major depressive disorder, single episode, unspecified: Secondary | ICD-10-CM

## 2013-10-16 DIAGNOSIS — R61 Generalized hyperhidrosis: Secondary | ICD-10-CM | POA: Insufficient documentation

## 2013-10-16 DIAGNOSIS — G35 Multiple sclerosis: Secondary | ICD-10-CM | POA: Insufficient documentation

## 2013-10-16 DIAGNOSIS — Y9301 Activity, walking, marching and hiking: Secondary | ICD-10-CM | POA: Insufficient documentation

## 2013-10-16 DIAGNOSIS — F32A Depression, unspecified: Secondary | ICD-10-CM

## 2013-10-16 DIAGNOSIS — Y9289 Other specified places as the place of occurrence of the external cause: Secondary | ICD-10-CM | POA: Insufficient documentation

## 2013-10-16 DIAGNOSIS — Z79899 Other long term (current) drug therapy: Secondary | ICD-10-CM | POA: Insufficient documentation

## 2013-10-16 DIAGNOSIS — F17200 Nicotine dependence, unspecified, uncomplicated: Secondary | ICD-10-CM | POA: Insufficient documentation

## 2013-10-16 DIAGNOSIS — T675XXA Heat exhaustion, unspecified, initial encounter: Secondary | ICD-10-CM

## 2013-10-16 DIAGNOSIS — T673XXA Heat exhaustion, anhydrotic, initial encounter: Secondary | ICD-10-CM | POA: Insufficient documentation

## 2013-10-16 LAB — COMPREHENSIVE METABOLIC PANEL
ALK PHOS: 55 U/L (ref 39–117)
ALT: 69 U/L — ABNORMAL HIGH (ref 0–53)
AST: 31 U/L (ref 0–37)
Albumin: 4.9 g/dL (ref 3.5–5.2)
BUN: 14 mg/dL (ref 6–23)
CHLORIDE: 106 meq/L (ref 96–112)
CO2: 21 mEq/L (ref 19–32)
Calcium: 10.1 mg/dL (ref 8.4–10.5)
Creatinine, Ser: 1.14 mg/dL (ref 0.50–1.35)
GFR calc non Af Amer: 80 mL/min — ABNORMAL LOW (ref >90)
GLUCOSE: 92 mg/dL (ref 70–99)
Potassium: 3.8 mEq/L (ref 3.7–5.3)
Sodium: 144 mEq/L (ref 137–147)
Total Bilirubin: 1.1 mg/dL (ref 0.3–1.2)
Total Protein: 8.3 g/dL (ref 6.0–8.3)

## 2013-10-16 LAB — URINE MICROSCOPIC-ADD ON

## 2013-10-16 LAB — CBC WITH DIFFERENTIAL/PLATELET
Basophils Absolute: 0 10*3/uL (ref 0.0–0.1)
Basophils Relative: 0 % (ref 0–1)
EOS PCT: 1 % (ref 0–5)
Eosinophils Absolute: 0.1 10*3/uL (ref 0.0–0.7)
HCT: 41 % (ref 39.0–52.0)
Hemoglobin: 14.2 g/dL (ref 13.0–17.0)
LYMPHS PCT: 16 % (ref 12–46)
Lymphs Abs: 1.5 10*3/uL (ref 0.7–4.0)
MCH: 32.5 pg (ref 26.0–34.0)
MCHC: 34.6 g/dL (ref 30.0–36.0)
MCV: 93.8 fL (ref 78.0–100.0)
Monocytes Absolute: 0.8 10*3/uL (ref 0.1–1.0)
Monocytes Relative: 8 % (ref 3–12)
NEUTROS ABS: 7.5 10*3/uL (ref 1.7–7.7)
Neutrophils Relative %: 75 % (ref 43–77)
PLATELETS: 239 10*3/uL (ref 150–400)
RBC: 4.37 MIL/uL (ref 4.22–5.81)
RDW: 11.8 % (ref 11.5–15.5)
WBC: 9.9 10*3/uL (ref 4.0–10.5)

## 2013-10-16 LAB — URINALYSIS, ROUTINE W REFLEX MICROSCOPIC
BILIRUBIN URINE: NEGATIVE
Glucose, UA: NEGATIVE mg/dL
Hgb urine dipstick: NEGATIVE
Ketones, ur: 40 mg/dL — AB
NITRITE: NEGATIVE
PROTEIN: NEGATIVE mg/dL
Specific Gravity, Urine: 1.036 — ABNORMAL HIGH (ref 1.005–1.030)
UROBILINOGEN UA: 1 mg/dL (ref 0.0–1.0)
pH: 5.5 (ref 5.0–8.0)

## 2013-10-16 LAB — CK: Total CK: 206 U/L (ref 7–232)

## 2013-10-16 MED ORDER — SODIUM CHLORIDE 0.9 % IV SOLN
1000.0000 mL | Freq: Once | INTRAVENOUS | Status: AC
Start: 2013-10-16 — End: 2013-10-16
  Administered 2013-10-16: 1000 mL via INTRAVENOUS

## 2013-10-16 MED ORDER — SODIUM CHLORIDE 0.9 % IV SOLN
1000.0000 mL | INTRAVENOUS | Status: DC
  Administered 2013-10-16: 1000 mL via INTRAVENOUS

## 2013-10-16 NOTE — ED Notes (Signed)
Case management at bedside, will discharge when case management leaves.

## 2013-10-16 NOTE — Progress Notes (Signed)
[facility] CL spoke with patient about [employer]. Patient is currently being seen at [facility]. Patient expressed concern over the fact that he lost his wallet this morning with [facility] Card, photo ID and SS card. CL provided pt with resources for receiving a photo ID ([employer] and [employer]) and messaged [facility] about getting  Patient a duplicate [employer]. CL went back to see patient and explain resources but patient was sleeping, so left paperwork for [employer] in patient room. CL unable to schedule patient a f/u apt at this time, but will continue to try and get patient an apt.

## 2013-10-16 NOTE — ED Notes (Signed)
Bed: MW10 Expected date:  Expected time:  Means of arrival:  Comments: EMS-fall-hypotensive

## 2013-10-16 NOTE — ED Notes (Signed)
Per EMS: pt fell to ground 3 times today, called EMS. Pt has right sided numbness due to MS. When EMS arrived pt hypotension, BP stable. 500 ml of fluids 18g left hand.

## 2013-10-16 NOTE — Discharge Instructions (Signed)
Try not to be out in the heat for so long, drink lots of fluids so you don't get dehydrated again. Follow up with [facility] to discuss your depression about your MS. Return to the ED if you feel you may do something to harm yourself or someone else.    Heat Disorders Heat related disorders are illnesses caused by continued exposure to hot and humid environments, not drinking enough fluids, and/or your body failing to regulate its temperature correctly. People suffer from heat stress and heat related disorders when their bodies are unable to compensate and cool down through sweating. With sufficient heat, sweating is not enough to keep you cool, and your body temperature can rise quickly. Very high body temperatures can damage your brain and other vital organs. High humidity (moisture in the air), adds to heat stress, because it is harder for sweat to evaporate and cool your body. Heat stress and disorders are not uncommon. Some medicines can increase your risk for heat related illness. Ask your caregiver about your medicines during periods of intense heat.  Heat related disorders include:  Heatstroke. When you cannot sweat or regulate your body temperature in an adequate way. This is very dangerous and can be life threatening. Get emergency medical help.  Heat exhaustion. Overheating causes heavy sweating and a fast heart rate. Your body can still regulate its own temperature.  Heat cramps. Painful, uncontrollable muscle spasms. Can occur during heavy exercise in hot environments.  Sunburn. Skin becomes red and painful (burned) after being out in the sun.  Heat rash. Sweat ducts become blocked, which traps sweat under the skin. This causes blisters and red bumps and may cause an itchy or tingling feeling. PREVENTING HEAT STRESS AND HEAT RELATED DISORDERS Overheating can be dangerous and life threatening. When exercising, working, or doing other activities in hot and humid environments, do the  following:  Stay informed by listening to and watching local broadcast weather and safety updates during intense heat.  Air conditioning is the best way to prevent heat disorders. If your home is not air conditioned, spend time in air conditioned places (malls, [employer] libraries, or heat shelters set up by your local health department).  Wear light-weight, light colored, loose fitting clothing. Wear as little clothing as possible when at home.  Increase your fluid intake. Drink enough water and fluids to keep your urine clear or pale yellow. DO NOT WAIT UNTIL YOU ARE THIRSTY TO DRINK. You may already be heat stressed, and not recognize it.  If your caregiver has suggested that you limit the amount of fluid you drink or has prescribed water pills for a medical problem, ask how much you should drink when the weather is hot.  Do not drink liquids with alcohol, caffeine, or lots of sugar. They can cause more loss of body fluid.  Heavy sweating drains your body's salt and minerals, which must be replaced. If you must exercise in the heat, a sports beverage can replace the salt and minerals you lose in sweat. If you are on a low-salt diet, check with your caregiver before drinking a sports beverage.  Sunburn reduces your body's ability to cool itself and causes a loss of needed body fluids. If you go outdoors, protect yourself from the sun by wearing a wide-brimmed hat, along with sunglasses.  Put on sunscreen of SPF 15 or higher, 30 minutes before going out. (The most effective products say "broad spectrum" or "UVA/UVB protection" on the label.) Reapply sunscreen frequently -- at least  every 1-2 hours.  Take added precautions when both the heat and humidity are high.  Rest often.  Even young and otherwise healthy people can become heat stressed and suffer from a heat disorder, if they participate in strenuous activities during hot weather.  If you must be outdoors, try going out only during  morning and evening hours, when it is cooler. Rest often in shady areas, so that your body's temperature can adjust.  If your heart pounds or you are gasping for breath, STOP all activity. Go immediately to a cool area, or at least into the shade, and rest. This is especially true if you become lightheaded, confused, weak, or faint.  Electric fans may make you comfortable, but they DO NOT prevent heat related problems. SYMPTOMS   Headache.  Nosebleed.  Weakness.  You feel very hot.  Muscle cramps.  Restlessness.  Fainting or dizziness.  Fast breathing and shortness of breath.  Excessive sweating. (There may be little or no sweating in late stages of heat exhaustion.)  Rapid pulse, heart pounding.  Feeling sick to your stomach (nauseous, vomiting).  Skin becoming cold and clammy, or excessively hot and dry. HOME CARE INSTRUCTIONS   Lie down and rest in a cool or air conditioned area.  Drink enough water and fluids to keep your urine clear or pale yellow. Avoid fluids with caffeine or high sugar content. Avoid coffee, tea, alcohol or stimulants.  Do not take salt tablets, unless advised by your caregiver.  Avoid hot foods and heavy meals.  Bathe or shower in cool water.  Wear minimal clothing.  Use a fan. Add cool or warm mist to the air, if possible.  If possible, decrease the use of your stove or oven at home.  Monitor adults at risk at least twice a day, watching closely for signs of heat exhaustion or heat stroke. Infants and young children also require more frequent watching.  Never leave infants, children or pets in a parked car, even if the windows are cracked open.  If you are 30 years of age or older, have a friend or relative call to check on you twice a day during a heat wave. If you know someone in this age group, check on them at least twice a day. SEEK IMMEDIATE MEDICAL CARE IF:  You have a hard time breathing.  You vomit or pass blood in your  stool.  You have a seizure, feel dizzy or faint, or pass out.  You develop severe sweating.  Your skin is red, hot and dry (there is no sweating).  Your urine turns a dark color or has blood in it.  You are making very little or no urine.  You are unable to keep fluids down.  You develop chest or abdominal pain.  You develop a throbbing headache.  You develop nausea or confusion. IF YOU OBSERVE SOMEONE WHO MIGHT HAVE HEAT STROKE This can be life threatening. Call your local emergency services (911 in the [place]  If the victim is in the sun, get him or her to a shady area.  Cool the victim rapidly, using whatever methods you have:  Place the victim in a tub of cool water or a cool shower.  Spray the victim with cool water from a garden hose, or sponge the person with cool water.  Wrap the victim in a cool, wet sheet and fan them.  If emergency medical help is delayed, call the hospital emergency room or your local emergency services (911  in the [place] for further instructions.  Sometimes a victim's muscles will begin to twitch from heat stroke. If this happens, keep the victim from injuring himself. However, do not place any object in the mouth. Give fluids, unless the muscle twitching makes it difficult or unsafe to do so. If there is vomiting, make sure the airway remains open by turning the victim on his or her side. Document Released: 04/15/2000 Document Revised: 07/11/2011 Document Reviewed: 02/02/2009 [facility] Patient Information 2015 [place], [place]. This information is not intended to replace advice given to you by your health care provider. Make sure you discuss any questions you have with your health care provider.
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
## 2013-10-16 NOTE — ED Provider Notes (Signed)
CSN: [id number]     Arrival date & time 10/16/13  1411 History   First MD Initiated Contact with Patient 10/16/13 1452     Chief Complaint  Patient presents with  . Hypotension     (Consider location/radiation/quality/duration/timing/severity/associated sxs/prior Treatment) HPI Patient reports he was diagnosed with MS in March when he started having right-sided numbness. He states his girlfriend several years and he had a argument yesterday and she left. She states she took a lot of household items. He reports today he found out his male was being held. He states he attempted to walk to the post office in the heat, it is currently 96 outside. He states he started walking about 11:15 AM. He reports he had to stop frequently and sit initiated, in the curves, or where ever he could. He reports a lot of sweating. He states he fell at least twice. Once he was walking up a hill. The second time he fell while walking on an uneven parking lot. At that point he was unable to get up. He was scooting on his buttock across the parking lot. He reports the rocks were very hot and he was using and how to sit on. He states he felt very lightheaded and dizzy. He denied chest pain but did have some shortness of breath. He describes a frontal and temporal headache described as achy. He has had double vision since March when he was diagnosed with MS but is not improved. He states since he was diagnosed with MS his equilibrium is off. He states he feels afraid to go to sleep his "I'm afraid I will wake up". He has some nausea today without vomiting. Patient reports when he was in the parking lot and somebody came by and helped him get up and they called EMS. EMS reported to him his blood pressure was initially 85 systolic. He was given IV fluids.   PCP Dr Hyman Hopes Neurology Dr Everlena Cooper  Past Medical History  Diagnosis Date  . MS (multiple sclerosis)   . Arthritis    Past Surgical History  Procedure Laterality Date  .  No surgerys     Family History  Problem Relation Age of Onset  . Diabetes type I Father    History  Substance Use Topics  . Smoking status: Current Every Day Smoker -- 0.75 packs/day for 24 years    Types: Cigarettes  . Smokeless tobacco: Never Used  . Alcohol Use: 12.0 oz/week    20 Cans of beer per week   unemployed since March Patient states his family is in [place] Patient moved to [place] from [place] a year ago.  Review of Systems  All other systems reviewed and are negative.     Allergies  Review of patient's allergies indicates no known allergies.  Home Medications   Prior to Admission medications   Medication Sig Start Date End Date Taking? Authorizing Inez Stantz  acetaminophen-codeine (TYLENOL #3) 300-30 MG per tablet Take 1 tablet by mouth every 6 (six) hours as needed for moderate pain. 09/09/13  Yes Ambrose Finland, NP  baclofen (LIORESAL) 5 mg TABS tablet Take 0.5 tablets (5 mg total) by mouth 3 (three) times daily. 08/16/13  Yes Adam Gus Rankin, DO  cholecalciferol (VITAMIN D) 1000 UNITS tablet Take 4,000 Units by mouth daily. 08/26/13  Yes Historical [name], MD  Dimethyl Fumarate (TECFIDERA) 240 MG CPDR Take 240 mg by mouth every morning.    Yes Historical Chadric Kimberley, MD  gabapentin (NEURONTIN) 300 MG capsule  Take 1 capsule (300 mg total) by mouth 3 (three) times daily. 08/16/13  Yes Adam Gus Rankinobert Jaffe, DO  LORazepam (ATIVAN) 1 MG tablet Take 1 tablet (1 mg total) by mouth every 8 (eight) hours as needed (spasms). 07/09/13  Yes Dorothea OgleIskra M Myers, MD  naproxen sodium (ANAPROX) 220 MG tablet Take 220 mg by mouth 2 (two) times daily as needed (pain).    Yes Historical Adley Mazurowski, MD  tadalafil (CIALIS) 20 MG tablet Take 0.5 tablets (10 mg total) by mouth every other day as needed for erectile dysfunction. 09/09/13  Yes Ambrose FinlandValerie A Keck, NP  traMADol (ULTRAM) 50 MG tablet Take 1 tablet (50 mg total) by mouth every 8 (eight) hours as needed. 10/10/13  Yes Jeanann Lewandowskylugbemiga Jegede,  MD   BP 119/58  Pulse 92  Temp(Src) 98.7 F (37.1 C) (Oral)  Resp 20  SpO2 98%  Vital signs normal   Physical Exam  Nursing note and vitals reviewed. Constitutional: He is oriented to person, place, and time. He appears well-developed and well-nourished.  Non-toxic appearance. He does not appear ill. No distress.  HENT:  Head: Normocephalic and atraumatic.  Right Ear: External ear normal.  Left Ear: External ear normal.  Nose: Nose normal. No mucosal edema or rhinorrhea.  Mouth/Throat: Oropharynx is clear and moist and mucous membranes are normal. No dental abscesses or uvula swelling.  Eyes: Conjunctivae and EOM are normal. Pupils are equal, round, and reactive to light.  Neck: Normal range of motion and full passive range of motion without pain. Neck supple.  Cardiovascular: Normal rate, regular rhythm and normal heart sounds.  Exam reveals no gallop and no friction rub.   No murmur heard. Pulmonary/Chest: Effort normal and breath sounds normal. No respiratory distress. He has no wheezes. He has no rhonchi. He has no rales. He exhibits no tenderness and no crepitus.  Abdominal: Soft. Normal appearance and bowel sounds are normal. He exhibits no distension. There is no tenderness. There is no rebound and no guarding.  Musculoskeletal: Normal range of motion. He exhibits no edema and no tenderness.  Moves all extremities well.   Neurological: He is alert and oriented to person, place, and time. He has normal strength. No cranial nerve deficit.  Skin: Skin is warm and intact. No rash noted. No erythema. No pallor.  Patient was diaphoretic when I first entered the room.  Psychiatric:  Patient starts crying during the course of our interview. He states he's having trouble dealing with his diagnosis of MS. He does not have suicidal or homicidal ideation.    ED Course  Procedures (including critical care time)  Medications  0.9 %  sodium chloride infusion (0 mLs Intravenous Stopped  10/16/13 1706)    Followed by  0.9 %  sodium chloride infusion (0 mLs Intravenous Stopped 10/16/13 1747)    Followed by  0.9 %  sodium chloride infusion (1,000 mLs Intravenous New Bag/Given 10/16/13 1747)   Patient given IV fluids until he had good urine output. Patient was able to eat and drink without difficulty.  Patient became very tearful during his interview. He states his caregiver  left yesterday. States he doesn't have anybody to help take care of him. States he does not have transportation to get to see his doctors. States he's very depressed about his diagnosis. Patient expresses interest in being evaluated by mental health. He also is interested in speaking to the Child psychotherapistsocial worker and Sports coachcase manager.  Patient ED visit was prolonged waiting to be evaluated by the  case Production designer, theatre/television/film and Child psychotherapist. He was discussed with TSS, however they told the nursing staff he did not meet their criteria for being evaluated. He was given referral to [facility].  Labs Review Results for orders placed during the hospital encounter of 10/16/13  CBC WITH DIFFERENTIAL      Result Value Ref Range   WBC 9.9  4.0 - 10.5 K/uL   RBC 4.37  4.22 - 5.81 MIL/uL   Hemoglobin 14.2  13.0 - 17.0 g/dL   HCT 16.1  09.6 - 04.5 %   MCV 93.8  78.0 - 100.0 fL   MCH 32.5  26.0 - 34.0 pg   MCHC 34.6  30.0 - 36.0 g/dL   RDW 40.9  81.1 - 91.4 %   Platelets 239  150 - 400 K/uL   Neutrophils Relative % 75  43 - 77 %   Neutro Abs 7.5  1.7 - 7.7 K/uL   Lymphocytes Relative 16  12 - 46 %   Lymphs Abs 1.5  0.7 - 4.0 K/uL   Monocytes Relative 8  3 - 12 %   Monocytes Absolute 0.8  0.1 - 1.0 K/uL   Eosinophils Relative 1  0 - 5 %   Eosinophils Absolute 0.1  0.0 - 0.7 K/uL   Basophils Relative 0  0 - 1 %   Basophils Absolute 0.0  0.0 - 0.1 K/uL  COMPREHENSIVE METABOLIC PANEL      Result Value Ref Range   Sodium 144  137 - 147 mEq/L   Potassium 3.8  3.7 - 5.3 mEq/L   Chloride 106  96 - 112 mEq/L   CO2 21  19 - 32 mEq/L   Glucose,  Bld 92  70 - 99 mg/dL   BUN 14  6 - 23 mg/dL   Creatinine, Ser 7.82  0.50 - 1.35 mg/dL   Calcium 95.6  8.4 - 21.3 mg/dL   Total Protein 8.3  6.0 - 8.3 g/dL   Albumin 4.9  3.5 - 5.2 g/dL   AST 31  0 - 37 U/L   ALT 69 (*) 0 - 53 U/L   Alkaline Phosphatase 55  39 - 117 U/L   Total Bilirubin 1.1  0.3 - 1.2 mg/dL   GFR calc non Af Amer 80 (*) >90 mL/min   GFR calc Af Amer >90  >90 mL/min  CK      Result Value Ref Range   Total CK 206  7 - 232 U/L  URINALYSIS, ROUTINE W REFLEX MICROSCOPIC      Result Value Ref Range   Color, Urine AMBER (*) YELLOW   APPearance CLEAR  CLEAR   Specific Gravity, Urine 1.036 (*) 1.005 - 1.030   pH 5.5  5.0 - 8.0   Glucose, UA NEGATIVE  NEGATIVE mg/dL   Hgb urine dipstick NEGATIVE  NEGATIVE   Bilirubin Urine NEGATIVE  NEGATIVE   Ketones, ur 40 (*) NEGATIVE mg/dL   Protein, ur NEGATIVE  NEGATIVE mg/dL   Urobilinogen, UA 1.0  0.0 - 1.0 mg/dL   Nitrite NEGATIVE  NEGATIVE   Leukocytes, UA TRACE (*) NEGATIVE  URINE MICROSCOPIC-ADD ON      Result Value Ref Range   WBC, UA 0-2  <3 WBC/hpf   Bacteria, UA RARE  RARE   Casts HYALINE CASTS (*) NEGATIVE   Urine-Other MUCOUS PRESENT     Laboratory interpretation all normal      Imaging Review No results found.   EKG Interpretation None  MDM   Final diagnoses:  Heat exhaustion  Depression     Plan discharge  Devoria Albe, MD, Franz Dell, MD 10/16/13 [phone]

## 2013-10-16 NOTE — ED Notes (Signed)
Case management at bedside.

## 2013-10-16 NOTE — Progress Notes (Signed)
  CARE MANAGEMENT ED NOTE 10/16/2013  Patient:  Nolon BussingMOBLEY,[place]   Account Number:  [id number]  Date Initiated:  10/16/2013  Documentation initiated by:  Radford PaxFERRERO,AMY  Subjective/Objective Assessment:   Patient presents to Ed post fall x3 today, weakness to right side due to MS and hypotension.     Subjective/Objective Assessment Detail:     Action/Plan:   Action/Plan Detail:   Anticipated DC Date:  10/16/2013     Status Recommendation to Physician:   Result of Recommendation:    Other ED Services  Consult Working Plan    DC Planning Services  CM consult  Other    Choice offered to / List presented to:            Status of service:  Completed, signed off  ED Comments:   ED Comments Detail:  [facility] as patient has difficulty getting to his appointments.  EDCM spoke to patient at bedisde.  Patient reports that he has found his wallet.  [facility] and [facility] letter which expires July 3rd 2015. Patient informed EDCM that he has "filled out a form" and the [facility] for assistance with [facility].  Patient reports that he has spoken to a Artistfinancial counselor [facility] and they have assistaed him in applying for Medicaid.  Patient reports he has applied for disability and reports, "They said I should receive somrthing before September third."  Patient also haveing issues paying for his utility bills and reports a break up with his girlfriend.  Patient reports his phone will be turned off by the tenth of next month.  Patient's phone number is [phone].  [facility] consulted EDSW for financial issues and transportation issues.  EDCM will also email social worker at [facility] [place] regarding transportation issues and financial issues.  [facility] provided patient with phone number to [facility] and phone number to [facility] worker [facility].  EDCM offered to provide patient with [facility] [facility]  bu patient refused. Patient reports he is ambulatory with a cane.  Patient reports he has a walker at home, but prefers to use his cane.  [facility].  [facility] will email K. Leticia ClasRivera [facility] to arrange an appointment sooner.  Patient thankful for assistance.  No further EDCM needs at this time.

## 2013-10-16 NOTE — ED Notes (Signed)
Pt given urinal and made aware of need for urine specimen 

## 2013-10-16 NOTE — ED Notes (Signed)
Pt is aware of the need for urine, urinal at bedside  

## 2013-10-24 ENCOUNTER — Encounter: Payer: Self-pay | Admitting: Neurology

## 2013-10-24 ENCOUNTER — Ambulatory Visit (INDEPENDENT_AMBULATORY_CARE_PROVIDER_SITE_OTHER): Payer: Self-pay | Admitting: Neurology

## 2013-10-24 VITALS — BP 138/78 | HR 82 | Temp 98.4°F | Resp 20 | Ht 76.0 in | Wt 322.2 lb

## 2013-10-24 DIAGNOSIS — G35 Multiple sclerosis: Secondary | ICD-10-CM

## 2013-10-24 MED ORDER — BACLOFEN 10 MG PO TABS: 10.0000 mg | ORAL_TABLET | Freq: Three times a day (TID) | ORAL | Status: DC

## 2013-10-24 MED ORDER — GABAPENTIN 300 MG PO CAPS: 600.0000 mg | ORAL_CAPSULE | Freq: Three times a day (TID) | ORAL | Status: DC

## 2013-10-24 NOTE — Progress Notes (Signed)
NEUROLOGY FOLLOW UP OFFICE NOTE  Bobby Stafford [id number]  HISTORY OF PRESENT ILLNESS: Bobby Stafford is a 38 year old left-handed man who follows up for newly-diagnosed multiple sclerosis.    UPDATE: Tecfidera initiated.  He has been under a lot of stress.  He continues to have pain, involving the neck, cramping in the hands and the right leg and knee.  Percocet was tapered off and he is taking Tylenol 3.  He also takes tramadol.  He separated from his girlfriend.  He was recently seen in the ED for heat exhaustion.  Due to the pain in his legs, he still ambulates with a cane.  His right hand hurts.  Notes the pain in the right thigh, associated with allodynia.  Other medications:  Baclofen 71m three times daily as needed for muscle spasms; gabapentin 3066mthree times daily for leg pain.  Tramadol and Tylenol 3.  Due to right knee pain and tenderness, X-ray was performed, which was unremarkable.  09/11/13 LABS:  WBC 6.8, HGB 15.7, HCT 43.2, PLT 247, relative lymphs 29, Na 138, K 4.2, glucose 90, BUN 19, Cr 1.09, TB 1.1, Alk Phos 55, AST 19, ALT 37 08/16/13 LABS:  B12 438, ANCA negative, vit D 25-hydroxy 15, RF negative, ANA negative, SSA/SSB antibodies negative  HISTORY: [facility] with four days of weakness and numbness of the right arm and leg.  At one point, his knee buckled and he fell.  There was a concern for stroke.  CT of the head revealed cerebral volume loss and white matter changes.  MRI of the brain, cervical and thoracic spine with and without contrast revealed numerous supra and infratentiorial white matter lesions, also involving the cervical spine and with enhancement.  He was given Solumedrol 100071maily for 3 days, followed by prednisone taper at discharge.  Since discharge, symptoms included trouble walking, mostly due to the pain in his leg.  He describes a sharp aching pain in his anterior right thigh radiating to the knee, with knee pain and  burning sensation from the knee down the leg and cramping in the calf.  He reports episode of vertigo in 2013.  It seemed like BPPV as it was positional and only lasted a minute or two.  It spontaneously resolved.  He also has a history of chronic back pain.  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  No other prior episodes of unilateral weakness or numbness similar to recent events.  There is no family history of MS.  PAST MEDICAL HISTORY: Past Medical History  Diagnosis Date  . MS (multiple sclerosis)   . Arthritis     MEDICATIONS: Current Outpatient Prescriptions on File Prior to Visit  Medication Sig Dispense Refill  . acetaminophen-codeine (TYLENOL #3) 300-30 MG per tablet Take 1 tablet by mouth every 6 (six) hours as needed for moderate pain.  60 tablet  0  . cholecalciferol (VITAMIN D) 1000 UNITS tablet Take 4,000 Units by mouth daily.      . Dimethyl Fumarate (TECFIDERA) 240 MG CPDR Take 240 mg by mouth every morning.       . LMarland KitchenRazepam (ATIVAN) 1 MG tablet Take 1 tablet (1 mg total) by mouth every 8 (eight) hours as needed (spasms).  90 tablet  0  . naproxen sodium (ANAPROX) 220 MG tablet Take 220 mg by mouth 2 (two) times daily as  needed (pain).       . tadalafil (CIALIS) 20 MG tablet Take 0.5 tablets (10 mg total) by mouth every other day as needed for erectile dysfunction.  90 tablet  3  . traMADol (ULTRAM) 50 MG tablet Take 1 tablet (50 mg total) by mouth every 8 (eight) hours as needed.  60 tablet  0   No current facility-administered medications on file prior to visit.    ALLERGIES: No Known Allergies  FAMILY HISTORY: Family History  Problem Relation Age of Onset  . Diabetes type I Father     SOCIAL HISTORY: History   Social History  . Marital Status: Single    Spouse Name: N/A    Number of Children: N/A  . Years of Education: N/A   Occupational History  . Not on  file.   Social History Main Topics  . Smoking status: Current Every Day Smoker -- 0.75 packs/day for 24 years    Types: Cigarettes  . Smokeless tobacco: Never Used  . Alcohol Use: No  . Drug Use: 7.00 per week    Special: Marijuana  . Sexual Activity: Yes   Other Topics Concern  . Not on file   Social History Narrative  . No narrative on file    REVIEW OF SYSTEMS: Constitutional: No fevers, chills, or sweats, no generalized fatigue, change in appetite Eyes: No visual changes, double vision, eye pain Ear, nose and throat: No hearing loss, ear pain, nasal congestion, sore throat Cardiovascular: No chest pain, palpitations Respiratory:  No shortness of breath at rest or with exertion, wheezes GastrointestinaI: No nausea, vomiting, diarrhea, abdominal pain, fecal incontinence Genitourinary:  No dysuria, urinary retention or frequency Musculoskeletal:  No neck pain, back pain Integumentary: No rash, pruritus, skin lesions Neurological: as above Psychiatric: No depression, insomnia, anxiety Endocrine: No palpitations, fatigue, diaphoresis, mood swings, change in appetite, change in weight, increased thirst Hematologic/Lymphatic:  No anemia, purpura, petechiae. Allergic/Immunologic: no itchy/runny eyes, nasal congestion, recent allergic reactions, rashes  PHYSICAL EXAM: Filed Vitals:   10/24/13 0950  BP: 138/78  Pulse: 82  Temp: 98.4 F (36.9 C)  Resp: 20   General: No acute distress Head:  Normocephalic/atraumatic Neck: supple, no paraspinal tenderness, full range of motion Heart:  Regular rate and rhythm Lungs:  Clear to auscultation bilaterally Back: No paraspinal tenderness Neurological Exam: alert and oriented to person, place, and time. Attention span and concentration intact, recent and remote memory intact, fund of knowledge intact.  Speech fluent and not dysarthric, language intact.  CN II-XII intact. Fundoscopic exam unremarkable without vessel changes, exudates,  hemorrhages or papilledema.  Bulk and tone normal, muscle strength 5/5 throughout.  Pinprick intact.  Reduced vibratory sensation in the right upper and lower extremities.  Deep tendon reflexes 2+ throughout, toes downgoing.  Right upper extremity dysmetria.  Spastic looking antalgic gait.  IMPRESSION: Probable relapsing-remitting MS  PLAN: 1.  Tecfidera 2.  Increase gabapentin to 623m three times daily and baclofen 116mup to three times daily 3.  PT/OT 4.  D3 4000 IU daily 5.  Repeat MRI of brain and cervical spine with and without contrast, as well as CBC with diff in 3 months with follow up soon after.    AdMetta ClinesDO  CC:  OlAngelica ChessmanMD

## 2013-10-24 NOTE — Patient Instructions (Signed)
1.  Continue Tecfidera 2.  Increase gabapentin 300mg  capsules to 2 capsules three times a day. 3.  Baclofen 10mg  three times a day as needed. 4.  Physical therapy 5.  Repeat MRI of the brain and cervical spine in 3 months, as well as blood work in 3 months with follow up with me soon after.

## 2013-10-24 NOTE — Addendum Note (Signed)
Addended by: Glean Salen E on: 10/24/2013 11:16 AM   Modules accepted: Orders

## 2013-10-25 ENCOUNTER — Telehealth: Payer: Self-pay | Admitting: Neurology

## 2013-10-25 NOTE — Telephone Encounter (Signed)
[facility] needs someone to call them back at [phone] about a rx

## 2013-10-29 ENCOUNTER — Ambulatory Visit: Payer: PRIVATE HEALTH INSURANCE | Attending: Internal Medicine

## 2013-10-29 ENCOUNTER — Encounter: Payer: Self-pay | Admitting: Internal Medicine

## 2013-10-29 ENCOUNTER — Ambulatory Visit: Payer: Medicaid Other | Attending: Internal Medicine | Admitting: Internal Medicine

## 2013-10-29 VITALS — BP 115/77 | HR 72 | Temp 98.4°F | Resp 18 | Ht 76.0 in | Wt 321.0 lb

## 2013-10-29 DIAGNOSIS — F17200 Nicotine dependence, unspecified, uncomplicated: Secondary | ICD-10-CM | POA: Diagnosis not present

## 2013-10-29 DIAGNOSIS — M25519 Pain in unspecified shoulder: Secondary | ICD-10-CM

## 2013-10-29 DIAGNOSIS — M25512 Pain in left shoulder: Secondary | ICD-10-CM

## 2013-10-29 DIAGNOSIS — G35 Multiple sclerosis: Secondary | ICD-10-CM

## 2013-10-29 MED ORDER — DICLOFENAC EPOLAMINE 1.3 % TD PTCH: 1.0000 | MEDICATED_PATCH | Freq: Two times a day (BID) | TRANSDERMAL | Status: DC

## 2013-10-29 NOTE — Progress Notes (Signed)
Pt here to f/u HFU- heat exhaustion diagnosed in ER 10/16/13 with depression. Pt given IV fluids with blood work Instructed to f/u with PCP Pt states he is feeling much better physically Diagnosed with MD 06/2013 taking prescribed Tecfidera 240 mg daily C/o neck pain radiating across shoulder. Pt cane assist for ambulation Pt is following Neurology for MS

## 2013-11-03 NOTE — Progress Notes (Signed)
Patient ID: Bobby Stafford, male   DOB: 12-Dec-1975, 38 y.o.   MRN: [id number]  CC: Hospital followup  HPI: Patient reports that he was seen in the emergency department for dehydration. Patient reports that since discharge symptoms have resolved and he is doing well. Patient's current complaint is generalized pain.  Patient reports his most severe pain is noticed in his left shoulder.  Patient reports that he often becomes very angry because he is unable to achieve pain relief. He reports that the emergency department will no longer give him pain medication or the urgent care. Patient complains that neurologists would not give him oxycodone for his pain. He said the voices several concerns about life situation and current psychological state. Patient reports that he has not established care with a psychiatrist at this time, but plans to in the future.  No Known Allergies Past Medical History  Diagnosis Date  . MS (multiple sclerosis)   . Arthritis    Current Outpatient Prescriptions on File Prior to Visit  Medication Sig Dispense Refill  . baclofen (LIORESAL) 10 MG tablet Take 1 tablet (10 mg total) by mouth 3 (three) times daily.  90 each  3  . cholecalciferol (VITAMIN D) 1000 UNITS tablet Take 4,000 Units by mouth daily.      . Dimethyl Fumarate (TECFIDERA) 240 MG CPDR Take 240 mg by mouth every morning.       . gabapentin (NEURONTIN) 300 MG capsule Take 2 capsules (600 mg total) by mouth 3 (three) times daily.  180 capsule  3  . traMADol (ULTRAM) 50 MG tablet Take 1 tablet (50 mg total) by mouth every 8 (eight) hours as needed.  60 tablet  0  . acetaminophen-codeine (TYLENOL #3) 300-30 MG per tablet Take 1 tablet by mouth every 6 (six) hours as needed for moderate pain.  60 tablet  0  . LORazepam (ATIVAN) 1 MG tablet Take 1 tablet (1 mg total) by mouth every 8 (eight) hours as needed (spasms).  90 tablet  0  . naproxen sodium (ANAPROX) 220 MG tablet Take 220 mg by mouth 2 (two) times daily as  needed (pain).       . tadalafil (CIALIS) 20 MG tablet Take 0.5 tablets (10 mg total) by mouth every other day as needed for erectile dysfunction.  90 tablet  3   No current facility-administered medications on file prior to visit.   Family History  Problem Relation Age of Onset  . Diabetes type I Father    History   Social History  . Marital Status: Single    Spouse Name: N/A    Number of Children: N/A  . Years of Education: N/A   Occupational History  . Not on file.   Social History Main Topics  . Smoking status: Current Every Day Smoker -- 0.75 packs/day for 24 years    Types: Cigarettes  . Smokeless tobacco: Never Used  . Alcohol Use: No  . Drug Use: 7.00 per week    Special: Marijuana  . Sexual Activity: Yes   Other Topics Concern  . Not on file   Social History Narrative  . No narrative on file    Review of Systems: See HPI   Objective:   Filed Vitals:   10/29/13 1715  BP: 115/77  Pulse: 72  Temp: 98.4 F (36.9 C)  Resp: 18   Physical Exam  Constitutional: He is oriented to person, place, and time.  Neck: Normal range of motion. Neck supple.  Cardiovascular:  Normal rate, regular rhythm and normal heart sounds.   Pulmonary/Chest: Effort normal and breath sounds normal.  Abdominal: Soft.  Musculoskeletal: Normal range of motion.  Uses cane to ambulate   Neurological: He is alert and oriented to person, place, and time.  Skin: Skin is warm and dry.  Psychiatric:  Tearful, depressed state Patient voices several times that he feels worthless and feels that people do not accept his condition In unhealthy relationship with girlfriend     Lab Results  Component Value Date   WBC 9.9 10/16/2013   HGB 14.2 10/16/2013   HCT 41.0 10/16/2013   MCV 93.8 10/16/2013   PLT 239 10/16/2013   Lab Results  Component Value Date   CREATININE 1.14 10/16/2013   BUN 14 10/16/2013   NA 144 10/16/2013   K 3.8 10/16/2013   CL 106 10/16/2013   CO2 21 10/16/2013     Lab Results  Component Value Date   HGBA1C 4.9 10/10/2013   Lipid Panel  No results found for this basename: chol, trig, hdl, cholhdl, vldl, ldlcalc       Assessment and plan:   Bobby Stafford was seen today for hospitalization follow-up and medication refill.  Diagnoses and associated orders for this visit:  Pain in joint, shoulder region, left - diclofenac (FLECTOR) 1.3 % PTCH; Place 1 patch onto the skin 2 (two) times daily. Patient reports that he is unable to pay for tramadol that was given to him on last office visit. Patient refuses Toradol injection at this time.   MS (multiple sclerosis) Continue current therapy from neurologist.  Stressed to patient the need to establish care with psychiatry. Explained that many of his symptoms may be related to his mental health.   Total time counseling patient on behavioral health concerns: 25 minutes.  Keep her in followup schedule.       Holland CommonsKECK, Bobby Tatum, NP-C [facility] [phone] 11/03/2013, 11:17 PM

## 2013-11-11 ENCOUNTER — Telehealth: Payer: Self-pay | Admitting: Neurology

## 2013-11-11 NOTE — Telephone Encounter (Signed)
Patient called about rx I informed him [facility]

## 2013-11-11 NOTE — Telephone Encounter (Signed)
Needs to talk to someone about medication  please call [phone]

## 2013-11-18 ENCOUNTER — Ambulatory Visit: Payer: Medicaid Other | Admitting: Physical Therapy

## 2013-11-18 ENCOUNTER — Ambulatory Visit: Payer: Medicaid Other | Attending: Internal Medicine | Admitting: Occupational Therapy

## 2013-11-18 DIAGNOSIS — M6281 Muscle weakness (generalized): Secondary | ICD-10-CM | POA: Diagnosis not present

## 2013-11-18 DIAGNOSIS — Z5189 Encounter for other specified aftercare: Secondary | ICD-10-CM | POA: Diagnosis present

## 2013-11-18 DIAGNOSIS — G35 Multiple sclerosis: Secondary | ICD-10-CM | POA: Diagnosis not present

## 2013-11-18 DIAGNOSIS — R269 Unspecified abnormalities of gait and mobility: Secondary | ICD-10-CM | POA: Insufficient documentation

## 2013-11-18 DIAGNOSIS — R279 Unspecified lack of coordination: Secondary | ICD-10-CM | POA: Diagnosis not present

## 2013-11-18 DIAGNOSIS — R5381 Other malaise: Secondary | ICD-10-CM | POA: Diagnosis not present

## 2013-11-25 ENCOUNTER — Ambulatory Visit: Payer: Medicaid Other | Admitting: Physical Therapy

## 2013-11-25 ENCOUNTER — Ambulatory Visit: Payer: Medicaid Other | Admitting: Occupational Therapy

## 2013-11-25 DIAGNOSIS — Z5189 Encounter for other specified aftercare: Secondary | ICD-10-CM | POA: Diagnosis not present

## 2013-11-28 ENCOUNTER — Ambulatory Visit: Payer: Medicaid Other | Admitting: Physical Therapy

## 2013-11-28 ENCOUNTER — Ambulatory Visit: Payer: Medicaid Other | Admitting: Occupational Therapy

## 2013-11-28 DIAGNOSIS — Z5189 Encounter for other specified aftercare: Secondary | ICD-10-CM | POA: Diagnosis not present

## 2013-12-03 ENCOUNTER — Ambulatory Visit: Payer: Medicaid Other | Admitting: Occupational Therapy

## 2013-12-03 ENCOUNTER — Ambulatory Visit: Payer: Medicaid Other | Attending: Internal Medicine | Admitting: Physical Therapy

## 2013-12-03 DIAGNOSIS — R279 Unspecified lack of coordination: Secondary | ICD-10-CM | POA: Diagnosis not present

## 2013-12-03 DIAGNOSIS — R269 Unspecified abnormalities of gait and mobility: Secondary | ICD-10-CM | POA: Insufficient documentation

## 2013-12-03 DIAGNOSIS — R5381 Other malaise: Secondary | ICD-10-CM | POA: Insufficient documentation

## 2013-12-03 DIAGNOSIS — Z5189 Encounter for other specified aftercare: Secondary | ICD-10-CM | POA: Diagnosis not present

## 2013-12-03 DIAGNOSIS — G35 Multiple sclerosis: Secondary | ICD-10-CM | POA: Diagnosis not present

## 2013-12-03 DIAGNOSIS — M6281 Muscle weakness (generalized): Secondary | ICD-10-CM | POA: Diagnosis not present

## 2013-12-04 ENCOUNTER — Ambulatory Visit: Payer: Medicaid Other | Admitting: Occupational Therapy

## 2013-12-04 ENCOUNTER — Ambulatory Visit: Payer: Medicaid Other | Admitting: Physical Therapy

## 2013-12-04 DIAGNOSIS — Z5189 Encounter for other specified aftercare: Secondary | ICD-10-CM | POA: Diagnosis not present

## 2013-12-05 ENCOUNTER — Telehealth: Payer: Self-pay | Admitting: Neurology

## 2013-12-05 NOTE — Telephone Encounter (Signed)
Patient called to ask about a new medication Ampyra for MS I advised him to discuss this at his next visit in Sept with Dr Everlena Cooper .He also ask for a refill his gabapentin  He thought he was supposed to be taking it 5 times a day per the last office note it is 600mg  tid  I clarified this with the patient

## 2013-12-05 NOTE — Telephone Encounter (Signed)
Pt called requesting to speak to a nurse regarding a medication he would like to try for his MS. C/B [phone]

## 2013-12-06 ENCOUNTER — Encounter (HOSPITAL_COMMUNITY): Payer: PRIVATE HEALTH INSURANCE | Admitting: Psychiatry

## 2013-12-09 NOTE — Progress Notes (Signed)
This encounter was created in error - please disregard.

## 2013-12-10 ENCOUNTER — Ambulatory Visit: Payer: Medicaid Other | Admitting: Occupational Therapy

## 2013-12-10 ENCOUNTER — Ambulatory Visit: Payer: Medicaid Other | Admitting: Physical Therapy

## 2013-12-10 DIAGNOSIS — Z5189 Encounter for other specified aftercare: Secondary | ICD-10-CM | POA: Diagnosis not present

## 2013-12-11 ENCOUNTER — Other Ambulatory Visit: Payer: Self-pay | Admitting: Internal Medicine

## 2013-12-12 ENCOUNTER — Ambulatory Visit: Payer: Medicaid Other | Admitting: Physical Therapy

## 2013-12-12 ENCOUNTER — Ambulatory Visit: Payer: Medicaid Other | Admitting: Occupational Therapy

## 2013-12-12 ENCOUNTER — Ambulatory Visit: Payer: PRIVATE HEALTH INSURANCE | Admitting: Internal Medicine

## 2013-12-12 DIAGNOSIS — Z5189 Encounter for other specified aftercare: Secondary | ICD-10-CM | POA: Diagnosis not present

## 2013-12-16 ENCOUNTER — Ambulatory Visit: Payer: Medicaid Other | Admitting: Occupational Therapy

## 2013-12-16 ENCOUNTER — Ambulatory Visit: Payer: Medicaid Other | Admitting: Physical Therapy

## 2013-12-16 DIAGNOSIS — Z5189 Encounter for other specified aftercare: Secondary | ICD-10-CM | POA: Diagnosis not present

## 2013-12-18 ENCOUNTER — Ambulatory Visit: Payer: Medicaid Other | Admitting: Physical Therapy

## 2013-12-18 ENCOUNTER — Ambulatory Visit: Payer: Medicaid Other | Admitting: Occupational Therapy

## 2013-12-18 DIAGNOSIS — Z5189 Encounter for other specified aftercare: Secondary | ICD-10-CM | POA: Diagnosis not present

## 2013-12-19 ENCOUNTER — Encounter: Payer: Self-pay | Admitting: Internal Medicine

## 2013-12-19 ENCOUNTER — Ambulatory Visit: Payer: Medicaid Other | Attending: Internal Medicine | Admitting: Internal Medicine

## 2013-12-19 VITALS — BP 123/75 | HR 68 | Temp 98.0°F | Resp 16 | Wt 310.0 lb

## 2013-12-19 DIAGNOSIS — F17200 Nicotine dependence, unspecified, uncomplicated: Secondary | ICD-10-CM | POA: Diagnosis not present

## 2013-12-19 DIAGNOSIS — R52 Pain, unspecified: Secondary | ICD-10-CM | POA: Insufficient documentation

## 2013-12-19 DIAGNOSIS — G35 Multiple sclerosis: Secondary | ICD-10-CM | POA: Diagnosis not present

## 2013-12-19 DIAGNOSIS — Z791 Long term (current) use of non-steroidal anti-inflammatories (NSAID): Secondary | ICD-10-CM | POA: Insufficient documentation

## 2013-12-19 MED ORDER — TRAMADOL HCL 50 MG PO TABS: 50.0000 mg | ORAL_TABLET | Freq: Three times a day (TID) | ORAL | Status: DC | PRN

## 2013-12-19 NOTE — Progress Notes (Signed)
Patient ID: Bobby Stafford, male   DOB: 06/04/1975, 38 y.o.   MRN: [id number]  CC: generalized pain  HPI:  Patient reports that he is continuing to have wide spread pain.  He states that he believes his pain is exacerbated by physical therapy he has been participating in.  He reports that widespread pain as tingling.  He reports that he is continuing to have headaches that are affecting his lifestyle and sleep. He is very concerned about trying different medications for his MS. He is bothered by not working and not having great mobility.  He feels as if the Tecfidera is not working as great now due to the complications he is having with physical therapy.    No Known Allergies Past Medical History  Diagnosis Date  . MS (multiple sclerosis)   . Arthritis    Current Outpatient Prescriptions on File Prior to Visit  Medication Sig Dispense Refill  . baclofen (LIORESAL) 10 MG tablet Take 1 tablet (10 mg total) by mouth 3 (three) times daily.  90 each  3  . cholecalciferol (VITAMIN D) 1000 UNITS tablet Take 4,000 Units by mouth daily.      . Dimethyl Fumarate (TECFIDERA) 240 MG CPDR Take 240 mg by mouth every morning.       . gabapentin (NEURONTIN) 300 MG capsule Take 2 capsules (600 mg total) by mouth 3 (three) times daily.  180 capsule  3  . tadalafil (CIALIS) 20 MG tablet Take 0.5 tablets (10 mg total) by mouth every other day as needed for erectile dysfunction.  90 tablet  3  . traMADol (ULTRAM) 50 MG tablet TAKE 1 TABLET BY MOUTH EVERY 8 HOURS AS NEEDED  60 tablet  0  . acetaminophen-codeine (TYLENOL #3) 300-30 MG per tablet Take 1 tablet by mouth every 6 (six) hours as needed for moderate pain.  60 tablet  0  . diclofenac (FLECTOR) 1.3 % PTCH Place 1 patch onto the skin 2 (two) times daily.  3 patch  2  . LORazepam (ATIVAN) 1 MG tablet Take 1 tablet (1 mg total) by mouth every 8 (eight) hours as needed (spasms).  90 tablet  0  . naproxen sodium (ANAPROX) 220 MG tablet Take 220 mg by mouth 2 (two)  times daily as needed (pain).        No current facility-administered medications on file prior to visit.   Family History  Problem Relation Age of Onset  . Diabetes type I Father    History   Social History  . Marital Status: Single    Spouse Name: N/A    Number of Children: N/A  . Years of Education: N/A   Occupational History  . Not on file.   Social History Main Topics  . Smoking status: Current Every Day Smoker -- 0.75 packs/day for 24 years    Types: Cigarettes  . Smokeless tobacco: Never Used  . Alcohol Use: No  . Drug Use: 7.00 per week    Special: Marijuana  . Sexual Activity: Yes   Other Topics Concern  . Not on file   Social History Narrative  . No narrative on file   Review of Systems  Respiratory: Negative.   Cardiovascular: Negative.   Gastrointestinal: Negative.   Genitourinary: Negative.   Musculoskeletal: Positive for back pain and neck pain.       Arthralgias, stiffiness Decreased mobility   Neurological: Positive for dizziness, tingling and headaches. Negative for seizures.       Objective:  Filed Vitals:   12/19/13 1634  BP: 123/75  Pulse: 68  Temp: 98 F (36.7 C)  Resp: 16   Physical Exam  Cardiovascular: Normal rate, regular rhythm and normal heart sounds.   Pulmonary/Chest: Effort normal and breath sounds normal.  Abdominal: Soft. Bowel sounds are normal.  Musculoskeletal: He exhibits no edema and no tenderness.  Decreased ROM in BUE and BLE   .  Lab Results  Component Value Date   WBC 9.9 10/16/2013   HGB 14.2 10/16/2013   HCT 41.0 10/16/2013   MCV 93.8 10/16/2013   PLT 239 10/16/2013   Lab Results  Component Value Date   CREATININE 1.14 10/16/2013   BUN 14 10/16/2013   NA 144 10/16/2013   K 3.8 10/16/2013   CL 106 10/16/2013   CO2 21 10/16/2013    Lab Results  Component Value Date   HGBA1C 4.9 10/10/2013   Lipid Panel  No results found for this basename: chol, trig, hdl, cholhdl, vldl, ldlcalc       Assessment  and plan:   [name] was seen today for follow-up.  Diagnoses and associated orders for this visit:  Generalized pain - traMADol (ULTRAM) 50 MG tablet; Take 1 tablet (50 mg total) by mouth every 8 (eight) hours as needed.  MS (multiple sclerosis) Continue Tecfidera.    Return if symptoms worsen or fail to improve.        Holland Commons, NP-C [facility] [phone] 12/19/2013, 4:55 PM

## 2013-12-19 NOTE — Progress Notes (Signed)
Pt is here following up on his widespread pain. Pt is still having frequent headaches and has one right now.

## 2013-12-24 ENCOUNTER — Ambulatory Visit: Payer: Medicaid Other | Admitting: Occupational Therapy

## 2013-12-24 ENCOUNTER — Ambulatory Visit: Payer: Medicaid Other | Admitting: Physical Therapy

## 2013-12-24 DIAGNOSIS — Z5189 Encounter for other specified aftercare: Secondary | ICD-10-CM | POA: Diagnosis not present

## 2013-12-26 ENCOUNTER — Ambulatory Visit: Payer: PRIVATE HEALTH INSURANCE | Admitting: Physical Therapy

## 2013-12-26 ENCOUNTER — Encounter: Payer: PRIVATE HEALTH INSURANCE | Admitting: Occupational Therapy

## 2013-12-30 ENCOUNTER — Ambulatory Visit: Payer: Medicaid Other | Admitting: Occupational Therapy

## 2013-12-30 ENCOUNTER — Ambulatory Visit: Payer: Medicaid Other | Admitting: Physical Therapy

## 2013-12-30 DIAGNOSIS — Z5189 Encounter for other specified aftercare: Secondary | ICD-10-CM | POA: Diagnosis not present

## 2014-01-01 ENCOUNTER — Ambulatory Visit: Payer: Medicaid Other | Admitting: Occupational Therapy

## 2014-01-01 ENCOUNTER — Ambulatory Visit: Payer: Medicaid Other | Attending: Internal Medicine

## 2014-01-01 ENCOUNTER — Telehealth: Payer: Self-pay | Admitting: *Deleted

## 2014-01-01 DIAGNOSIS — M6281 Muscle weakness (generalized): Secondary | ICD-10-CM | POA: Diagnosis not present

## 2014-01-01 DIAGNOSIS — G35 Multiple sclerosis: Secondary | ICD-10-CM | POA: Diagnosis not present

## 2014-01-01 DIAGNOSIS — R279 Unspecified lack of coordination: Secondary | ICD-10-CM | POA: Diagnosis not present

## 2014-01-01 DIAGNOSIS — Z5189 Encounter for other specified aftercare: Secondary | ICD-10-CM | POA: Diagnosis not present

## 2014-01-01 DIAGNOSIS — R5381 Other malaise: Secondary | ICD-10-CM | POA: Diagnosis not present

## 2014-01-01 DIAGNOSIS — R269 Unspecified abnormalities of gait and mobility: Secondary | ICD-10-CM | POA: Diagnosis not present

## 2014-01-01 NOTE — Telephone Encounter (Signed)
Reminder of MRI's appt for Bobby Stafford long 01/05/14 11:45 am

## 2014-01-05 ENCOUNTER — Inpatient Hospital Stay (HOSPITAL_COMMUNITY): Admission: RE | Admit: 2014-01-05 | Payer: PRIVATE HEALTH INSURANCE | Source: Ambulatory Visit

## 2014-01-05 ENCOUNTER — Other Ambulatory Visit: Payer: Self-pay | Admitting: Neurology

## 2014-01-05 ENCOUNTER — Ambulatory Visit (HOSPITAL_COMMUNITY): Payer: PRIVATE HEALTH INSURANCE

## 2014-01-05 ENCOUNTER — Ambulatory Visit (HOSPITAL_COMMUNITY)
Admission: RE | Admit: 2014-01-05 | Discharge: 2014-01-05 | Disposition: A | Discharge status: Home / Self Care | Payer: PRIVATE HEALTH INSURANCE | Source: Ambulatory Visit | Attending: Neurology | Admitting: Neurology

## 2014-01-05 DIAGNOSIS — G35 Multiple sclerosis: Secondary | ICD-10-CM

## 2014-01-05 DIAGNOSIS — M47812 Spondylosis without myelopathy or radiculopathy, cervical region: Secondary | ICD-10-CM | POA: Insufficient documentation

## 2014-01-05 DIAGNOSIS — M5030 Other cervical disc degeneration, unspecified cervical region: Secondary | ICD-10-CM | POA: Insufficient documentation

## 2014-01-07 ENCOUNTER — Ambulatory Visit: Payer: Medicaid Other | Admitting: Physical Therapy

## 2014-01-07 ENCOUNTER — Ambulatory Visit: Payer: Medicaid Other | Admitting: Occupational Therapy

## 2014-01-07 DIAGNOSIS — Z5189 Encounter for other specified aftercare: Secondary | ICD-10-CM | POA: Diagnosis not present

## 2014-01-08 ENCOUNTER — Encounter: Payer: Self-pay | Admitting: Neurology

## 2014-01-08 ENCOUNTER — Ambulatory Visit (INDEPENDENT_AMBULATORY_CARE_PROVIDER_SITE_OTHER): Payer: Self-pay | Admitting: Neurology

## 2014-01-08 ENCOUNTER — Telehealth: Payer: Self-pay | Admitting: *Deleted

## 2014-01-08 VITALS — BP 138/76 | HR 78 | Temp 98.3°F | Resp 16 | Wt 307.2 lb

## 2014-01-08 DIAGNOSIS — G35 Multiple sclerosis: Secondary | ICD-10-CM

## 2014-01-08 LAB — CBC WITH DIFFERENTIAL/PLATELET
Basophils Absolute: 0 10*3/uL (ref 0.0–0.1)
Basophils Relative: 0 % (ref 0–1)
EOS ABS: 0.1 10*3/uL (ref 0.0–0.7)
Eosinophils Relative: 3 % (ref 0–5)
HCT: 41.8 % (ref 39.0–52.0)
Hemoglobin: 15.1 g/dL (ref 13.0–17.0)
Lymphocytes Relative: 24 % (ref 12–46)
Lymphs Abs: 1.1 10*3/uL (ref 0.7–4.0)
MCH: 32 pg (ref 26.0–34.0)
MCHC: 36.1 g/dL — ABNORMAL HIGH (ref 30.0–36.0)
MCV: 88.6 fL (ref 78.0–100.0)
MONOS PCT: 7 % (ref 3–12)
Monocytes Absolute: 0.3 10*3/uL (ref 0.1–1.0)
Neutro Abs: 3 10*3/uL (ref 1.7–7.7)
Neutrophils Relative %: 66 % (ref 43–77)
Platelets: 249 10*3/uL (ref 150–400)
RBC: 4.72 MIL/uL (ref 4.22–5.81)
RDW: 12.4 % (ref 11.5–15.5)
WBC: 4.5 10*3/uL (ref 4.0–10.5)

## 2014-01-08 MED ORDER — GABAPENTIN 300 MG PO CAPS: 900.0000 mg | ORAL_CAPSULE | Freq: Three times a day (TID) | ORAL | Status: DC

## 2014-01-08 MED ORDER — DIAZEPAM 5 MG PO TABS: ORAL_TABLET | ORAL | Status: DC

## 2014-01-08 NOTE — Patient Instructions (Addendum)
1.  Continue the Tecfidera 2.  We will get MRI of the brain and cervical spine with contrast asap.  Take a Valium 20-30 minutes prior to the MRI.  You need somebody with you to drive you home. 01/16/14 [facility] at 7:45 pm  3.  Increase the gabapentin to 3 pills three times a day 4.  Continue baclofen as needed 5.  Referral to pain specialist. 6. We will try to get you in for a much sooner eye appointment 7.  Follow up in 3 months. 8.  Have notes from [facility] MS center sent to me.

## 2014-01-08 NOTE — Telephone Encounter (Signed)
Left message for patient mother to call for Eye appt with Dr Antonietta Jewel on 01/15/14 8:30am  it will be a 170 up front payment. [address] [place], [place] [address]  [phone]

## 2014-01-08 NOTE — Progress Notes (Signed)
NEUROLOGY FOLLOW UP OFFICE NOTE  Bobby Stafford [id number]  HISTORY OF PRESENT ILLNESS: Bobby Stafford is a 38 year old left-handed man who follows up for multiple sclerosis.  He is accompanied by his mother.  UPDATE: Tecfidera initiated.  He reports since initial diagnosis that he has been suffering from diffuse pain, including muscle spasms in the back, neck and all extremities, as well as burning in his right thigh.  The pain causes his knees to buckle and he continues to use a cane.  He notes some difficulty with fine-finger movements in the right hand.  He has gone to OT and PT, and has been told he has been making progress.  Also since diagnosis, he reports occasional flickering lights in his vision.  He reports that he sees a film over his vision in his right eye and that he has pain in his left eye.  He has non-throbbing bi-frontal headache, a dull aching.  He has an eye appointment later this month.  He continues to have a great deal of stress and anxiety.  He has an upcoming appointment with behavioral health.  He reports occasional tremors.  He denies any new episodes of acute right sided numbness or weakness.  He denies urinary or bowel incontinence.    He had MRI of the brain and cervical spine performed 01/07/14, although it was performed only without contrast because of claustrophobia and pain.  Overall, the lesions look improved, except there appears to be some mild progression of cord lesions at C1 and C2.  Other medications:  Baclofen  three times daily as needed for muscle spasms (which helps); gabapentin  three times daily for leg pain.  Tramadol doesn't really help.  HISTORY: He was admitted to [facility] on 07/03/13 with four days of weakness and numbness of the right arm and leg.  At one point, his knee buckled and he fell.  There was a concern for stroke.  CT of the head revealed cerebral volume loss and white matter changes.  MRI of the brain, cervical and thoracic spine  with and without contrast revealed numerous supra and infratentiorial white matter lesions, also involving the cervical spine and with enhancement.  He was given Solumedrol  daily for 3 days, followed by prednisone taper at discharge.  He has had chronic issues, mainly diffuse pain and headache.  He has been under a lot of stress.  He continues to have pain, involving the neck, cramping in the hands and the right leg and knee.  XR of the right knee performed on 08/16/13 was unremarkable.  Percocet was tapered off and he is taking Tylenol 3.  He also takes tramadol.  He separated from his girlfriend.  He was recently seen in the ED for heat exhaustion.  Due to the pain in his legs, he still ambulates with a cane.  His right hand hurts.  Notes the pain in the right thigh, associated with allodynia.  He reports episode of vertigo in 2013.  It seemed like BPPV as it was positional and only lasted a minute or two.  It spontaneously resolved.  He also has a history of chronic back pain.  He works as a Corporate investment banker.  He has some vision problems, but no episodes of transient vision loss suspicious of optic neuritis.  No other prior episodes of unilateral weakness or numbness similar to recent events.  There is no family history of MS.  10/16/13:  WBC 9.9, HGB 14.2, HCT 41, PLT 239,  Na 144, K 3.8, glucose 92, BUN 14, Cr 1.14, TP 8.3, Alb 4.9, AST 31, ALT 69, PLP 55, TB 1.1. 08/16/13 LABS:  B12 438, ANCA negative, vit D 25-hydroxy 15, RF negative, ANA negative, SSA/SSB antibodies negative  PAST MEDICAL HISTORY: Past Medical History  Diagnosis Date  . MS (multiple sclerosis)   . Arthritis     MEDICATIONS: Current Outpatient Prescriptions on File Prior to Visit  Medication Sig Dispense Refill  . acetaminophen-codeine (TYLENOL #3) 300-30 MG per tablet Take 1 tablet by mouth every 6 (six) hours as needed for moderate pain.  60 tablet  0  . baclofen (LIORESAL) 10 MG tablet Take 1 tablet (10 mg total)  by mouth 3 (three) times daily.  90 each  3  . cholecalciferol (VITAMIN D) 1000 UNITS tablet Take 4,000 Units by mouth daily.      . diclofenac (FLECTOR) 1.3 % PTCH Place 1 patch onto the skin 2 (two) times daily.  3 patch  2  . Dimethyl Fumarate (TECFIDERA) 240 MG CPDR Take 240 mg by mouth every morning.       . naproxen sodium (ANAPROX) 220 MG tablet Take 220 mg by mouth 2 (two) times daily as needed (pain).       . tadalafil (CIALIS) 20 MG tablet Take 0.5 tablets (10 mg total) by mouth every other day as needed for erectile dysfunction.  90 tablet  3  . traMADol (ULTRAM) 50 MG tablet TAKE 1 TABLET BY MOUTH EVERY 8 HOURS AS NEEDED  60 tablet  0  . traMADol (ULTRAM) 50 MG tablet Take 1 tablet (50 mg total) by mouth every 8 (eight) hours as needed.  30 tablet  1  . LORazepam (ATIVAN) 1 MG tablet Take 1 tablet (1 mg total) by mouth every 8 (eight) hours as needed (spasms).  90 tablet  0   No current facility-administered medications on file prior to visit.    ALLERGIES: No Known Allergies  FAMILY HISTORY: Family History  Problem Relation Age of Onset  . Diabetes type I Father     SOCIAL HISTORY: History   Social History  . Marital Status: Single    Spouse Name: N/A    Number of Children: N/A  . Years of Education: N/A   Occupational History  . Not on file.   Social History Main Topics  . Smoking status: Current Every Day Smoker -- 0.75 packs/day for 24 years    Types: Cigarettes  . Smokeless tobacco: Never Used     Comment: patient is aware that smoking is not good for him   . Alcohol Use: No  . Drug Use: 7.00 per week    Special: Marijuana  . Sexual Activity: Yes    Partners: Female   Other Topics Concern  . Not on file   Social History Narrative  . No narrative on file    REVIEW OF SYSTEMS: Constitutional: No fevers, chills, or sweats, no generalized fatigue, change in appetite Eyes: Eye pain, Some dimming of vision Ear, nose and throat: No hearing loss, ear  pain, nasal congestion, sore throat Cardiovascular: No chest pain, palpitations Respiratory:  No shortness of breath at rest or with exertion, wheezes GastrointestinaI: No nausea, vomiting, diarrhea, abdominal pain, fecal incontinence Genitourinary:  No dysuria, urinary retention or frequency Musculoskeletal:  Diffuse pain Integumentary: No rash, pruritus, skin lesions Neurological: as above Psychiatric: No depression, insomnia, anxiety Endocrine: No palpitations, fatigue, diaphoresis, mood swings, change in appetite, change in weight, increased thirst Hematologic/Lymphatic:  No anemia, purpura, petechiae. Allergic/Immunologic: no itchy/runny eyes, nasal congestion, recent allergic reactions, rashes  PHYSICAL EXAM: Filed Vitals:   01/08/14 1120  BP: 138/76  Pulse: 78  Temp: 98.3 F (36.8 C)  Resp: 16   General: No acute distress Head:  Normocephalic/atraumatic Neck: supple, no paraspinal tenderness, full range of motion Heart:  Regular rate and rhythm Lungs:  Clear to auscultation bilaterally Back: No paraspinal tenderness Neurological Exam: alert and oriented to person, place, and time. Attention span and concentration intact, recent and remote memory intact, fund of knowledge intact.  Speech fluent and not dysarthric, language intact.  CN II-XII intact. Fundi not visualized.  Bulk and tone normal, muscle strength 5/5 throughout.  Endorses reduced pinprick in the right upper and lower extremities.  Vibration intact.  Deep tendon reflexes 2+ throughout, toes downgoing.  Right upper extremity dysmetria.  Antalgic gait with short strides and bearing weight more on the left leg.  Romberg negative  IMPRESSION: MS  PLAN: 1.  Continue Tecfedera and vitamin D 2.  Check CBC with diff 3.  Increase gabapentin to  three times daily 4.  Continue baclofen  three times daily as needed. 5.  Refer to pain specialist 6.  Will try to get him a sooner eye exam to assess for ongoing optic  neuritis. 7.  We will get MRI of brain and cervical spine with contrast and provided a Valium.  Instructed that he needs somebody with him to drive him. 8.  He has made an appointment with the MS center at [facility] for second opinion. 9.  Follow up in 3 months.   Shon Millet, DO  CC:  Jeanann Lewandowsky, MD

## 2014-01-10 ENCOUNTER — Ambulatory Visit: Payer: Medicaid Other | Admitting: Occupational Therapy

## 2014-01-10 ENCOUNTER — Ambulatory Visit: Payer: Medicaid Other | Admitting: Physical Therapy

## 2014-01-10 DIAGNOSIS — Z5189 Encounter for other specified aftercare: Secondary | ICD-10-CM | POA: Diagnosis not present

## 2014-01-13 ENCOUNTER — Other Ambulatory Visit: Payer: Self-pay | Admitting: Internal Medicine

## 2014-01-14 ENCOUNTER — Other Ambulatory Visit: Payer: Self-pay | Admitting: *Deleted

## 2014-01-14 ENCOUNTER — Telehealth: Payer: Self-pay | Admitting: Neurology

## 2014-01-14 DIAGNOSIS — G35 Multiple sclerosis: Secondary | ICD-10-CM

## 2014-01-14 MED ORDER — DIAZEPAM 10 MG PO TABS: 10.0000 mg | ORAL_TABLET | Freq: Four times a day (QID) | ORAL | Status: DC | PRN

## 2014-01-14 NOTE — Telephone Encounter (Signed)
I have spoke with this patient's mother 3 times  His valium was called in on 01/08/14 I have spoke with Ree Kida at the pharmacy and Marily Memos valium is waiting on patient to pick up

## 2014-01-14 NOTE — Telephone Encounter (Signed)
Pt called f/u on the valium for his MRI on 01/16/14. He states that his pharmacy did not have it when he checked yesterday.  C/B [phone]

## 2014-01-15 ENCOUNTER — Telehealth: Payer: Self-pay | Admitting: *Deleted

## 2014-01-15 NOTE — Telephone Encounter (Signed)
from [facility] wellness center per Arna Medici Referral specalist . Arna Medici states patient was denied his pain management visit and they do no pay for the eye appt either. Any type of referral  We make does not need to to go through [facility]  Before appointment or for payment

## 2014-01-15 NOTE — Telephone Encounter (Signed)
Pt called requesting to speak to a nurse regarding a referral  C/B [phone]

## 2014-01-15 NOTE — Telephone Encounter (Signed)
Left message on mother voicemail that referral was put in for pain management and Dr Mariann Laster.Marland Kitchen

## 2014-01-16 ENCOUNTER — Ambulatory Visit (HOSPITAL_COMMUNITY)
Admission: RE | Admit: 2014-01-16 | Discharge: 2014-01-16 | Disposition: A | Discharge status: Home / Self Care | Payer: Medicaid Other | Source: Ambulatory Visit | Attending: Neurology | Admitting: Neurology

## 2014-01-16 DIAGNOSIS — G35 Multiple sclerosis: Secondary | ICD-10-CM

## 2014-01-16 MED ORDER — GADOBENATE DIMEGLUMINE 529 MG/ML IV SOLN
20.0000 mL | Freq: Once | INTRAVENOUS | Status: AC | PRN
  Administered 2014-01-16: 20 mL via INTRAVENOUS

## 2014-01-17 ENCOUNTER — Telehealth: Payer: Self-pay | Admitting: *Deleted

## 2014-01-17 NOTE — Telephone Encounter (Signed)
Patients mother  and patient are both aware of normal MRI c spine

## 2014-01-17 NOTE — Telephone Encounter (Signed)
Message copied by Fredirick Maudlin on Fri Jan 17, 2014  1:36 PM ------      Message from: JAFFE, ADAM R      Created: Fri Jan 17, 2014 10:58 AM       MRI shows no evidence of acute demyelinating disease      ----- Message -----         From: Rad Results In Interface         Sent: 01/17/2014  10:08 AM           To: Cira Servant, DO                   ------

## 2014-01-17 NOTE — Telephone Encounter (Signed)
Message copied by Fredirick Maudlin on Fri Jan 17, 2014  1:37 PM ------      Message from: JAFFE, ADAM R      Created: Fri Jan 17, 2014 10:58 AM       MRI shows no evidence of acute demyelinating disease      ----- Message -----         From: Rad Results In Interface         Sent: 01/17/2014  10:08 AM           To: Cira Servant, DO                   ------

## 2014-01-21 ENCOUNTER — Telehealth: Payer: Self-pay | Admitting: Internal Medicine

## 2014-01-21 ENCOUNTER — Encounter: Payer: Self-pay | Admitting: *Deleted

## 2014-01-21 NOTE — Telephone Encounter (Signed)
May refill with 30 tablets, no refills

## 2014-01-21 NOTE — Telephone Encounter (Signed)
Pt called regarding his meds. He would like to speak to a nurse.  C/B [phone]

## 2014-01-21 NOTE — Telephone Encounter (Signed)
Patient has called in today to request a refill for script traMADol (ULTRAM) 50 MG tablet; patient is in the lobby asking to meet with a nurse; Patient was given the nurse line phone number but he himself does not have an active phone on at this time; please call patient @ [phone] or [phone] for today;

## 2014-01-21 NOTE — Telephone Encounter (Signed)
Patient's meds were called in to [facility]

## 2014-01-21 NOTE — Telephone Encounter (Signed)
Pt requesting medication refill Tramadol. Please f/u 

## 2014-01-22 ENCOUNTER — Telehealth: Payer: Self-pay | Admitting: Emergency Medicine

## 2014-01-22 DIAGNOSIS — R52 Pain, unspecified: Secondary | ICD-10-CM

## 2014-01-22 MED ORDER — TRAMADOL HCL 50 MG PO TABS
50.0000 mg | ORAL_TABLET | Freq: Three times a day (TID) | ORAL | Status: DC | PRN
Start: 2014-01-22 — End: 2014-04-10

## 2014-01-22 NOTE — Telephone Encounter (Signed)
Pt informed he can pick medication Tramadol @ front desk

## 2014-01-23 ENCOUNTER — Other Ambulatory Visit: Payer: Self-pay | Admitting: Internal Medicine

## 2014-01-24 ENCOUNTER — Ambulatory Visit: Payer: PRIVATE HEALTH INSURANCE | Admitting: Neurology

## 2014-02-05 ENCOUNTER — Telehealth: Payer: Self-pay | Admitting: Neurology

## 2014-02-05 NOTE — Telephone Encounter (Signed)
Records request received by mail from the [employer]. Forwarded via [employer] to HIM at [facility] for processing / Sherri S.

## 2014-02-13 ENCOUNTER — Encounter: Payer: Self-pay | Admitting: *Deleted

## 2014-02-13 ENCOUNTER — Telehealth: Payer: Self-pay | Admitting: Neurology

## 2014-02-13 ENCOUNTER — Other Ambulatory Visit: Payer: Self-pay | Admitting: Internal Medicine

## 2014-02-13 NOTE — Telephone Encounter (Signed)
Please call pt about medication please call [phone]

## 2014-02-17 ENCOUNTER — Other Ambulatory Visit: Payer: Self-pay | Admitting: Internal Medicine

## 2014-02-28 ENCOUNTER — Telehealth: Payer: Self-pay | Admitting: Neurology

## 2014-02-28 NOTE — Telephone Encounter (Signed)
Received request by mail from [employer] requesting medical records. Request sent to HIM at [facility] via interoffice mail for processing / Sherri S.  °

## 2014-03-05 ENCOUNTER — Other Ambulatory Visit: Payer: Self-pay | Admitting: Internal Medicine

## 2014-03-05 NOTE — Telephone Encounter (Signed)
No more refills have patient to continue baclofen and gabapentin for pain

## 2014-03-05 NOTE — Telephone Encounter (Signed)
Pt requested Rx Refill Tramadol.

## 2014-03-06 ENCOUNTER — Telehealth: Payer: Self-pay | Admitting: Internal Medicine

## 2014-03-06 NOTE — Telephone Encounter (Signed)
Patient has come into the clinic today to request a refill for tramadol; patient was made aware to continue the baclefan & gabapentin medication however patient has some concerns because he is taking 2700mg  of gabapentin daily; patient is still in pain and lack of sleep as a result; please f.u with patient for consult about this request

## 2014-03-06 NOTE — Telephone Encounter (Signed)
Patient has come into the clinic today to request a medication refill for tramadol; patient has been made aware of instructions to continue taking the baclefin & gabapentin (NEURONTIN) 300 MG capsule for pain however patient is concerned because he is taking 2700mg  of gabapentin (NEURONTIN) 300 MG capsule daily; please consult with patient about this request

## 2014-03-14 ENCOUNTER — Other Ambulatory Visit: Payer: Self-pay | Admitting: Neurology

## 2014-04-07 ENCOUNTER — Telehealth: Payer: Self-pay | Admitting: Neurology

## 2014-04-07 NOTE — Telephone Encounter (Signed)
Pt canceled his f.u appt for 04/09/14. Pt stated that he is going to see another neurologist at [facility].

## 2014-04-09 ENCOUNTER — Ambulatory Visit: Payer: Self-pay | Admitting: Neurology

## 2014-04-10 ENCOUNTER — Ambulatory Visit: Payer: Medicaid Other | Attending: Internal Medicine | Admitting: Internal Medicine

## 2014-04-10 ENCOUNTER — Encounter: Payer: Self-pay | Admitting: Internal Medicine

## 2014-04-10 VITALS — BP 116/77 | HR 80 | Temp 98.5°F | Resp 18 | Ht 76.0 in | Wt 334.0 lb

## 2014-04-10 DIAGNOSIS — N529 Male erectile dysfunction, unspecified: Secondary | ICD-10-CM | POA: Insufficient documentation

## 2014-04-10 DIAGNOSIS — G35 Multiple sclerosis: Secondary | ICD-10-CM

## 2014-04-10 DIAGNOSIS — R52 Pain, unspecified: Secondary | ICD-10-CM | POA: Insufficient documentation

## 2014-04-10 MED ORDER — TADALAFIL 20 MG PO TABS: 10.0000 mg | ORAL_TABLET | ORAL | Status: DC | PRN

## 2014-04-10 MED ORDER — DIMETHYL FUMARATE 240 MG PO CPDR: 240.0000 mg | DELAYED_RELEASE_CAPSULE | Freq: Every morning | ORAL | Status: DC

## 2014-04-10 MED ORDER — TRAMADOL HCL 50 MG PO TABS: 50.0000 mg | ORAL_TABLET | Freq: Three times a day (TID) | ORAL | Status: DC | PRN

## 2014-04-10 MED ORDER — BACLOFEN 10 MG PO TABS: 10.0000 mg | ORAL_TABLET | Freq: Three times a day (TID) | ORAL | Status: DC

## 2014-04-10 NOTE — Progress Notes (Signed)
Pt here to f/u with pain management s/p MS Pt was scheduled to see Dr. Everlena Cooper yesterday for f/u but states he is very dissatisfied with care Pt prefers to follow Neurology doctor @ [facility] 10/10 constant,burning pain all over unrelieved by prescribed medications Pt is currently smoking- education given

## 2014-04-10 NOTE — Progress Notes (Signed)
Patient ID: Bobby Stafford, male   DOB: 02/14/1976, 38 y.o.   MRN: [id number]   Bobby Stafford, is a 38 y.o. male  [id number]  [id number]  DOB - 02/14/1976  Chief Complaint  Patient presents with  . Follow-up    pain evaluation         Subjective:   Bobby Stafford is a 38 y.o. male here today for a follow up visit. Patient has history of multiple sclerosis with associated generalized body pain including muscle spasm in the back, neck and all extremities as well as burning in his right thigh, and erectile dysfunction. Pt here to f/u for pain management. He continues to use cane for ambulation. Pt was scheduled to see Dr. Everlena CooperJaffe yesterday for f/u but states he is very dissatisfied with care. Pt prefers to follow Neurology doctor @ [facility]. Pain is rated at 10/10 constant, burning pain all over unrelieved by prescribed medications. Pt is currently smoking about 1 pack of cigarettes per day. Patient has No headache, No chest pain, No abdominal pain - No Nausea, No new weakness tingling or numbness, No Cough - SOB. He had MRI of the brain and cervical spine performed 01/07/14, although it was performed only without contrast because of claustrophobia and pain. 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  Problem  Impotence  Generalized Pain  Multiple Sclerosis    ALLERGIES: No Known Allergies  PAST MEDICAL HISTORY: Past Medical History  Diagnosis Date  . MS (multiple sclerosis)   . Arthritis     MEDICATIONS AT HOME: Prior to Admission medications   Medication Sig Start Date End Date Taking? Authorizing Provider  baclofen (LIORESAL) 10 MG tablet Take 1 tablet (10 mg total) by mouth 3 (three) times daily. 04/10/14  Yes Quentin Angstlugbemiga E Jonanthan Bolender, MD  cholecalciferol (VITAMIN D) 1000 UNITS tablet Take 4,000 Units by mouth daily. 08/26/13  Yes Historical Provider, MD  Dimethyl Fumarate (TECFIDERA) 240 MG  CPDR Take 1 capsule (240 mg total) by mouth every morning. 04/10/14  Yes Quentin Angstlugbemiga E Nakeem Murnane, MD  gabapentin (NEURONTIN) 300 MG capsule Take 3 capsules (900 mg total) by mouth 3 (three) times daily. 01/08/14  Yes Adam Gus Rankinobert Jaffe, DO  diclofenac (FLECTOR) 1.3 % PTCH Place 1 patch onto the skin 2 (two) times daily. Patient not taking: Reported on 04/10/2014 10/29/13   Ambrose FinlandValerie A Keck, NP  LORazepam (ATIVAN) 1 MG tablet Take 1 tablet (1 mg total) by mouth every 8 (eight) hours as needed (spasms). Patient not taking: Reported on 04/10/2014 07/09/13   Dorothea OgleIskra M Myers, MD  naproxen sodium (ANAPROX) 220 MG tablet Take 220 mg by mouth 2 (two) times daily as needed (pain).     Historical Provider, MD  tadalafil (CIALIS) 20 MG tablet Take 0.5 tablets (10 mg total) by mouth every other day as needed for erectile dysfunction. 04/10/14   Quentin Angstlugbemiga E Jaasiel Hollyfield, MD  traMADol (ULTRAM) 50 MG tablet Take 1 tablet (50 mg total) by mouth every 8 (eight) hours as needed. 04/10/14   Quentin Angstlugbemiga E Allysen Lazo, MD     Objective:   Filed Vitals:   04/10/14 1158  BP: 116/77  Pulse: 80  Temp: 98.5 F (36.9 C)  TempSrc: Oral  Resp: 18  Height: 6\' 4"  (1.93 m)  Weight: 334 lb (151.501 kg)  SpO2: 97%    Exam General appearance : Awake, alert, not in any distress. Speech Clear. Not toxic looking HEENT: Atraumatic  and Normocephalic, pupils equally reactive to light and accomodation Neck: supple, no JVD. No cervical lymphadenopathy.  Chest:Good air entry bilaterally, no added sounds  CVS: S1 S2 regular, no murmurs.  Abdomen: Bowel sounds present, Non tender and not distended with no gaurding, rigidity or rebound. Extremities: B/L Lower Ext shows no edema, both legs are warm to touch Neurology: Awake alert, and oriented X 3, CN II-XII intact, Non focal Skin:No Rash Wounds:N/A  Data Review Lab Results  Component Value Date   HGBA1C 4.9 10/10/2013     Assessment & Plan   1. Impotence  - tadalafil (CIALIS) 20 MG  tablet; Take 0.5 tablets (10 mg total) by mouth every other day as needed for erectile dysfunction.  Dispense: 90 tablet; Refill: 3  2. Generalized pain  - traMADol (ULTRAM) 50 MG tablet; Take 1 tablet (50 mg total) by mouth every 8 (eight) hours as needed.  Dispense: 90 tablet; Refill: 1 - Ambulatory referral to Pain Clinic  3. Multiple sclerosis  - baclofen (LIORESAL) 10 MG tablet; Take 1 tablet (10 mg total) by mouth 3 (three) times daily.  Dispense: 90 tablet; Refill: 3 - Dimethyl Fumarate (TECFIDERA) 240 MG CPDR; Take 1 capsule (240 mg total) by mouth every morning.  Dispense: 90 capsule; Refill: 3 - Ambulatory referral to Pain Clinic - Ambulatory referral to Neurology  Return in about 6 months (around 10/10/2014) for Follow up Pain and comorbidities, Multiple Sclerosis.  The patient was given clear instructions to go to ER or return to medical center if symptoms don't improve, worsen or new problems develop. The patient verbalized understanding. The patient was told to call to get lab results if they haven't heard anything in the next week.   This note has been created with Education officer, environmental. Any transcriptional errors are unintentional.    Jeanann Lewandowsky, MD, MHA, FACP, FAAP [facility] [place], [place] [phone]   04/10/2014, 12:44 PM

## 2014-05-14 ENCOUNTER — Other Ambulatory Visit: Payer: Self-pay | Admitting: Neurology

## 2014-06-18 ENCOUNTER — Telehealth: Payer: Self-pay | Admitting: Internal Medicine

## 2014-06-18 ENCOUNTER — Other Ambulatory Visit: Payer: Self-pay | Admitting: *Deleted

## 2014-06-18 ENCOUNTER — Telehealth: Payer: Self-pay | Admitting: General Practice

## 2014-06-18 DIAGNOSIS — G35 Multiple sclerosis: Secondary | ICD-10-CM

## 2014-06-18 MED ORDER — GABAPENTIN 300 MG PO CAPS: ORAL_CAPSULE | ORAL | Status: DC

## 2014-06-18 MED ORDER — BACLOFEN 10 MG PO TABS: 10.0000 mg | ORAL_TABLET | Freq: Three times a day (TID) | ORAL | Status: DC

## 2014-06-18 NOTE — Telephone Encounter (Signed)
Patients fiance called to request a med refill for:  gabapentin (NEURONTIN) 300 MG capsule baclofen (LIORESAL) 10 MG tablet   She states that they were recently prescribed by the patients multiple sclerosis specialist in [facility] but since that is not his primary care Medicaid is not covering the medication and needs the refills from his PCP. They also wanted to check on the status of the paperwork they dropped off for transportation and PCA  Services. Please f/u with pt.

## 2014-06-18 NOTE — Telephone Encounter (Signed)
Pt advised to call pharmacy for refill

## 2014-06-18 NOTE — Telephone Encounter (Signed)
Pt stated Rx need to be sign by PCP per insurance Rx send to [facility] pharmacy

## 2014-06-18 NOTE — Telephone Encounter (Signed)
Patient's girlfriend presents to clinic to check on the status of paperwork that was dropped off for patient over 2 weeks ago. Please assist.

## 2014-06-26 ENCOUNTER — Telehealth: Payer: Self-pay | Admitting: Emergency Medicine

## 2014-06-26 ENCOUNTER — Other Ambulatory Visit: Payer: Self-pay | Admitting: Internal Medicine

## 2014-06-26 NOTE — Telephone Encounter (Signed)
Patients girlfiend called in regards to paperwork that has been in th office over 14 days, I cannot locate any paperwork, or an initial tele-encounter for same. I told patient to bring the documents by the office today and I would expedite the process if possible.

## 2014-06-26 NOTE — Telephone Encounter (Signed)
I have had Dr, Hyman Hopes complete the two requested documents, patient instructed to pick up forms for "patient completion"

## 2014-07-21 ENCOUNTER — Telehealth: Payer: Self-pay | Admitting: General Practice

## 2014-07-21 NOTE — Telephone Encounter (Signed)
Patient's fiance', Tamikka, dropped off paperwork for patient for MS[employer] ([employer]) patient assistance program.. Tamikka presented to clinic requesting that the form be given to provider and completed today, if possible. Informed Tamikka of the process for paperwork completion. She states that the form is urgent and needs to be completed before 07/28/14.   Patient just walks in to clinic stating that he just saw Dr. Hyman Hopes and Dr. Hyman Hopes informed him that he will complete form today. Informed patient that I would go hand forms to Doctor. Please assist

## 2014-07-21 NOTE — Telephone Encounter (Signed)
Patient's fiance', Tamikka, dropped off paperwork for patient for MS[employer] ([employer]) patient assistance program.. Tamikka presented to clinic requesting that the form be given to provider and completed today, if possible. Informed Tamikka of the process for paperwork completion. She states that the form is urgent and needs to be completed before 07/28/14.   Patient just walks in to clinic stating that he just saw Dr. Jegede and Dr. Jegede informed him that he will complete form today. Informed patient that I would go hand forms to Doctor. Please assist 

## 2014-08-08 ENCOUNTER — Telehealth: Payer: Self-pay | Admitting: *Deleted

## 2014-08-08 NOTE — Telephone Encounter (Signed)
A male, I believe the patent's girlfriend called in very angry about a Baclofen Rx that was supposed to be sent to our pharmacy from the patient's neurologist Dr. Dewitt Hoes.  She become very hostile on the phone with me saying that I was being rude to her.  I explained I was just trying to help her.  I put her on hold and called our pharmacy and spoke with [facility] who confirmed Rx was indeed ready to be picked up.  The male call thanked me and hung up

## 2014-08-19 ENCOUNTER — Ambulatory Visit: Payer: Medicaid Other | Attending: Student

## 2014-08-19 DIAGNOSIS — R269 Unspecified abnormalities of gait and mobility: Secondary | ICD-10-CM

## 2014-08-19 DIAGNOSIS — R296 Repeated falls: Secondary | ICD-10-CM | POA: Insufficient documentation
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
## 2014-08-22 NOTE — Addendum Note (Signed)
Addended by: Sherren Kerns on: 08/22/2014 12:50 PM   Modules accepted: Orders

## 2014-08-22 NOTE — Addendum Note (Signed)
Addended by: Sherren Kerns on: 08/22/2014 12:40 PM   Modules accepted: Orders

## 2014-09-03 ENCOUNTER — Ambulatory Visit: Payer: Medicaid Other

## 2014-09-10 ENCOUNTER — Ambulatory Visit: Payer: Medicaid Other

## 2014-09-12 NOTE — Therapy (Signed)
[facility] [address] [place], [place], [address] Phone: [phone]   Fax:  [phone]  Patient Details  Name: Bobby Stafford MRN: [id number] Date of Birth: March 20, 1976 Referring Provider:  No ref. provider found  Encounter Date: 09/12/2014  PHYSICAL THERAPY DISCHARGE SUMMARY  Visits from Start of Care: 1  Current functional level related to goals / functional outcomes:      PT Short Term Goals - 08/19/14 2103    PT SHORT TERM GOAL #1   Title Pt will be independent in HEP to improve strength, balance, flexibility, and endurance. Target date: 09/16/14.   Baseline No HEP.   Status New   PT SHORT TERM GOAL #2   Title Perform BERG and write goals if appropriate. Target date: 09/16/14.   Baseline Pt can perform static standing with feet apart for 19 seconds prior to requiring seated rest break.   Status New   PT SHORT TERM GOAL #3   Title Pt will ambulate 300' over even terrain with LRAD with supervision to improve functional mobility. Target date: 09/16/14.   Baseline 100' with SBQC and min guard to min A to maintain balance.   Status New   PT SHORT TERM GOAL #4   Title Pt will improve gait speed to >/=1.35f/sec. with LRAD to reduce falls risk. Target date: 09/16/14.   Baseline 1.275fsec. with SBQC   Status New         PT Long Term Goals - 08/19/14 2107    PT LONG TERM GOAL #1   Title Pt will verbalize fall prevention strategies to reduce falls risk. Target date: 10/14/14.   Baseline Unable to verbalize any fall prevention strategies.   Status New   PT LONG TERM GOAL #2   Title Pt will ambulate 500' over even/uneven terrain with LRAD at MOD I level to improve functional mobility. Target date: 10/14/14.   Baseline 100' with SBQC with min guard to min A   Status New   PT LONG TERM GOAL #3   Title Pt will perform TUG with LRAD in </=13.5 seconds to reduce falls risk. Target date: 10/14/14.   Baseline 29.62 sec. with  SBQC   Status New   PT LONG TERM GOAL #4   Title Pt will report zero falls in the last 4 weeks to improve safety during functional mobility. Target date: 10/14/14.   Baseline 4 falls in the last six months.   Status New        Remaining deficits: Unknown, as pt did not return after initial eval. Medicaid denied PT treatments and pt unable to afford out of pocket costs.   Education / Equipment: PT frequency/duration  Plan: Patient agrees to discharge.  Patient goals were not met. Patient is being discharged due to financial reasons.  ?????       Joeanna Howdyshell L 09/12/2014, 1:57 PM JeGeoffry ParadisePT,DPT 09/12/2014 1:58 PM Phone: [phone]: [phone]one: [phone] Fax:  [phone]

## 2014-09-15 ENCOUNTER — Other Ambulatory Visit: Payer: Self-pay | Admitting: Internal Medicine

## 2014-09-17 ENCOUNTER — Ambulatory Visit: Payer: Medicaid Other

## 2014-10-03 ENCOUNTER — Telehealth: Payer: Self-pay

## 2014-10-03 NOTE — Telephone Encounter (Addendum)
PT called Tamikka per her request. Medicaid told her that insurance denied PT, as MS wasn't listed as a diagnosis. However, MS is listed as medical diagnosis and is in the clinical impression. I will send this note to Conan Bowens, to gather clarification.

## 2014-10-29 ENCOUNTER — Ambulatory Visit: Payer: Medicaid Other

## 2014-10-29 ENCOUNTER — Ambulatory Visit: Payer: Self-pay

## 2014-11-06 ENCOUNTER — Ambulatory Visit: Payer: Medicaid Other | Attending: Student

## 2014-11-06 ENCOUNTER — Ambulatory Visit: Payer: Medicaid Other

## 2014-11-06 DIAGNOSIS — R269 Unspecified abnormalities of gait and mobility: Secondary | ICD-10-CM

## 2014-11-06 DIAGNOSIS — R2681 Unsteadiness on feet: Secondary | ICD-10-CM

## 2014-11-06 NOTE — Therapy (Signed)
[facility] [address] [place], [place], [address] Phone: [phone]   Fax:  [phone]  Physical Therapy Treatment  Patient Details  Name: Bobby Stafford MRN: [id number] Date of Birth: 02/03/1976 Referring Provider:  Quentin Angst, MD  Encounter Date: 11/06/2014      PT End of Session - 11/06/14 1422    Visit Number 2   Number of Visits 4  PT requested 17 visits but only 4 are covered   Date for PT Re-Evaluation 12/06/14   Authorization Type Medicaid auth (10/10/14-01/09/15)   Authorization - Visit Number 2   Authorization - Number of Visits 4   PT Start Time 1327   PT Stop Time 1411   PT Time Calculation (min) 44 min   Equipment Utilized During Treatment --  min A to min guard, due to increased body habitus (gait belt is uncomfortable for pt)   Activity Tolerance Patient limited by fatigue   Behavior During Therapy [facility] for tasks assessed/performed      Past Medical History  Diagnosis Date  . MS (multiple sclerosis)   . Arthritis     Past Surgical History  Procedure Laterality Date  . No surgerys      There were no vitals filed for this visit.  Visit Diagnosis:  Abnormality of gait - Plan: PT plan of care cert/re-cert  Unsteadiness - Plan: PT plan of care cert/re-cert      Subjective Assessment - 11/06/14 1337    Subjective Pt reports he is still experiencing R sided weakness, R sided N/T, impaired balance, waves of blurry vision/double vision. Pt reported 12 falls in the last year. [facility] came to pt's house and should be receiving help five days a week.  Pt's wife, Bobby Stafford, might be leaving pt.   Pertinent History MS, arthritis   Patient Stated Goals strengthening legs, joining fitness center after PT, and walk better   Currently in Pain? Yes   Pain Score 3    Pain Location Leg   Pain Orientation Right   Pain Descriptors / Indicators Aching   Pain Type Chronic pain   Pain Onset  More than a month ago   Pain Frequency Constant   Aggravating Factors  not taking medication and moving too much   Pain Relieving Factors medication                         [facility] Adult PT Treatment/Exercise - 11/06/14 1343    Ambulation/Gait   Ambulation/Gait Yes   Ambulation/Gait Assistance 5: Supervision;4: Min guard   Ambulation/Gait Assistance Details Min guard required during turns,otherwise, supervision to ensure safety. Pt required cues to keep feet within RW, improve upright posture, and stride length.   Ambulation Distance (Feet) --  70', 50'x2   Assistive device Rolling walker   Gait Pattern Decreased stride length;Decreased stance time - right;Decreased dorsiflexion - right;Lateral hip instability;Right flexed knee in stance;Step-through pattern   Ambulation Surface Level;Indoor   Gait velocity 1.90ft/sec.  with RW   Standardized Balance Assessment   Standardized Balance Assessment Berg Balance Test   Berg Balance Test   Sit to Stand Able to stand without using hands and stabilize independently   Standing Unsupported Able to stand 30 seconds unsupported  40 seconds   Sitting with Back Unsupported but Feet Supported on Floor or Stool Able to sit safely and securely 2 minutes   Stand to Sit Sits safely with minimal use of hands  Transfers Able to transfer safely, minor use of hands   Standing Unsupported with Eyes Closed Able to stand 3 seconds   Standing Ubsupported with Feet Together Able to place feet together independently and stand for 1 minute with supervision   From Standing, Reach Forward with Outstretched Arm Can reach forward >12 cm safely (5")  8"   From Standing Position, Pick up Object from Floor Able to pick up shoe, needs supervision   From Standing Position, Turn to Look Behind Over each Shoulder Looks behind one side only/other side shows less weight shift   Turn 360 Degrees Needs assistance while turning   Standing Unsupported,  Alternately Place Feet on Step/Stool Able to complete >2 steps/needs minimal assist   Standing Unsupported, One Foot in Front Able to take small step independently and hold 30 seconds   Standing on One Leg Tries to lift leg/unable to hold 3 seconds but remains standing independently  L SLS: 3.5seconds R SLS: 1 second    Total Score 36                PT Education - 11/06/14 1421    Education provided Yes   Education Details PT explained BERG and gait speed findings and Medicaid limit of 1 eval and 3 treats per year, unless recent hospitalization. PT reiterated the importance of energy conservation and staying cool during summer months, as heat can increase MS symptoms and fatigue.   Person(s) Educated Patient   Methods Explanation   Comprehension Verbalized understanding          PT Short Term Goals - 11/06/14 1426    PT SHORT TERM GOAL #1   Title Pt will be independent in HEP to improve strength, balance, flexibility, and endurance. Target date: 12/04/14.   Baseline No HEP.   Status On-going   PT SHORT TERM GOAL #2   Title Perform BERG and write goals if appropriate. Target date: 09/16/14.   Baseline Pt can perform static standing with feet apart for 19 seconds prior to requiring seated rest break.   Status Achieved   PT SHORT TERM GOAL #3   Title Pt will ambulate 300' over even terrain with LRAD with supervision to improve functional mobility. Target date: 12/04/14.   Baseline 100' with SBQC and min guard to min A to maintain balance.   Status On-going   PT SHORT TERM GOAL #4   Title Pt will improve gait speed to >/=1.97ft/sec. with LRAD to reduce falls risk. Target date: 12/04/14.   Baseline 1.85ft/sec. with SBQC   Status On-going   PT SHORT TERM GOAL #5   Title Pt will improve BERG score to >/=40/56 to decrease falls risk. Target date: 12/04/14.   Status New           PT Long Term Goals - 11/06/14 1428    PT LONG TERM GOAL #1   Title Pt will verbalize fall prevention  strategies to reduce falls risk. Target date: 01/01/15   Baseline Unable to verbalize any fall prevention strategies.   Status On-going   PT LONG TERM GOAL #2   Title Pt will ambulate 500' over even/uneven terrain with LRAD at MOD I level to improve functional mobility. Target date: 01/01/15.   Baseline 100' with SBQC with min guard to min A   Status On-going   PT LONG TERM GOAL #3   Title Pt will perform TUG with LRAD in </=13.5 seconds to reduce falls risk. Target date: 01/01/15.   Baseline 29.62 sec.  with SBQC   Status On-going   PT LONG TERM GOAL #4   Title Pt will report zero falls in the last 4 weeks to improve safety during functional mobility. Target date: 01/01/15.   Baseline 4 falls in the last six months.   Status On-going   PT LONG TERM GOAL #5   Title Pt will improve BERG score to >/=44/56 to decrease falls risk. Target date: 01/01/15.   Status New               Plan - 11/06/14 1424    Clinical Impression Statement Pt not seen since eval, as insurance (Medicaid) originally denied visits. However, once re-submitted Medicaid authorized 3 PT treatments. Pt required frequent seated rest breaks during gait and BERG due to fatigue and R LE pain. Pt's gait speed and balance (BERG) assessed and no significant changes noted since eval, so 08/2014 eval/goals are still applicable for pt. BERG and gait speed indicate pt is at increased risk for falls and would benefit from skilled PT to improve safety during functional mobility. PT will fax MD request for elevated toilet seat and bariatric RW. Continue with POC.   Pt will benefit from skilled therapeutic intervention in order to improve on the following deficits Abnormal gait;Obesity;Decreased safety awareness;Decreased endurance;Decreased knowledge of use of DME;Decreased balance;Decreased mobility;Decreased strength;Increased muscle spasms;Other (comment);Impaired flexibility   Rehab Potential Good   Clinical Impairments Affecting Rehab  Potential R LE pain   PT Frequency 2x / week   PT Duration 8 weeks   PT Treatment/Interventions ADLs/Self Care Home Management;Gait training;Neuromuscular re-education;Stair training;Biofeedback;Functional mobility training;Patient/family education;Therapeutic activities;Cryotherapy;Electrical Stimulation;Therapeutic exercise;Manual techniques;Energy conservation;DME Instruction;Balance training   PT Next Visit Plan Initiate balance/strength/flexibility HEP. Provide pt with fall prevention handout.   Consulted and Agree with Plan of Care Patient        Problem List Patient Active Problem List   Diagnosis Date Noted  . Impotence 04/10/2014  . Generalized pain 04/10/2014  . Multiple sclerosis 04/10/2014  . MS (multiple sclerosis) 07/09/2013  . Numbness on right side 07/04/2013  . Nystagmus 07/04/2013  . Weakness of right side of body 07/04/2013    Sherilynn Dieu L 11/06/2014, 2:35 PM  [facility] [address] [place], [place], [address] Phone: [phone]   Fax:  [phone]     Zerita Boers, PT,DPT 11/06/2014 2:35 PM Phone: [phone] Fax: [phone]

## 2014-11-20 ENCOUNTER — Ambulatory Visit: Payer: Medicaid Other

## 2014-12-04 ENCOUNTER — Ambulatory Visit: Payer: Medicaid Other | Attending: Student

## 2014-12-04 DIAGNOSIS — R296 Repeated falls: Secondary | ICD-10-CM | POA: Diagnosis present

## 2014-12-04 DIAGNOSIS — R2681 Unsteadiness on feet: Secondary | ICD-10-CM | POA: Diagnosis present

## 2014-12-04 DIAGNOSIS — R269 Unspecified abnormalities of gait and mobility: Secondary | ICD-10-CM

## 2014-12-04 NOTE — Therapy (Signed)
[facility] [address] [place], [place], [address] Phone: [phone]   Fax:  [phone]  Physical Therapy Treatment  Patient Details  Name: Bobby Stafford MRN: [id number] Date of Birth: 1975/12/06 Referring Provider:  Quentin Angst, MD  Encounter Date: 12/04/2014      PT End of Session - 12/04/14 1345    Visit Number 3   Number of Visits 4   Date for PT Re-Evaluation 01/06/15   PT Start Time 1242   PT Stop Time 1329   PT Time Calculation (min) 47 min   Equipment Utilized During Treatment Gait belt   Activity Tolerance Patient tolerated treatment well   Behavior During Therapy [facility] for tasks assessed/performed      Past Medical History  Diagnosis Date  . MS (multiple sclerosis)   . Arthritis     Past Surgical History  Procedure Laterality Date  . No surgerys      There were no vitals filed for this visit.  Visit Diagnosis:  Abnormality of gait  Unsteadiness  Frequent falls      Subjective Assessment - 12/04/14 1245    Subjective Pt reported he fell on 12/01/14 when walking on a hill outside of his house (he was carrying boxes as he was moving out of his girlfriend's house). Pt had a hard time getting up but was able to eventually get up on his own. Pt denied hitting his head or receiving any other injuries from fall.   Pertinent History MS, arthritis   Patient Stated Goals strengthening legs, joining fitness center after PT, and walk better   Currently in Pain? Yes   Pain Score 7    Pain Location Other (Comment)  all over   Pain Orientation Other (Comment)  "all over"   Pain Descriptors / Indicators --  stiffness   Pain Type Chronic pain   Pain Onset More than a month ago   Pain Frequency Constant   Aggravating Factors  moving too much   Pain Relieving Factors medication            Therex: Seated B hamstring stretch and B LAQs. Cue for technique. Added to HEP, please see pt  instructions for details.  Neuro re-ed: Pt performed balance exercises with mat behind him and chair in front of him for safety. Supervision to min guard to ensure safety. Pt performed all activities with feet apart and feet together.  Please see pt instructions for details.                     PT Education - 12/04/14 1345    Education provided Yes   Education Details Fall prevention strategy handout, balance/flexibility HEP and initiated strengthening HEP.   Person(s) Educated Patient   Methods Explanation;Demonstration;Handout;Verbal cues   Comprehension Verbalized understanding;Returned demonstration          PT Short Term Goals - 12/04/14 1348    PT SHORT TERM GOAL #1   Title Pt will be independent in HEP to improve strength, balance, flexibility, and endurance. Target date: 12/04/14.   Baseline No HEP.   Status On-going   PT SHORT TERM GOAL #2   Title Perform BERG and write goals if appropriate. Target date: 09/16/14.   Baseline Pt can perform static standing with feet apart for 19 seconds prior to requiring seated rest break.   Status Achieved   PT SHORT TERM GOAL #3   Title Pt will ambulate 300' over even terrain with  LRAD with supervision to improve functional mobility. Target date: 12/04/14.   Baseline 100' with SBQC and min guard to min A to maintain balance.   Status On-going   PT SHORT TERM GOAL #4   Title Pt will improve gait speed to >/=1.17ft/sec. with LRAD to reduce falls risk. Target date: 12/04/14.   Baseline 1.84ft/sec. with SBQC   Status On-going   PT SHORT TERM GOAL #5   Title Pt will improve BERG score to >/=40/56 to decrease falls risk. Target date: 12/04/14.   Status On-going           PT Long Term Goals - 12/04/14 1348    PT LONG TERM GOAL #1   Title Pt will verbalize fall prevention strategies to reduce falls risk. Target date: 01/01/15   Baseline Unable to verbalize any fall prevention strategies.   Status On-going   PT LONG TERM GOAL #2    Title Pt will ambulate 500' over even/uneven terrain with LRAD at MOD I level to improve functional mobility. Target date: 01/01/15.   Baseline 100' with SBQC with min guard to min A   Status On-going   PT LONG TERM GOAL #3   Title Pt will perform TUG with LRAD in </=13.5 seconds to reduce falls risk. Target date: 01/01/15.   Baseline 29.62 sec. with SBQC   Status On-going   PT LONG TERM GOAL #4   Title Pt will report zero falls in the last 4 weeks to improve safety during functional mobility. Target date: 01/01/15.   Baseline 4 falls in the last six months.   Status On-going   PT LONG TERM GOAL #5   Title Pt will improve BERG score to >/=44/56 to decrease falls risk. Target date: 01/01/15.   Status On-going               Plan - 12/04/14 1346    Clinical Impression Statement Pt tolerated balance HEP well but did require 3 seated rest breaks 2/2 fatigue. Pt continues to experience increased postural sway during balance activities with eyes closed and narrow BOS. Continue with POC.   Pt will benefit from skilled therapeutic intervention in order to improve on the following deficits Abnormal gait;Obesity;Decreased safety awareness;Decreased endurance;Decreased knowledge of use of DME;Decreased balance;Decreased mobility;Decreased strength;Increased muscle spasms;Other (comment);Impaired flexibility   Rehab Potential Good   Clinical Impairments Affecting Rehab Potential R LE pain   PT Frequency 2x / week  medicaid only allowing 4 visits   PT Duration 8 weeks   PT Treatment/Interventions ADLs/Self Care Home Management;Gait training;Neuromuscular re-education;Stair training;Biofeedback;Functional mobility training;Patient/family education;Therapeutic activities;Cryotherapy;Electrical Stimulation;Therapeutic exercise;Manual techniques;Energy conservation;DME Instruction;Balance training   PT Next Visit Plan Finish strengthening HEP and assess STGs.   Consulted and Agree with Plan of Care  Patient        Problem List Patient Active Problem List   Diagnosis Date Noted  . Impotence 04/10/2014  . Generalized pain 04/10/2014  . Multiple sclerosis 04/10/2014  . MS (multiple sclerosis) 07/09/2013  . Numbness on right side 07/04/2013  . Nystagmus 07/04/2013  . Weakness of right side of body 07/04/2013    Tausha Milhoan L 12/04/2014, 1:49 PM  [facility] [address] [place], [place], [address] Phone: [phone]   Fax:  [phone]     Zerita Boers, PT,DPT 12/04/2014 1:49 PM Phone: [phone] Fax: [phone]

## 2014-12-04 NOTE — Patient Instructions (Signed)
Fall Prevention and Home Safety Falls cause injuries and can affect all age groups. It is possible to use preventive measures to significantly decrease the likelihood of falls. There are many simple measures which can make your home safer and prevent falls. OUTDOORS  Repair cracks and edges of walkways and driveways.  Remove high doorway thresholds.  Trim shrubbery on the main path into your home.  Have good outside lighting.  Clear walkways of tools, rocks, debris, and clutter.  Check that handrails are not broken and are securely fastened. Both sides of steps should have handrails.  Have leaves, snow, and ice cleared regularly.  Use sand or salt on walkways during winter months.  In the garage, clean up grease or oil spills. BATHROOM  Install night lights.  Install grab bars by the toilet and in the tub and shower.  Use non-skid mats or decals in the tub or shower.  Place a plastic non-slip stool in the shower to sit on, if needed.  Keep floors dry and clean up all water on the floor immediately.  Remove soap buildup in the tub or shower on a regular basis.  Secure bath mats with non-slip, double-sided rug tape.  Remove throw rugs and tripping hazards from the floors. BEDROOMS  Install night lights.  Make sure a bedside light is easy to reach.  Do not use oversized bedding.  Keep a telephone by your bedside.  Have a firm chair with side arms to use for getting dressed.  Remove throw rugs and tripping hazards from the floor. KITCHEN  Keep handles on pots and pans turned toward the center of the stove. Use back burners when possible.  Clean up spills quickly and allow time for drying.  Avoid walking on wet floors.  Avoid hot utensils and knives.  Position shelves so they are not too high or low.  Place commonly used objects within easy reach.  If necessary, use a sturdy step stool with a grab bar when reaching.  Keep electrical cables out of the  way.  Do not use floor polish or wax that makes floors slippery. If you must use wax, use non-skid floor wax.  Remove throw rugs and tripping hazards from the floor. STAIRWAYS  Never leave objects on stairs.  Place handrails on both sides of stairways and use them. Fix any loose handrails. Make sure handrails on both sides of the stairways are as long as the stairs.  Check carpeting to make sure it is firmly attached along stairs. Make repairs to worn or loose carpet promptly.  Avoid placing throw rugs at the top or bottom of stairways, or properly secure the rug with carpet tape to prevent slippage. Get rid of throw rugs, if possible.  Have an electrician put in a light switch at the top and bottom of the stairs. OTHER FALL PREVENTION TIPS  Wear low-heel or rubber-soled shoes that are supportive and fit well. Wear closed toe shoes.  When using a stepladder, make sure it is fully opened and both spreaders are firmly locked. Do not climb a closed stepladder.  Add color or contrast paint or tape to grab bars and handrails in your home. Place contrasting color strips on first and last steps.  Learn and use mobility aids as needed. Install an electrical emergency response system.  Turn on lights to avoid dark areas. Replace light bulbs that burn out immediately. Get light switches that glow.  Arrange furniture to create clear pathways. Keep furniture in the same place.    Firmly attach carpet with non-skid or double-sided tape.  Eliminate uneven floor surfaces.  Select a carpet pattern that does not visually hide the edge of steps.  Be aware of all pets. OTHER HOME SAFETY TIPS  Set the water temperature for 120 F (48.8 C).  Keep emergency numbers on or near the telephone.  Keep smoke detectors on every level of the home and near sleeping areas. Document Released: 04/08/2002 Document Revised: 10/18/2011 Document Reviewed: 07/08/2011 [facility] Patient Information 2015  [place], [place]. This information is not intended to replace advice given to you by your health care provider. Make sure you discuss any questions you have with your health care provider.    Perform at kitchen sink with a sturdy chair behind you for safety:  Feet Apart, Head Motion - Eyes Open   With eyes open, feet apart, move head slowly: up and down 10 times and side to side 10 times. Repeat __3__ times per session. Do __1__ sessions per day.  Copyright  VHI. All rights reserved.  Feet Apart, Varied Arm Positions - Eyes Closed   Stand with feet shoulder width apart and arms at your side. Close eyes and visualize upright position. Hold __10-20__ seconds. Repeat __3__ times per session. Do __1__ sessions per day.  Copyright  VHI. All rights reserved.  Feet Together, Varied Arm Positions - Eyes Open   With eyes open, feet together, arms at your side, look straight ahead at a stationary object. Hold _30___ seconds. Repeat __3__ times per session. Do __1__ sessions per day.  Copyright  VHI. All rights reserved.  Weight Shift: Anterior / Posterior (Limits of Stability)   Slowly shift weight backward until toes begin to rise off floor. Return to starting position. Shift weight slowly forward until heels begin to rise off floor. Hold each position __2__ seconds. Repeat __20__ times per session. Do __1__ sessions per day.  Copyright  VHI. All rights reserved.    HIP: Hamstrings - Short Sitting   Rest leg on raised surface. Keep knee straight. Lift chest. Hold _30__ seconds. _3__ reps per set, _2-3__ sets per day, __7_ days per week  Copyright  VHI. All rights reserved.   KNEE: Extension, Long Arc Quad (Band)   Tie band around ankles. Pull band forward until knee is straight. Hold _2__ seconds. Use __green______ band. _10__ reps per set, _3__ sets per day, _3-4__ days per week  Copyright  VHI. All rights reserved.    SCAPULA: Retraction   Keep elbows at your side  and hold onto green band (tie one end of band around sturdy railing or in between door). Pinch shoulder blades together. Do not shrug shoulders. Hold _2__ seconds. __10_ reps per set, _3__ sets per day, _3-4__ days per week  Copyright  VHI. All rights reserved.

## 2014-12-23 ENCOUNTER — Ambulatory Visit: Payer: Medicaid Other

## 2014-12-23 DIAGNOSIS — R269 Unspecified abnormalities of gait and mobility: Secondary | ICD-10-CM

## 2014-12-23 DIAGNOSIS — R296 Repeated falls: Secondary | ICD-10-CM

## 2014-12-23 DIAGNOSIS — R2681 Unsteadiness on feet: Secondary | ICD-10-CM

## 2014-12-23 NOTE — Patient Instructions (Signed)
Hamstring Curl: Resisted (Sitting)   Facing anchor with red band on right ankle, leg straight out, bend knee. Repeat with left leg. Repeat __10__ times per set. Do __3__ sets per session. Do _3-4___ sessions per week.   http://orth.exer.us/668   Copyright  VHI. All rights reserved.    ABDUCTION: Sitting (Active)   Sit with feet flat and band tied around above your knees. Keep feet together and bring knees out to the side (like a clamshell). Complete _3__ sets of _10__ repetitions. Perform _3-4__ sessions per week.    Progress to standing at the counter with hands on counter for support and side step along counter (10 feet, 4 times), when this is easy, tie band around ankles and side step.  Copyright  VHI. All rights reserved.   Bridge   Lie back, legs bent. Inhale, pressing hips up and tuck in stomach. Keeping ribs in, lengthen lower back. Hold for 2 seconds. Exhale, rolling down along spine from top. Repeat __20__ times. Do _1___ sessions per day.  Copyright  VHI. All rights reserved.

## 2014-12-23 NOTE — Therapy (Signed)
[facility] [address] [place], [place], [address] Phone: [phone]   Fax:  [phone]  Physical Therapy Treatment  Patient Details  Name: Bobby Stafford MRN: [id number] Date of Birth: 09/24/1975 Referring Provider:  Tresa Garter, MD  Encounter Date: 12/23/2014      PT End of Session - 12/23/14 1232    Visit Number 4   Number of Visits 4   Date for PT Re-Evaluation 01/06/15   Authorization - Visit Number 4   Authorization - Number of Visits 4   PT Start Time 2376   PT Stop Time 1225   PT Time Calculation (min) 48 min   Equipment Utilized During Treatment Gait belt   Activity Tolerance Patient tolerated treatment well   Behavior During Therapy [facility] for tasks assessed/performed      Past Medical History  Diagnosis Date  . MS (multiple sclerosis)   . Arthritis     Past Surgical History  Procedure Laterality Date  . No surgerys      There were no vitals filed for this visit.  Visit Diagnosis:  Abnormality of gait  Unsteadiness  Frequent falls      Subjective Assessment - 12/23/14 1140    Subjective Pt reported he fell twice in the shower, but pt denied hitting his head or having any other injuries from fall.    Patient is accompained by: Family member  Tamika   Pertinent History MS, arthritis   Patient Stated Goals strengthening legs, joining fitness center after PT, and walk better   Currently in Pain? No/denies        Therex: Pt performed strengthening HEP with cues and demonstration for technique. Please see pt instructions for details.                 [facility] Adult PT Treatment/Exercise - 12/23/14 1206    Ambulation/Gait   Ambulation/Gait Yes   Ambulation/Gait Assistance 5: Supervision   Ambulation/Gait Assistance Details Cues to stay withing RW, maintain upright posture, and to improve stride length.   Ambulation Distance (Feet) 220 Feet   Assistive device Rolling  walker   Gait Pattern Decreased stride length;Decreased stance time - right;Decreased dorsiflexion - right;Lateral hip instability;Right flexed knee in stance;Step-through pattern   Ambulation Surface Level;Indoor   Gait velocity 1.77f/sec.  with RW   Berg Balance Test   Sit to Stand Able to stand without using hands and stabilize independently   Standing Unsupported Able to stand 2 minutes with supervision   Sitting with Back Unsupported but Feet Supported on Floor or Stool Able to sit safely and securely 2 minutes   Stand to Sit Sits safely with minimal use of hands   Transfers Able to transfer safely, minor use of hands   Standing Unsupported with Eyes Closed Able to stand 10 seconds with supervision   Standing Ubsupported with Feet Together Able to place feet together independently and stand 1 minute safely   From Standing, Reach Forward with Outstretched Arm Can reach confidently >25 cm (10")   From Standing Position, Pick up Object from Floor Able to pick up shoe, needs supervision   From Standing Position, Turn to Look Behind Over each Shoulder Looks behind from both sides and weight shifts well   Turn 360 Degrees Able to turn 360 degrees safely but slowly   Standing Unsupported, Alternately Place Feet on Step/Stool Able to complete >2 steps/needs minimal assist   Standing Unsupported, One Foot in Front Able to plae foot  ahead of the other independently and hold 30 seconds   Standing on One Leg Tries to lift leg/unable to hold 3 seconds but remains standing independently   Total Score 44                PT Education - 12/23/14 1231    Education provided Yes   Education Details Strengthening HEP. PT encouraged pt to continue with HEP and to improve walking distances/endurance. PT encouraged pt to enroll in a gym with pool classes.   Person(s) Educated Patient;Spouse   Methods Explanation;Demonstration;Verbal cues;Handout   Comprehension Verbalized understanding;Returned  demonstration          PT Short Term Goals - 12/23/14 1233    PT SHORT TERM GOAL #1   Title Pt will be independent in HEP to improve strength, balance, flexibility, and endurance. Target date: 12/04/14.   Baseline No HEP.   Status Achieved   PT SHORT TERM GOAL #2   Title Perform BERG and write goals if appropriate. Target date: 09/16/14.   Baseline Pt can perform static standing with feet apart for 19 seconds prior to requiring seated rest break.   Status Achieved   PT SHORT TERM GOAL #3   Title Pt will ambulate 300' over even terrain with LRAD with supervision to improve functional mobility. Target date: 12/04/14.   Baseline 220' with RW and supervision   Status Partially Met   PT SHORT TERM GOAL #4   Title Pt will improve gait speed to >/=1.28f/sec. with LRAD to reduce falls risk. Target date: 12/04/14.   Baseline 1.661fsec with RW   Status Partially Met   PT SHORT TERM GOAL #5   Title Pt will improve BERG score to >/=40/56 to decrease falls risk. Target date: 12/04/14.   Baseline 44/56   Status Achieved           PT Long Term Goals - 12/23/14 1233    PT LONG TERM GOAL #1   Title Pt will verbalize fall prevention strategies to reduce falls risk. Target date: 01/01/15   Baseline Unable to verbalize any fall prevention strategies.   Status On-going   PT LONG TERM GOAL #2   Title Pt will ambulate 500' over even/uneven terrain with LRAD at MOD I level to improve functional mobility. Target date: 01/01/15.   Baseline 100' with SBQC with min guard to min A   Status On-going   PT LONG TERM GOAL #3   Title Pt will perform TUG with LRAD in </=13.5 seconds to reduce falls risk. Target date: 01/01/15.   Baseline 29.62 sec. with SBQC   Status On-going   PT LONG TERM GOAL #4   Title Pt will report zero falls in the last 4 weeks to improve safety during functional mobility. Target date: 01/01/15.   Baseline 4 falls in the last six months.   Status On-going   PT LONG TERM GOAL #5   Title Pt  will improve BERG score to >/=44/56 to decrease falls risk. Target date: 01/01/15.   Baseline 44/56   Status Achieved               Plan - 12/23/14 1232    Clinical Impression Statement Pt discharging today, due to limited Medicaid visits. Please see d/c summary for details.         Problem List Patient Active Problem List   Diagnosis Date Noted  . Impotence 04/10/2014  . Generalized pain 04/10/2014  . Multiple sclerosis 04/10/2014  . MS (multiple sclerosis)  07/09/2013  . Numbness on right side 07/04/2013  . Nystagmus 07/04/2013  . Weakness of right side of body 07/04/2013    Marymargaret Kirker L 12/23/2014, 12:35 PM  [facility] [address] [place], [place], [address] Phone: [phone]   Fax:  [phone]     PHYSICAL THERAPY DISCHARGE SUMMARY  Visits from Start of Care: 4  Current functional level related to goals / functional outcomes:     PT Short Term Goals - 12/23/14 1233    PT SHORT TERM GOAL #1   Title Pt will be independent in HEP to improve strength, balance, flexibility, and endurance. Target date: 12/04/14.   Baseline No HEP.   Status Achieved   PT SHORT TERM GOAL #2   Title Perform BERG and write goals if appropriate. Target date: 09/16/14.   Baseline Pt can perform static standing with feet apart for 19 seconds prior to requiring seated rest break.   Status Achieved   PT SHORT TERM GOAL #3   Title Pt will ambulate 300' over even terrain with LRAD with supervision to improve functional mobility. Target date: 12/04/14.   Baseline 220' with RW and supervision   Status Partially Met   PT SHORT TERM GOAL #4   Title Pt will improve gait speed to >/=1.54f/sec. with LRAD to reduce falls risk. Target date: 12/04/14.   Baseline 1.666fsec with RW   Status Partially Met   PT SHORT TERM GOAL #5   Title Pt will improve BERG score to >/=40/56 to decrease falls risk. Target date: 12/04/14.   Baseline  44/56   Status Achieved         PT Long Term Goals - 12/23/14 1233    PT LONG TERM GOAL #1   Title Pt will verbalize fall prevention strategies to reduce falls risk. Target date: 01/01/15   Baseline Unable to verbalize any fall prevention strategies.   Status On-going   PT LONG TERM GOAL #2   Title Pt will ambulate 500' over even/uneven terrain with LRAD at MOD I level to improve functional mobility. Target date: 01/01/15.   Baseline 100' with SBQC with min guard to min A   Status On-going   PT LONG TERM GOAL #3   Title Pt will perform TUG with LRAD in </=13.5 seconds to reduce falls risk. Target date: 01/01/15.   Baseline 29.62 sec. with SBQC   Status On-going   PT LONG TERM GOAL #4   Title Pt will report zero falls in the last 4 weeks to improve safety during functional mobility. Target date: 01/01/15.   Baseline 4 falls in the last six months.   Status On-going   PT LONG TERM GOAL #5   Title Pt will improve BERG score to >/=44/56 to decrease falls risk. Target date: 01/01/15.   Baseline 44/56   Status Achieved        Remaining deficits: Decreased endurance, decreased strength and balance.   Education / Equipment: HEP  Plan: Patient agrees to discharge.  Patient goals were partially met. Patient is being discharged due to financial reasons.  ?????       Pt discharged today, as Medicaid paid for one eval and three treatment sessions and pt could not afford self pay visits.  JeGeoffry ParadisePT,DPT 12/23/2014 12:35 PM Phone: [phone]: [phone]

## 2015-01-08 ENCOUNTER — Ambulatory Visit: Payer: Medicaid Other | Admitting: Internal Medicine

## 2015-03-05 ENCOUNTER — Ambulatory Visit: Payer: Medicaid Other | Admitting: Internal Medicine

## 2015-03-09 ENCOUNTER — Encounter: Payer: Self-pay | Admitting: Family Medicine

## 2015-03-09 ENCOUNTER — Ambulatory Visit (HOSPITAL_BASED_OUTPATIENT_CLINIC_OR_DEPARTMENT_OTHER): Payer: Medicaid Other | Admitting: Family Medicine

## 2015-03-09 ENCOUNTER — Ambulatory Visit: Payer: Medicaid Other | Admitting: Family Medicine

## 2015-03-09 VITALS — BP 109/70 | HR 77 | Temp 98.6°F | Resp 18 | Ht 73.5 in | Wt 370.0 lb

## 2015-03-09 DIAGNOSIS — G35 Multiple sclerosis: Secondary | ICD-10-CM

## 2015-03-09 DIAGNOSIS — Z13220 Encounter for screening for lipoid disorders: Secondary | ICD-10-CM

## 2015-03-09 DIAGNOSIS — N529 Male erectile dysfunction, unspecified: Secondary | ICD-10-CM | POA: Diagnosis not present

## 2015-03-09 LAB — CBC WITH DIFFERENTIAL/PLATELET
BASOS PCT: 0 % (ref 0–1)
Basophils Absolute: 0 10*3/uL (ref 0.0–0.1)
Eosinophils Absolute: 0.4 10*3/uL (ref 0.0–0.7)
Eosinophils Relative: 5 % (ref 0–5)
HEMATOCRIT: 40.8 % (ref 39.0–52.0)
HEMOGLOBIN: 14.1 g/dL (ref 13.0–17.0)
LYMPHS PCT: 43 % (ref 12–46)
Lymphs Abs: 3.1 10*3/uL (ref 0.7–4.0)
MCH: 31.4 pg (ref 26.0–34.0)
MCHC: 34.6 g/dL (ref 30.0–36.0)
MCV: 90.9 fL (ref 78.0–100.0)
MONO ABS: 0.5 10*3/uL (ref 0.1–1.0)
MONOS PCT: 7 % (ref 3–12)
MPV: 10.7 fL (ref 8.6–12.4)
NEUTROS ABS: 3.3 10*3/uL (ref 1.7–7.7)
NEUTROS PCT: 45 % (ref 43–77)
Platelets: 286 10*3/uL (ref 150–400)
RBC: 4.49 MIL/uL (ref 4.22–5.81)
RDW: 12.8 % (ref 11.5–15.5)
WBC: 7.3 10*3/uL (ref 4.0–10.5)

## 2015-03-09 LAB — COMPREHENSIVE METABOLIC PANEL
ALT: 28 U/L (ref 9–46)
AST: 14 U/L (ref 10–40)
Albumin: 4.2 g/dL (ref 3.6–5.1)
Alkaline Phosphatase: 59 U/L (ref 40–115)
BUN: 12 mg/dL (ref 7–25)
CALCIUM: 9.2 mg/dL (ref 8.6–10.3)
CHLORIDE: 105 mmol/L (ref 98–110)
CO2: 22 mmol/L (ref 20–31)
Creat: 0.95 mg/dL (ref 0.60–1.35)
GLUCOSE: 97 mg/dL (ref 65–99)
POTASSIUM: 4.3 mmol/L (ref 3.5–5.3)
Sodium: 138 mmol/L (ref 135–146)
Total Bilirubin: 0.9 mg/dL (ref 0.2–1.2)
Total Protein: 7 g/dL (ref 6.1–8.1)

## 2015-03-09 LAB — LIPID PANEL
CHOL/HDL RATIO: 7 ratio — AB (ref <=5.0)
CHOLESTEROL: 209 mg/dL — AB (ref 125–200)
HDL: 30 mg/dL — AB (ref >=40)
LDL CALC: 150 mg/dL — AB (ref <130)
TRIGLYCERIDES: 147 mg/dL (ref <150)
VLDL: 29 mg/dL (ref <30)

## 2015-03-09 MED ORDER — TADALAFIL 20 MG PO TABS: 10.0000 mg | ORAL_TABLET | ORAL | Status: DC | PRN

## 2015-03-09 NOTE — Patient Instructions (Signed)
Multiple Sclerosis °Multiple sclerosis (MS) is a disease of the central nervous system. It leads to the loss of the insulating covering of the nerves (myelin sheath) of your brain. When this happens, brain signals do not get sent properly or may not get sent at all. The age of onset of MS varies.  °CAUSES °The cause of MS is unknown. However, it is more common in the northern [place] than in the southern [place]. °RISK FACTORS °There is a higher number of women with MS than men. MS is not an illness that is passed down to you from your family members (inherited). However, your risk of MS is higher if you have a relative with MS. °SIGNS AND SYMPTOMS  °The symptoms of MS occur in episodes or attacks. These attacks may last weeks to months. There may be long periods of almost no symptoms between attacks. The symptoms of MS vary. This is because of the many different ways it affects the central nervous system. The main symptoms of MS include: °· Vision problems and eye pain. °· Numbness. °· Weakness. °· Inability to move your arms, hands, feet, or legs (paralysis). °· Balance problems. °· Tremors. °DIAGNOSIS  °Your health care provider can diagnose MS with the help of imaging exams and lab tests. These may include specialized X-ray exams and spinal fluid tests. The best imaging exam to confirm a diagnosis of MS is an MRI. °TREATMENT  °There is no known cure for MS, but there are medicines that can decrease the number and frequency of attacks. Steroids are often used for short-term relief. Physical and occupational therapy may also help. There are also many new alternative or complementary treatments available to help control the symptoms of MS. Ask your health care provider if any of these other options are right for you. °HOME CARE INSTRUCTIONS  °· Take medicines as directed by your health care provider. °· Exercise as directed by your health care provider. °SEEK MEDICAL CARE IF: °You begin to feel  depressed. °SEEK IMMEDIATE MEDICAL CARE IF: °· You develop paralysis. °· You have problems with bladder, bowel, or sexual function. °· You develop mental changes, such as forgetfulness or mood swings. °· You have a period of uncontrolled movements (seizure). °  °This information is not intended to replace advice given to you by your health care provider. Make sure you discuss any questions you have with your health care provider. °  °Document Released: 04/15/2000 Document Revised: 04/23/2013 Document Reviewed: 12/24/2012 °Elsevier Interactive Patient Education ©2016 [employer] ° °

## 2015-03-09 NOTE — Progress Notes (Signed)
Pt's here for hospital f/up for multiple sclerosis  Pt's reports having pain both knees, rated pain 5/10.  Pt requesting a refill of cialis.

## 2015-03-09 NOTE — Progress Notes (Signed)
CC: Follow up on Multiple Scerosis  HPI: Bobby Stafford is a 39 y.o. male with a history of multiple sclerosis, morbid obesity, erectile dysfunction who comes in today for follow-up visit. He last saw his neurologist at [facility] on 03/03/15 and is scheduled for an MRI of his brain. He remains onTecfidera infusions for his MS.  Today he is requesting a refill of his Cialis which he takes for erectile dysfunction. He is also complaining of bilateral knee pain right worse than left which he rates as a 5/10. He sits in a scooter most of the time and not home ambulates with a walker or cane while holding onto surfaces for support as well. He has fallen a couple of times within the last for about 3 months ago. Review of his chart indicates he was discharged from physical therapy in 12/2014 as Medicaid only paid for one evaluation on 3 therapy sessions.  He is accompanied by his girlfriend and they have questions about his need for a lower extremity brace which they saw online and this "stimulates the knee " given he has foot drop on the right; they also requesting a hospital bed.    Patient has No headache, No chest pain, No abdominal pain - No Nausea,  No Cough - SOB.  No Known Allergies Past Medical History  Diagnosis Date  . MS (multiple sclerosis) (HCC)   . Arthritis    Current Outpatient Prescriptions on File Prior to Visit  Medication Sig Dispense Refill  . baclofen (LIORESAL) 10 MG tablet Take 1 tablet (10 mg total) by mouth 3 (three) times daily. 90 tablet 3  . cholecalciferol (VITAMIN D) 1000 UNITS tablet Take 4,000 Units by mouth daily.    Marland Kitchen gabapentin (NEURONTIN) 300 MG capsule TAKE 3 CAPSULES BY MOUTH 3 TIMES DAILY 270 capsule 2  . oxyCODONE-acetaminophen (PERCOCET) 10-325 MG per tablet Take 1 tablet by mouth every 4 (four) hours as needed for pain.    . tadalafil (CIALIS) 20 MG tablet Take 0.5 tablets (10 mg total) by mouth every other day as needed for erectile  dysfunction. 90 tablet 3  . diclofenac (FLECTOR) 1.3 % PTCH Place 1 patch onto the skin 2 (two) times daily. (Patient not taking: Reported on 11/06/2014) 3 patch 2  . Dimethyl Fumarate (TECFIDERA) 240 MG CPDR Take 1 capsule (240 mg total) by mouth every morning. (Patient not taking: Reported on 08/19/2014) 90 capsule 3  . LORazepam (ATIVAN) 1 MG tablet Take 1 tablet (1 mg total) by mouth every 8 (eight) hours as needed (spasms). (Patient not taking: Reported on 04/10/2014) 90 tablet 0  . naproxen sodium (ANAPROX) 220 MG tablet Take 220 mg by mouth 2 (two) times daily as needed (pain).     . traMADol (ULTRAM) 50 MG tablet Take 1 tablet (50 mg total) by mouth every 8 (eight) hours as needed. (Patient not taking: Reported on 08/19/2014) 90 tablet 1   No current facility-administered medications on file prior to visit.   Family History  Problem Relation Age of Onset  . Diabetes type I Father    Social History   Social History  . Marital Status: Single    Spouse Name: N/A  . Number of Children: N/A  . Years of Education: N/A   Occupational History  . Not on file.   Social History Main Topics  . Smoking status: Current Every Day Smoker -- 0.50 packs/day for 24 years    Types: Cigarettes  . Smokeless tobacco:  Never Used     Comment: patient is aware that smoking is not good for him   . Alcohol Use: No  . Drug Use: 7.00 per week    Special: Marijuana  . Sexual Activity:    Partners: Female   Other Topics Concern  . Not on file   Social History Narrative    Review of Systems: Constitutional: Negative for fever, chills, diaphoresis, activity change, appetite change and fatigue. HENT: Negative for ear pain, nosebleeds, congestion, facial swelling, rhinorrhea, neck pain, neck stiffness and ear discharge.  Eyes: Negative for pain, discharge, redness, itching and visual disturbance. Respiratory: Negative for cough, choking, chest tightness, shortness of breath, wheezing and stridor.    Cardiovascular: Negative for chest pain, palpitations and leg swelling. Gastrointestinal: Negative for abdominal distention. Genitourinary: Negative for dysuria, urgency, frequency, hematuria, flank pain, decreased urine volume, difficulty urinating and dyspareunia, positive for erectile dysfunction. Musculoskeletal: Knee pain Neurological: Negative for dizziness, tremors, seizures, syncope, facial asymmetry, speech difficulty, positive for weakness in the extremities and numbness.  Hematological: Negative for adenopathy. Does not bruise/bleed easily. Psychiatric/Behavioral: Negative for hallucinations, behavioral problems, confusion, dysphoric mood, decreased concentration and agitation.    Objective:   Filed Vitals:   03/09/15 0956  BP: 109/70  Pulse: 77  Temp: 98.6 F (37 C)  Resp: 18    Physical Exam: Constitutional: Patient appears morbidly obese, well-developed and well-nourished. No distress. CVS: RRR, S1/S2 +, no murmurs, no gallops, no carotid bruit.  Pulmonary: Effort and breath sounds normal, no stridor, rhonchi, wheezes, rales.  Abdominal: Soft. BS +,  no distension, tenderness, rebound or guarding.  Musculoskeletal: No edema of the knees, full range of motion on the left knee, no tenderness; right knee is mildly tender on flexion and extension.  Lymphadenopathy: No lymphadenopathy noted, cervical, inguinal or axillary Neuro: Alert. Motor strength is 4/5 in left upper extremity, 4/5 in left lower extremity, 3+/5 in right upper extremity, 3/5 in right lower extremity.  Skin: Skin is warm and dry. No rash noted. Not diaphoretic. No erythema. No pallor. Psychiatric: Normal mood and affect. Behavior, judgment, thought content normal.  Lab Results  Component Value Date   WBC 4.5 01/08/2014   HGB 15.1 01/08/2014   HCT 41.8 01/08/2014   MCV 88.6 01/08/2014   PLT 249 01/08/2014   Lab Results  Component Value Date   CREATININE 1.14 10/16/2013   BUN 14 10/16/2013   NA  144 10/16/2013   K 3.8 10/16/2013   CL 106 10/16/2013   CO2 21 10/16/2013    Lab Results  Component Value Date   HGBA1C 4.9 10/10/2013   CLINICAL DATA: Multiple sclerosis.  EXAM: MRI HEAD WITH CONTRAST  TECHNIQUE: Multiplanar, multiecho pulse sequences of the brain and surrounding structures were obtained with intravenous contrast.  COMPARISON: MRI brain without contrast 01/05/2014. MRI brain without and with contrast 07/04/2013.  CONTRAST: 53mL MULTIHANCE GADOBENATE DIMEGLUMINE 529 MG/ML IV SOLN  FINDINGS: Numerous periventricular and subcortical T2 and FLAIR hyperintensities are again seen bilaterally. There is no significant interval change.  The postcontrast images demonstrate no pathologic enhancement. Specifically, the white matter lesions do not enhance. Midline structures are normal.  IMPRESSION: 1. No enhancement of numerous periventricular and subcortical T2 hyperintense lesions compatible with multiple sclerosis. 2. The size and number of lesions is not significantly changed.   Electronically Signed  By: Gennette Pac M.D.  On: 01/17/2014 10:03    Assessment and plan:  Sclerosis: Last therapy session was in 12/2014. He could benefit from additional  therapy sessions; hopefully referral at the beginning of the new year old be covered by Medicaid; I have explained to the patient that hopefully the issue of the brace will be addressed at physical therapy as he will be needing as specific form of ankle leg foot brace for his foot drop but he is interested in getting a knee brace which she saw online which has "stimulators" Referred to home health for hospital bed and hopefully home PT Followed by neurology at [facility].  Knee pain: Current symptoms might be secondary to limited use of the knee. He is currently on an NSAID and receives Percocet for pain management. No evidence of knee effusion. Advised to continue home  flexing and extension exercises which he had learned from PT.   Erectile dysfunction: Refill Cialis.  Blood work ordered today including lipid panel.  This note has been created with Education officer, environmental. Any transcriptional errors are unintentional.     Jaclyn Shaggy, MD. [facility] [phone] 03/09/2015, 10:13 AM

## 2015-03-10 ENCOUNTER — Ambulatory Visit (HOSPITAL_COMMUNITY): Admission: RE | Admit: 2015-03-10 | Payer: Medicaid Other | Source: Ambulatory Visit

## 2015-03-11 ENCOUNTER — Telehealth: Payer: Self-pay

## 2015-03-11 ENCOUNTER — Telehealth: Payer: Self-pay | Admitting: Internal Medicine

## 2015-03-11 ENCOUNTER — Other Ambulatory Visit: Payer: Self-pay | Admitting: *Deleted

## 2015-03-11 MED ORDER — GABAPENTIN 300 MG PO CAPS: ORAL_CAPSULE | ORAL | Status: DC

## 2015-03-11 NOTE — Telephone Encounter (Signed)
-----   Message from Enobong Amao, MD sent at 03/10/2015 11:37 PM EST ----- Cholesterol is mildly elevated; advise on low cholesterol diet, no initiation of medication for now. 

## 2015-03-11 NOTE — Telephone Encounter (Signed)
-----   Message from Jaclyn Shaggy, MD sent at 03/10/2015 11:37 PM EST ----- Cholesterol is mildly elevated; advise on low cholesterol diet, no initiation of medication for now.

## 2015-03-11 NOTE — Telephone Encounter (Signed)
CMA called pt, pt verified name and DOB. Pt was given lab results with no further questions. Pt verbalized that he understood with no further questions.

## 2015-03-11 NOTE — Telephone Encounter (Signed)
gabapentin (NEURONTIN) 300 MG capsule

## 2015-03-11 NOTE — Telephone Encounter (Signed)
Gabapentin has been refilled. 

## 2015-04-09 ENCOUNTER — Ambulatory Visit: Payer: Medicaid Other | Admitting: Internal Medicine

## 2015-06-03 ENCOUNTER — Other Ambulatory Visit: Payer: Self-pay | Admitting: Internal Medicine

## 2015-08-19 ENCOUNTER — Other Ambulatory Visit: Payer: Self-pay | Admitting: Internal Medicine

## 2015-08-21 ENCOUNTER — Other Ambulatory Visit: Payer: Self-pay | Admitting: Internal Medicine

## 2015-09-12 ENCOUNTER — Other Ambulatory Visit: Payer: Self-pay | Admitting: Internal Medicine

## 2015-09-15 ENCOUNTER — Other Ambulatory Visit: Payer: Self-pay | Admitting: Internal Medicine

## 2015-09-16 ENCOUNTER — Other Ambulatory Visit: Payer: Self-pay | Admitting: Internal Medicine

## 2015-09-17 ENCOUNTER — Other Ambulatory Visit: Payer: Self-pay | Admitting: Internal Medicine

## 2015-09-18 ENCOUNTER — Other Ambulatory Visit: Payer: Self-pay | Admitting: Internal Medicine

## 2015-09-19 ENCOUNTER — Other Ambulatory Visit: Payer: Self-pay | Admitting: Internal Medicine

## 2015-09-21 ENCOUNTER — Other Ambulatory Visit: Payer: Self-pay | Admitting: Internal Medicine

## 2015-09-22 ENCOUNTER — Other Ambulatory Visit: Payer: Self-pay | Admitting: Internal Medicine

## 2015-09-23 ENCOUNTER — Other Ambulatory Visit: Payer: Self-pay | Admitting: Internal Medicine

## 2015-09-24 ENCOUNTER — Other Ambulatory Visit: Payer: Self-pay | Admitting: Internal Medicine

## 2015-10-06 ENCOUNTER — Other Ambulatory Visit: Payer: Self-pay | Admitting: Internal Medicine

## 2015-10-21 ENCOUNTER — Other Ambulatory Visit: Payer: Self-pay | Admitting: Internal Medicine

## 2017-08-14 ENCOUNTER — Emergency Department (HOSPITAL_COMMUNITY)
Admission: EM | Admit: 2017-08-14 | Discharge: 2017-08-14 | Disposition: A | Discharge status: Home / Self Care | Payer: Medicaid Other | Attending: Emergency Medicine | Admitting: Emergency Medicine

## 2017-08-14 ENCOUNTER — Emergency Department (HOSPITAL_COMMUNITY): Payer: Medicaid Other

## 2017-08-14 ENCOUNTER — Encounter (HOSPITAL_COMMUNITY): Payer: Self-pay | Admitting: Emergency Medicine

## 2017-08-14 DIAGNOSIS — Y999 Unspecified external cause status: Secondary | ICD-10-CM | POA: Insufficient documentation

## 2017-08-14 DIAGNOSIS — S0990XA Unspecified injury of head, initial encounter: Secondary | ICD-10-CM | POA: Insufficient documentation

## 2017-08-14 DIAGNOSIS — Y92012 Bathroom of single-family (private) house as the place of occurrence of the external cause: Secondary | ICD-10-CM | POA: Insufficient documentation

## 2017-08-14 DIAGNOSIS — G35 Multiple sclerosis: Secondary | ICD-10-CM | POA: Insufficient documentation

## 2017-08-14 DIAGNOSIS — Y93E8 Activity, other personal hygiene: Secondary | ICD-10-CM | POA: Diagnosis not present

## 2017-08-14 DIAGNOSIS — Z79899 Other long term (current) drug therapy: Secondary | ICD-10-CM | POA: Insufficient documentation

## 2017-08-14 DIAGNOSIS — F17210 Nicotine dependence, cigarettes, uncomplicated: Secondary | ICD-10-CM | POA: Insufficient documentation

## 2017-08-14 DIAGNOSIS — R55 Syncope and collapse: Secondary | ICD-10-CM | POA: Diagnosis present

## 2017-08-14 DIAGNOSIS — W19XXXA Unspecified fall, initial encounter: Secondary | ICD-10-CM | POA: Diagnosis not present

## 2017-08-14 LAB — CBC
HEMATOCRIT: 42.4 % (ref 39.0–52.0)
Hemoglobin: 14.4 g/dL (ref 13.0–17.0)
MCH: 31.9 pg (ref 26.0–34.0)
MCHC: 34 g/dL (ref 30.0–36.0)
MCV: 94 fL (ref 78.0–100.0)
Platelets: 283 10*3/uL (ref 150–400)
RBC: 4.51 MIL/uL (ref 4.22–5.81)
RDW: 12.3 % (ref 11.5–15.5)
WBC: 8.3 10*3/uL (ref 4.0–10.5)

## 2017-08-14 LAB — URINALYSIS, ROUTINE W REFLEX MICROSCOPIC
BACTERIA UA: NONE SEEN
Bilirubin Urine: NEGATIVE
Glucose, UA: NEGATIVE mg/dL
Ketones, ur: NEGATIVE mg/dL
Leukocytes, UA: NEGATIVE
NITRITE: NEGATIVE
Protein, ur: NEGATIVE mg/dL
SPECIFIC GRAVITY, URINE: 1.026 (ref 1.005–1.030)
pH: 5 (ref 5.0–8.0)

## 2017-08-14 LAB — BASIC METABOLIC PANEL
Anion gap: 10 (ref 5–15)
BUN: 16 mg/dL (ref 6–20)
CALCIUM: 8.9 mg/dL (ref 8.9–10.3)
CO2: 18 mmol/L — ABNORMAL LOW (ref 22–32)
Chloride: 112 mmol/L — ABNORMAL HIGH (ref 101–111)
Creatinine, Ser: 1.2 mg/dL (ref 0.61–1.24)
GFR calc Af Amer: >60 mL/min (ref >60)
GLUCOSE: 105 mg/dL — AB (ref 65–99)
POTASSIUM: 3.6 mmol/L (ref 3.5–5.1)
Sodium: 140 mmol/L (ref 135–145)

## 2017-08-14 LAB — I-STAT TROPONIN, ED
TROPONIN I, POC: 0 ng/mL (ref 0.00–0.08)
TROPONIN I, POC: 0 ng/mL (ref 0.00–0.08)

## 2017-08-14 NOTE — ED Notes (Signed)
Patient reports that he is calling ride and will not answer.

## 2017-08-14 NOTE — ED Notes (Signed)
[facility] can be called but patient has no way of getting in the house. No key available.

## 2017-08-14 NOTE — ED Triage Notes (Signed)
Brought in by EMS from home , c/o syncope episode. Per patient he fell on his wheelchair trying to use toilet and found by his wife unconscious  in the floor. Per Patient he woke up front face in the floor. Pt has hx of MS and stroke(right side deficit). Denies pain.

## 2017-08-14 NOTE — Discharge Instructions (Addendum)
Workup today is very reassuring, imaging and labs look good, unlikely that any cardiac issue caused her fall today.  I feel this likely happened due to taking increasing amounts of Topamax since this is a new medication for you.  Please ensure you are only taking 2 pills twice a day, and continue taking your Cymbalta as directed.  Please call to schedule follow-up appointment with your neurologist and chronic pain management doctor.  Return to the emergency department for any new neurologic symptoms, chest pain, shortness of breath, recurrent passing out or falls or any other new or concerning symptoms.

## 2017-08-14 NOTE — ED Provider Notes (Signed)
[facility]-EMERGENCY DEPT Provider Note   CSN: [id number] Arrival date & time: 08/14/17  0235     History   Chief Complaint Chief Complaint  Patient presents with  . Loss of Consciousness    HPI Bobby Stafford is a 42 y.o. male.  Bobby Stafford is a 42 y.o. Male with history of multiple sclerosis with chronic right-sided deficits, presents to the ED for evaluation after possible syncope and fall.  Patient reports he got up and mobile, and when transferring from his wheelchair to the toilet he "blacked out" and fell to the floor.  Patient reports he woke up face down on the floor where his wife found him.  Unsure how long he was on the floor.  Patient denies any preceding chest pain or shortness of breath, no headache or vision changes.  Patient denies any abdominal pain, nausea, vomiting or diarrhea no fevers or chills no urinary symptoms.  Patient reports he was recently seen by his chronic pain management doctor, who took him off of and start him on Topamax and Cymbalta.  Patient thinks he has been overmedicating herself as he was confused about Topamax being, I will review patient's medication bottles he is supposed to be taking 100 mg twice daily, but reports, and has been feeling increased dizziness and "out of his head".  Patient denies any new neurologic deficits.  Denies neck or back pain.     Past Medical History:  Diagnosis Date  . Arthritis   . MS (multiple sclerosis) [facility])     Patient Active Problem List   Diagnosis Date Noted  . Morbid obesity (HCC) 03/09/2015  . Impotence 04/10/2014  . Generalized pain 04/10/2014  . Multiple sclerosis (HCC) 04/10/2014  . MS (multiple sclerosis) (HCC) 07/09/2013  . Numbness on right side 07/04/2013  . Nystagmus 07/04/2013  . Weakness of right side of body 07/04/2013    Past Surgical History:  Procedure Laterality Date  . no surgerys          Home Medications    Prior to Admission medications     Medication Sig Start Date End Date Taking? Authorizing Provider  baclofen (LIORESAL) 10 MG tablet Take 1 tablet (10 mg total) by mouth 3 (three) times daily. 06/18/14  Yes Quentin Angst, MD  dalfampridine (AMPYRA) 10 MG TB12 Take 10 mg by mouth daily.   Yes [provider]  DULoxetine (CYMBALTA) 30 MG capsule Take 30 mg by mouth daily. 07/28/17  Yes [provider]  gabapentin (NEURONTIN) 300 MG capsule Take 3 capsules (900 mg total) by mouth 3 (three) times daily. Patient needs office visit for refills 08/19/15  Yes Quentin Angst, MD  ibuprofen (ADVIL,MOTRIN) 600 MG tablet Take 600 mg by mouth every 6 hours as needed for pain 12/08/14  Yes [provider]  naproxen sodium (ANAPROX) 220 MG tablet Take 220 mg by mouth 2 (two) times daily as needed (pain).    Yes [provider]  NON FORMULARY Take 1,500 mg by mouth daily.   Yes [provider]  ocrelizumab (OCREVUS) 300 MG/10ML injection Inject 20 mLs into the vein every 6 (six) months. 07/21/16  Yes [provider]  oxyCODONE-acetaminophen (PERCOCET) 10-325 MG per tablet Take 1 tablet by mouth every 4 (four) hours as needed for pain.   Yes [provider]  tadalafil (CIALIS) 20 MG tablet Take 0.5 tablets (10 mg total) by mouth every other day as needed for erectile dysfunction. 03/09/15  Yes Newlin, Enobong,  MD  topiramate (TOPAMAX) 50 MG tablet Take 100 mg by mouth 2 (two) times daily. 07/28/17  Yes [provider]  Vitamin D, Ergocalciferol, (DRISDOL) 50000 UNITS CAPS capsule Take 50,000 Units by mouth every 7 (seven) days.  02/19/15  Yes [provider]  diclofenac (FLECTOR) 1.3 % PTCH Place 1 patch onto the skin 2 (two) times daily. Patient not taking: Reported on 11/06/2014 10/29/13   Ambrose Finland, NP  Dimethyl Fumarate (TECFIDERA) 240 MG CPDR Take 1 capsule (240 mg total) by mouth every morning. Patient not taking: Reported on 08/19/2014 04/10/14   Quentin Angst, MD  LORazepam (ATIVAN) 1 MG tablet Take 1 tablet (1 mg total) by mouth every 8 (eight) hours as needed (spasms). Patient not taking: Reported on 04/10/2014 07/09/13   Dorothea Ogle, MD  traMADol (ULTRAM) 50 MG tablet Take 1 tablet (50 mg total) by mouth every 8 (eight) hours as needed. Patient not taking: Reported on 08/19/2014 04/10/14   Quentin Angst, MD    Family History Family History  Problem Relation Age of Onset  . Diabetes type I Father     Social History Social History   Tobacco Use  . Smoking status: Current Every Day Smoker    Packs/day: 0.50    Years: 24.00    Pack years: 12.00    Types: Cigarettes  . Smokeless tobacco: Never Used  . Tobacco comment: patient is aware that smoking is not good for him   Substance Use Topics  . Alcohol use: No    Alcohol/week: 12.0 oz    Types: 20 Cans of beer per week  . Drug use: Yes    Frequency: 7.0 times per week    Types: Marijuana     Allergies   Patient has no known allergies.   Review of Systems Review of Systems  Constitutional: Negative for chills and fever.  HENT: Negative for congestion, rhinorrhea and sore throat.   Eyes: Negative for visual disturbance.  Respiratory: Negative for cough, chest tightness and shortness of breath.   Cardiovascular: Negative for chest pain, palpitations and leg swelling.  Gastrointestinal: Negative for abdominal pain, diarrhea, nausea and vomiting.  Genitourinary: Negative for dysuria and frequency.  Musculoskeletal: Negative for arthralgias, back pain and neck pain.  Skin: Negative for color change and rash.  Neurological: Positive for dizziness. Negative for facial asymmetry, weakness, light-headedness, numbness and headaches.     Physical Exam Updated Vital Signs BP 119/63   Pulse 72   Resp 18   SpO2 98%   Physical Exam  Constitutional: He appears well-developed and well-nourished. No distress.  Patient sitting comfortably and in no acute distress   HENT:  Head: Normocephalic and atraumatic.  Mouth/Throat: Oropharynx is clear and moist.  No facial or scalp tenderness noted no palpable hematoma, step-off or deformity, bilateral TMs without evidence of TM or CSF otorrhea, no battle sign.  Eyes: Pupils are equal, round, and reactive to light. EOM are normal. Right eye exhibits no discharge. Left eye exhibits no discharge.  No nystagmus  Neck:  C-spine nontender to palpation at midline bilaterally, normal range of motion of the left in all directions.  Cardiovascular: Normal rate, regular rhythm, normal heart sounds and intact distal pulses.  Pulmonary/Chest: Effort normal. No stridor. No respiratory distress. He has no wheezes. He has no rales.  Respirations equal and unlabored, patient able to speak in full sentences, lungs clear to auscultation bilaterally, chest nontender to palpation throughout.  Abdominal: Soft. Bowel sounds  are normal. He exhibits no distension and no mass. There is no tenderness. There is no guarding.  Abdomen soft, nontender to palpation in all quadrants, no peritoneal signs  Musculoskeletal: He exhibits no edema or deformity.  T-spine and L-spine nontender to palpation at midline.  All joints supple and easily movable, all compartments soft.  Neurological: He is alert. Coordination normal.  Speech is clear, able to follow commands CN III-XII intact Normal strength in the left upper and lower extremities, chronic right-sided deficit of the upper and lower extremity Sensation normal to light and sharp touch Moves left extremities without ataxia, coordination intact  Skin: Skin is warm and dry. Capillary refill takes less than 2 seconds. He is not diaphoretic.  Psychiatric: He has a normal mood and affect. His behavior is normal.  Nursing note and vitals reviewed.    ED Treatments / Results  Labs (all labs ordered are listed, but only abnormal results are displayed) Labs Reviewed  BASIC METABOLIC PANEL -  Abnormal; Notable for the following components:      Result Value   Chloride 112 (*)    CO2 18 (*)    Glucose, Bld 105 (*)    All other components within normal limits  URINALYSIS, ROUTINE W REFLEX MICROSCOPIC - Abnormal; Notable for the following components:   Hgb urine dipstick SMALL (*)    Squamous Epithelial / LPF 0-5 (*)    All other components within normal limits  CBC  CBG MONITORING, ED  I-STAT TROPONIN, ED  I-STAT TROPONIN, ED    EKG EKG Interpretation  Date/Time:  Monday August 14 2017 04:23:18 EDT Ventricular Rate:  76 PR Interval:    QRS Duration: 97 QT Interval:  388 QTC Calculation: 437 R Axis:   16 Text Interpretation:  Sinus rhythm Borderline prolonged PR interval Confirmed by Palumbo, April ([id number]) on 08/14/2017 6:05:12 AM Also confirmed by Nicanor Alcon, April ([id number]), editor Sheppard Evens [id number])  on 08/14/2017 8:09:22 AM   Radiology Dg Chest 2 View  Result Date: 08/14/2017 CLINICAL DATA:  Acute onset of syncope. EXAM: CHEST - 2 VIEW COMPARISON:  Chest radiograph performed 05/04/2013 FINDINGS: The lungs are well-aerated and clear. There is no evidence of focal opacification, pleural effusion or pneumothorax. The heart is normal in size; the mediastinal contour is within normal limits. No acute osseous abnormalities are seen. IMPRESSION: No acute cardiopulmonary process seen. Electronically Signed   By: Roanna Raider M.D.   On: 08/14/2017 05:20   Ct Head Wo Contrast  Result Date: 08/14/2017 CLINICAL DATA:  Acute onset of syncope and fall. Found face down on floor. EXAM: CT HEAD WITHOUT CONTRAST TECHNIQUE: Contiguous axial images were obtained from the base of the skull through the vertex without intravenous contrast. COMPARISON:  MRI of the brain performed 01/17/2014, and CT of the head performed 07/03/2013 FINDINGS: Brain: No evidence of acute infarction, hemorrhage, hydrocephalus, extra-axial collection or mass lesion/mass effect. The posterior fossa, including the  cerebellum, brainstem and fourth ventricle, is within normal limits. The third and lateral ventricles, and basal ganglia are unremarkable in appearance. The cerebral hemispheres are symmetric in appearance, with normal gray-white differentiation. No mass effect or midline shift is seen. Vascular: No hyperdense vessel or unexpected calcification. Skull: There is no evidence of fracture; visualized osseous structures are unremarkable in appearance. Sinuses/Orbits: The orbits are within normal limits. The paranasal sinuses and mastoid air cells are well-aerated. Other: No significant soft tissue abnormalities are seen. IMPRESSION: No evidence of traumatic intracranial injury or fracture. Electronically Signed  By: Roanna Raider M.D.   On: 08/14/2017 05:22    Procedures Procedures (including critical care time)  Medications Ordered in ED Medications - No data to display   Initial Impression / Assessment and Plan / ED Course  I have reviewed the triage vital signs and the nursing notes.  Pertinent labs & imaging results that were available during my care of the patient were reviewed by me and considered in my medical decision making (see chart for details).  Patient presents to the ED for evaluation after a fall, possible syncopal episode.  Patient was transferring from his wheelchair to the toilet, patient is chronically wheelchair-bound due to right-sided deficits from his MS.  Patient denies any new neurologic deficits, no chest pain or shortness of breath, no abdominal pain.  No fevers or chills, nausea or vomiting, diarrhea.  On initial exam vitals are normal and patient is overall well-appearing and in no acute distress.  Aside from chronic right-sided deficits normal neurologic exam, lungs clear heart regular rhythm, benign abdomen.  Will get labs, troponin, EKG, chest x-ray and head CT.  I suspect falls related to patient taking an appropriate amount of his Topamax due to confusion surrounding  dosing.  Patient last week he has been taking Topamax 3 times daily rather than twice daily and this could certainly be causing his dizziness and fall.  Labs and imaging overall reassuring.  EKG without ischemic changes, 2- troponins over 3 hours.  No acute electrolyte derangements requiring intervention here in the ED.  No leukocytosis, normal hemoglobin.  Normal renal function.  UA without any evidence of infection.  CBG normal.  Chest x-ray shows no active cardiopulmonary disease.  CT head without any evidence of acute fracture or intracranial injury.  Discussed these reassuring results with the patient, he is not experiencing any symptoms at this time.  Discussed appropriate medication regimen and dosing for his Topamax and Cymbalta I feel this is likely what is been contributing, given reassuring workup no concern for cardiac etiology.  At this time patient is stable for discharge home we will have him follow-up with his pain management doctor and neurologist.  Strict return precautions.  Patient expresses understanding and is in agreement with plan.  Patient discussed with Dr. Nicanor Alcon who is in agreement with plan.  Final Clinical Impressions(s) / ED Diagnoses   Final diagnoses:  Fall, initial encounter  Syncope, unspecified syncope type    ED Discharge Orders    None       Legrand Rams 08/14/17 1818    Palumbo, April, MD 08/14/17 2307

## 2019-03-11 IMAGING — CT CT HEAD W/O CM
3 series · 15 of 47 positions shown, 18 images · non-contrast
Comparison: MRI of the brain performed 01/17/2014, and CT of the
head performed 07/03/2013

CLINICAL DATA: Acute onset of syncope and fall. Found face down on
floor.

EXAM:
CT HEAD WITHOUT CONTRAST
TECHNIQUE: Contiguous axial images were obtained from the base of the skull
through the vertex without intravenous contrast.

[Series 2: head wo · axial · 0.47mm/px · z∈[-308,-173]mm · 9 of 33 slices shown, 12 images]
[im 3/33  brain]
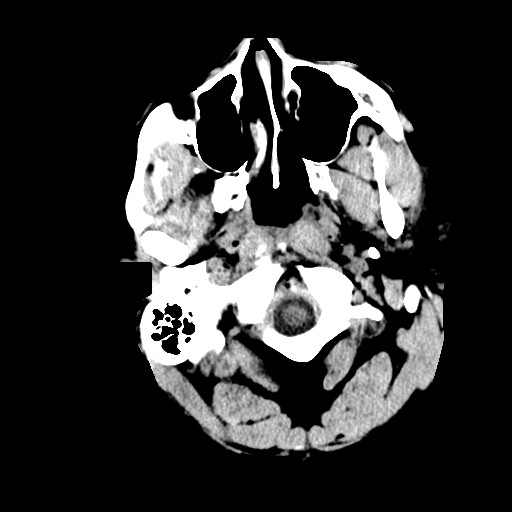
[im 3/33  bone]
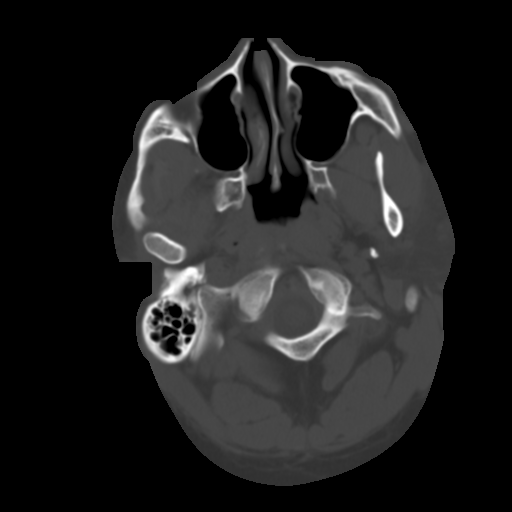
[im 6/33  brain]
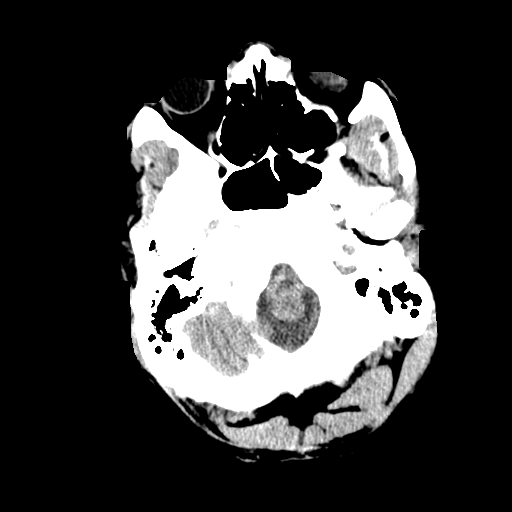
[im 9/33  brain]
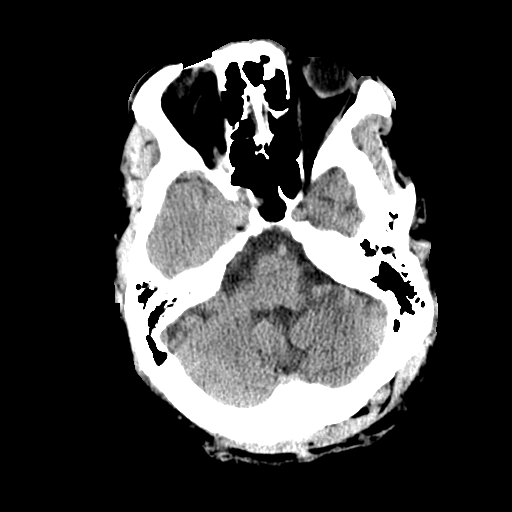
[im 13/33  brain]
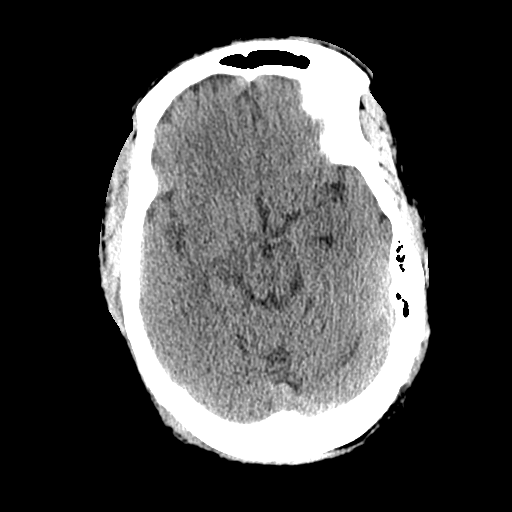
[im 17/33  brain]
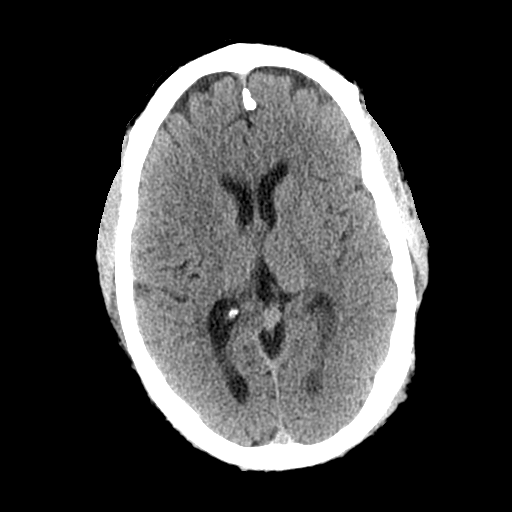
[im 17/33  bone]
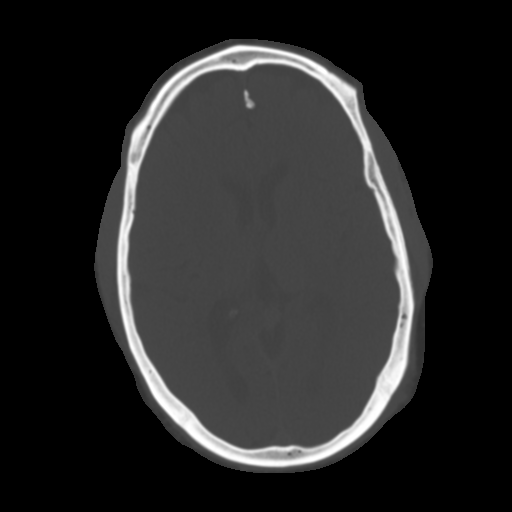
[im 20/33  brain]
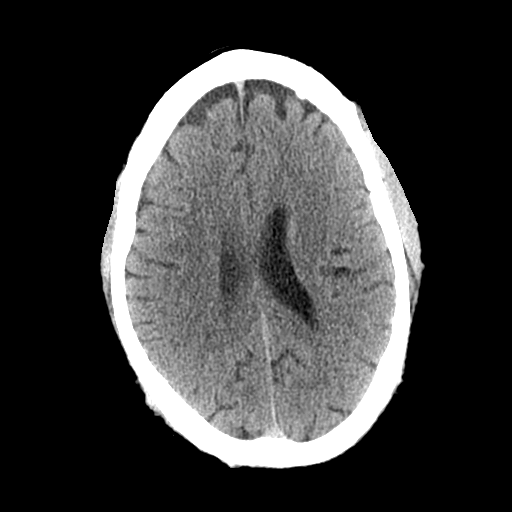
[im 24/33  brain]
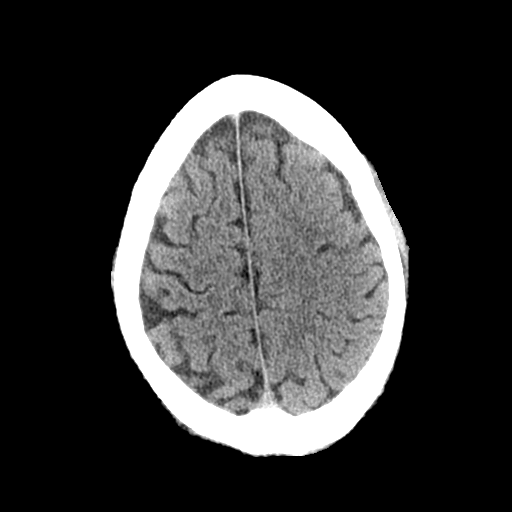
[im 27/33  brain]
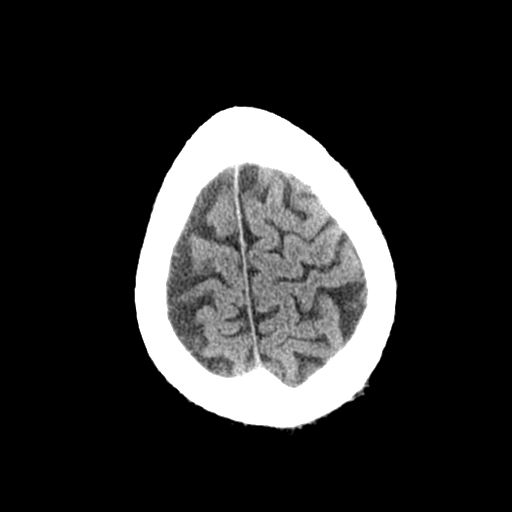
[im 30/33  brain]
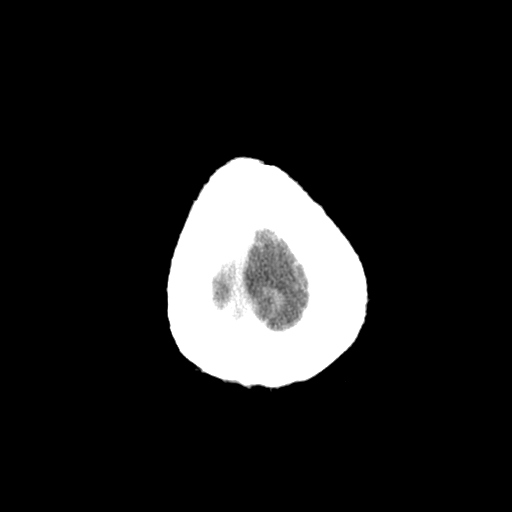
[im 30/33  bone]
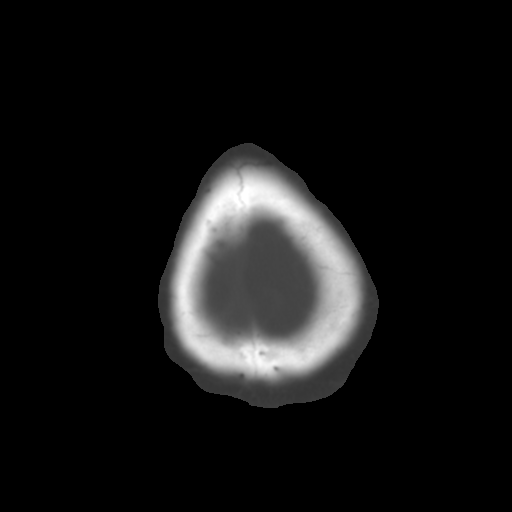

[Series 4: coronal soft tissue · coronal · 0.30mm/px · 3 of 69 slices shown]
[im 23/69  brain]
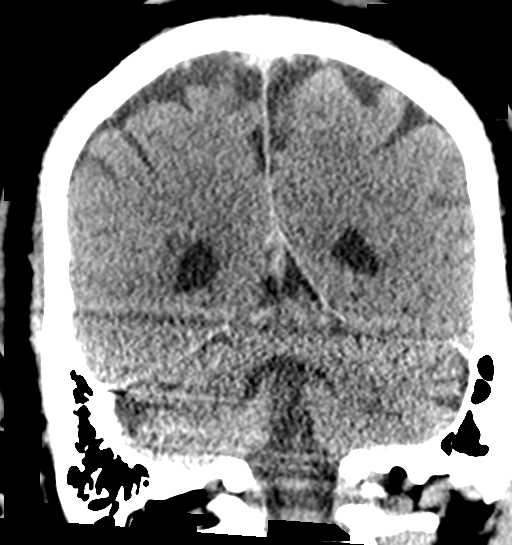
[im 31/69  brain]
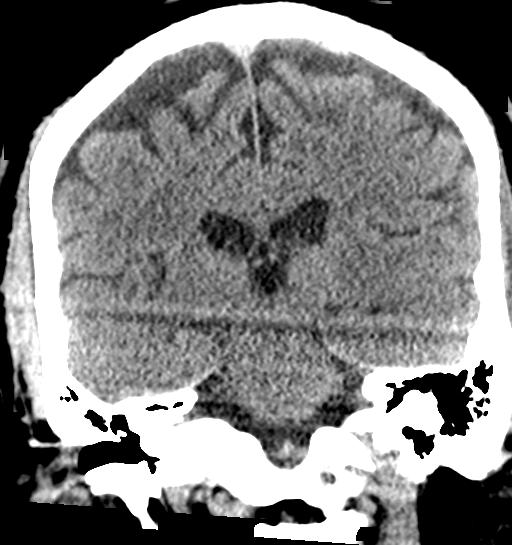
[im 38/69  brain]
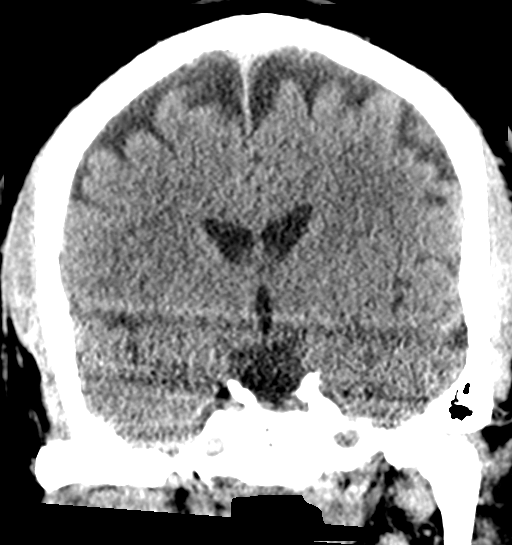

[Series 5: sagittal soft tissue · sagittal · 0.32mm/px · 3 of 53 slices shown]
[im 18/53  brain]
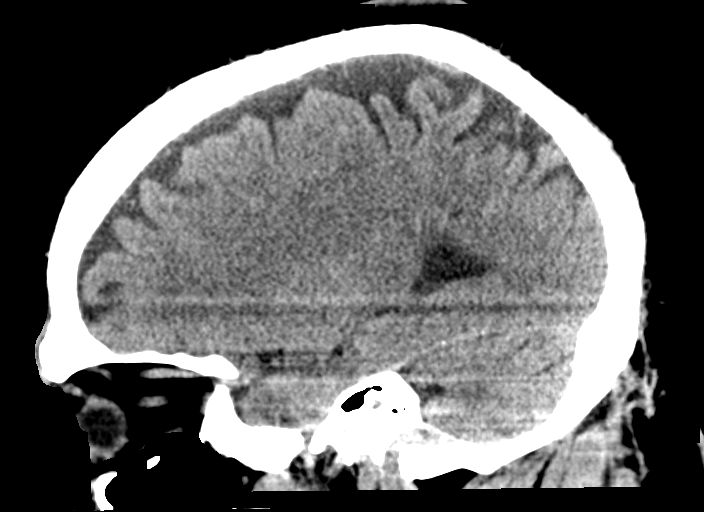
[im 27/53  brain]
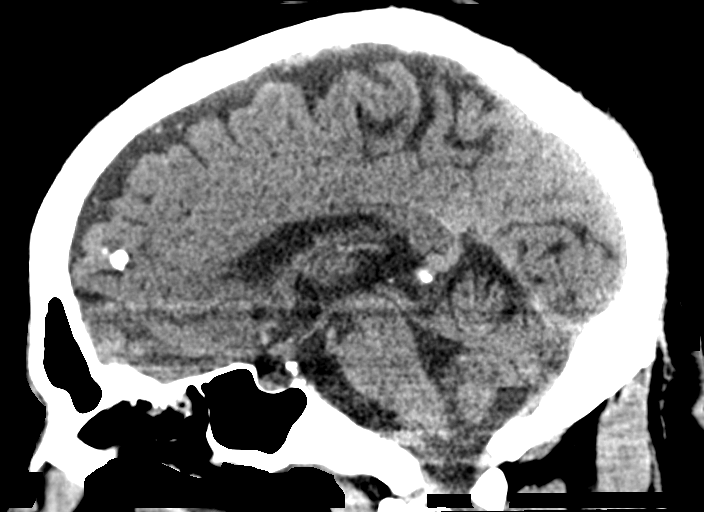
[im 35/53  brain]
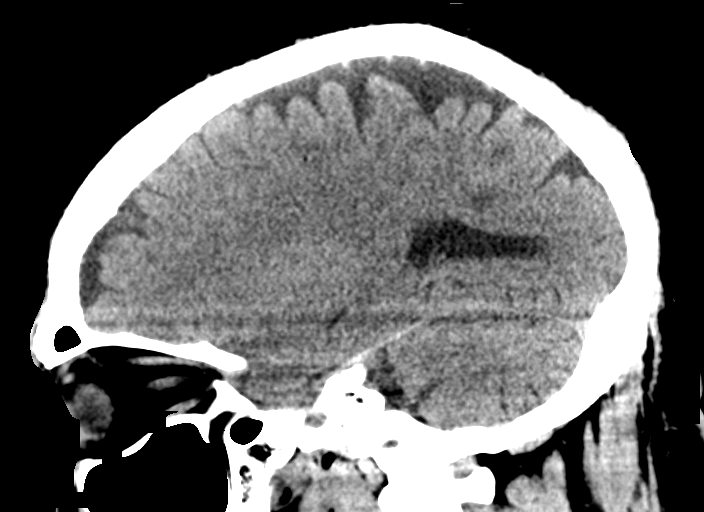

[15 of 47 positions shown; findings below may reference images not displayed]

FINDINGS: Brain: No evidence of acute infarction, hemorrhage, hydrocephalus,
extra-axial collection or mass lesion/mass effect.

The posterior fossa, including the cerebellum, brainstem and fourth
ventricle, is within normal limits. The third and lateral
ventricles, and basal ganglia are unremarkable in appearance. The
cerebral hemispheres are symmetric in appearance, with normal
gray-white differentiation. No mass effect or midline shift is seen.

Vascular: No hyperdense vessel or unexpected calcification.

Skull: There is no evidence of fracture; visualized osseous
structures are unremarkable in appearance.

Sinuses/Orbits: The orbits are within normal limits. The paranasal
sinuses and mastoid air cells are well-aerated.

Other: No significant soft tissue abnormalities are seen.
IMPRESSION: No evidence of traumatic intracranial injury or fracture.

## 2022-09-22 DIAGNOSIS — G35 Multiple sclerosis: Secondary | ICD-10-CM | POA: Diagnosis not present

## 2022-09-29 DIAGNOSIS — G35 Multiple sclerosis: Secondary | ICD-10-CM | POA: Diagnosis not present

## 2022-09-29 DIAGNOSIS — E785 Hyperlipidemia, unspecified: Secondary | ICD-10-CM | POA: Diagnosis not present

## 2022-09-29 DIAGNOSIS — R532 Functional quadriplegia: Secondary | ICD-10-CM | POA: Diagnosis not present

## 2022-09-29 DIAGNOSIS — F329 Major depressive disorder, single episode, unspecified: Secondary | ICD-10-CM | POA: Diagnosis not present

## 2022-09-29 DIAGNOSIS — E559 Vitamin D deficiency, unspecified: Secondary | ICD-10-CM | POA: Diagnosis not present

## 2022-10-17 DIAGNOSIS — E785 Hyperlipidemia, unspecified: Secondary | ICD-10-CM | POA: Diagnosis not present

## 2022-10-17 DIAGNOSIS — G35 Multiple sclerosis: Secondary | ICD-10-CM | POA: Diagnosis not present

## 2022-10-17 DIAGNOSIS — G8929 Other chronic pain: Secondary | ICD-10-CM | POA: Diagnosis not present

## 2022-10-17 DIAGNOSIS — R7989 Other specified abnormal findings of blood chemistry: Secondary | ICD-10-CM | POA: Diagnosis not present

## 2022-10-17 DIAGNOSIS — E559 Vitamin D deficiency, unspecified: Secondary | ICD-10-CM | POA: Diagnosis not present

## 2022-10-17 DIAGNOSIS — F329 Major depressive disorder, single episode, unspecified: Secondary | ICD-10-CM | POA: Diagnosis not present

## 2022-10-17 DIAGNOSIS — R532 Functional quadriplegia: Secondary | ICD-10-CM | POA: Diagnosis not present

## 2022-11-02 DIAGNOSIS — G8929 Other chronic pain: Secondary | ICD-10-CM | POA: Diagnosis not present

## 2022-11-02 DIAGNOSIS — G35 Multiple sclerosis: Secondary | ICD-10-CM | POA: Diagnosis not present

## 2022-11-02 DIAGNOSIS — R7989 Other specified abnormal findings of blood chemistry: Secondary | ICD-10-CM | POA: Diagnosis not present

## 2022-11-02 DIAGNOSIS — R532 Functional quadriplegia: Secondary | ICD-10-CM | POA: Diagnosis not present

## 2022-11-02 DIAGNOSIS — E559 Vitamin D deficiency, unspecified: Secondary | ICD-10-CM | POA: Diagnosis not present

## 2022-11-02 DIAGNOSIS — E785 Hyperlipidemia, unspecified: Secondary | ICD-10-CM | POA: Diagnosis not present

## 2022-11-21 DIAGNOSIS — G35 Multiple sclerosis: Secondary | ICD-10-CM | POA: Diagnosis not present

## 2022-11-21 DIAGNOSIS — E785 Hyperlipidemia, unspecified: Secondary | ICD-10-CM | POA: Diagnosis not present

## 2022-11-21 DIAGNOSIS — F329 Major depressive disorder, single episode, unspecified: Secondary | ICD-10-CM | POA: Diagnosis not present

## 2022-11-21 DIAGNOSIS — G8929 Other chronic pain: Secondary | ICD-10-CM | POA: Diagnosis not present

## 2022-11-21 DIAGNOSIS — E559 Vitamin D deficiency, unspecified: Secondary | ICD-10-CM | POA: Diagnosis not present

## 2022-12-14 DIAGNOSIS — F331 Major depressive disorder, recurrent, moderate: Secondary | ICD-10-CM | POA: Diagnosis not present

## 2022-12-14 DIAGNOSIS — F4321 Adjustment disorder with depressed mood: Secondary | ICD-10-CM | POA: Diagnosis not present

## 2022-12-15 DIAGNOSIS — F329 Major depressive disorder, single episode, unspecified: Secondary | ICD-10-CM | POA: Diagnosis not present

## 2022-12-15 DIAGNOSIS — G8929 Other chronic pain: Secondary | ICD-10-CM | POA: Diagnosis not present

## 2022-12-15 DIAGNOSIS — G35 Multiple sclerosis: Secondary | ICD-10-CM | POA: Diagnosis not present

## 2022-12-15 DIAGNOSIS — E559 Vitamin D deficiency, unspecified: Secondary | ICD-10-CM | POA: Diagnosis not present

## 2022-12-15 DIAGNOSIS — E785 Hyperlipidemia, unspecified: Secondary | ICD-10-CM | POA: Diagnosis not present

## 2022-12-19 DIAGNOSIS — F329 Major depressive disorder, single episode, unspecified: Secondary | ICD-10-CM | POA: Diagnosis not present

## 2022-12-19 DIAGNOSIS — G8929 Other chronic pain: Secondary | ICD-10-CM | POA: Diagnosis not present

## 2022-12-19 DIAGNOSIS — R7989 Other specified abnormal findings of blood chemistry: Secondary | ICD-10-CM | POA: Diagnosis not present

## 2022-12-19 DIAGNOSIS — R532 Functional quadriplegia: Secondary | ICD-10-CM | POA: Diagnosis not present

## 2022-12-19 DIAGNOSIS — G35 Multiple sclerosis: Secondary | ICD-10-CM | POA: Diagnosis not present

## 2022-12-19 DIAGNOSIS — E559 Vitamin D deficiency, unspecified: Secondary | ICD-10-CM | POA: Diagnosis not present

## 2022-12-19 DIAGNOSIS — E785 Hyperlipidemia, unspecified: Secondary | ICD-10-CM | POA: Diagnosis not present

## 2023-01-03 DIAGNOSIS — F4321 Adjustment disorder with depressed mood: Secondary | ICD-10-CM | POA: Diagnosis not present

## 2023-01-03 DIAGNOSIS — F331 Major depressive disorder, recurrent, moderate: Secondary | ICD-10-CM | POA: Diagnosis not present

## 2023-01-11 DIAGNOSIS — M25512 Pain in left shoulder: Secondary | ICD-10-CM | POA: Diagnosis not present

## 2023-01-16 DIAGNOSIS — F329 Major depressive disorder, single episode, unspecified: Secondary | ICD-10-CM | POA: Diagnosis not present

## 2023-01-16 DIAGNOSIS — E785 Hyperlipidemia, unspecified: Secondary | ICD-10-CM | POA: Diagnosis not present

## 2023-01-16 DIAGNOSIS — M25512 Pain in left shoulder: Secondary | ICD-10-CM | POA: Diagnosis not present

## 2023-01-16 DIAGNOSIS — R532 Functional quadriplegia: Secondary | ICD-10-CM | POA: Diagnosis not present

## 2023-01-16 DIAGNOSIS — G35 Multiple sclerosis: Secondary | ICD-10-CM | POA: Diagnosis not present

## 2023-01-19 DIAGNOSIS — M25512 Pain in left shoulder: Secondary | ICD-10-CM | POA: Diagnosis not present

## 2023-01-19 DIAGNOSIS — R532 Functional quadriplegia: Secondary | ICD-10-CM | POA: Diagnosis not present

## 2023-01-19 DIAGNOSIS — E559 Vitamin D deficiency, unspecified: Secondary | ICD-10-CM | POA: Diagnosis not present

## 2023-01-19 DIAGNOSIS — E785 Hyperlipidemia, unspecified: Secondary | ICD-10-CM | POA: Diagnosis not present

## 2023-01-19 DIAGNOSIS — G35 Multiple sclerosis: Secondary | ICD-10-CM | POA: Diagnosis not present

## 2023-01-31 DIAGNOSIS — F331 Major depressive disorder, recurrent, moderate: Secondary | ICD-10-CM | POA: Diagnosis not present

## 2023-01-31 DIAGNOSIS — F4321 Adjustment disorder with depressed mood: Secondary | ICD-10-CM | POA: Diagnosis not present

## 2023-03-14 DIAGNOSIS — F331 Major depressive disorder, recurrent, moderate: Secondary | ICD-10-CM | POA: Diagnosis not present

## 2023-03-14 DIAGNOSIS — F4321 Adjustment disorder with depressed mood: Secondary | ICD-10-CM | POA: Diagnosis not present

## 2023-03-21 DIAGNOSIS — G35 Multiple sclerosis: Secondary | ICD-10-CM | POA: Diagnosis not present

## 2023-03-27 DIAGNOSIS — G35 Multiple sclerosis: Secondary | ICD-10-CM | POA: Diagnosis not present

## 2024-04-30 ENCOUNTER — Emergency Department (HOSPITAL_COMMUNITY)

## 2024-04-30 ENCOUNTER — Other Ambulatory Visit: Payer: Self-pay

## 2024-04-30 ENCOUNTER — Encounter (HOSPITAL_COMMUNITY): Payer: Self-pay

## 2024-04-30 ENCOUNTER — Inpatient Hospital Stay (HOSPITAL_COMMUNITY)
Admission: EM | Admit: 2024-04-30 | Discharge: 2024-05-04 | DRG: 418 | Disposition: A | Discharge status: Skilled Nursing Facility | Source: Skilled Nursing Facility | Attending: Internal Medicine | Admitting: Internal Medicine

## 2024-04-30 DIAGNOSIS — Z888 Allergy status to other drugs, medicaments and biological substances status: Secondary | ICD-10-CM

## 2024-04-30 DIAGNOSIS — G8220 Paraplegia, unspecified: Secondary | ICD-10-CM | POA: Diagnosis present

## 2024-04-30 DIAGNOSIS — K8020 Calculus of gallbladder without cholecystitis without obstruction: Secondary | ICD-10-CM

## 2024-04-30 DIAGNOSIS — R932 Abnormal findings on diagnostic imaging of liver and biliary tract: Secondary | ICD-10-CM | POA: Diagnosis not present

## 2024-04-30 DIAGNOSIS — F17210 Nicotine dependence, cigarettes, uncomplicated: Secondary | ICD-10-CM | POA: Diagnosis present

## 2024-04-30 DIAGNOSIS — Z79899 Other long term (current) drug therapy: Secondary | ICD-10-CM

## 2024-04-30 DIAGNOSIS — E785 Hyperlipidemia, unspecified: Secondary | ICD-10-CM | POA: Diagnosis present

## 2024-04-30 DIAGNOSIS — G35D Multiple sclerosis, unspecified: Secondary | ICD-10-CM | POA: Diagnosis present

## 2024-04-30 DIAGNOSIS — Z7952 Long term (current) use of systemic steroids: Secondary | ICD-10-CM

## 2024-04-30 DIAGNOSIS — Z833 Family history of diabetes mellitus: Secondary | ICD-10-CM | POA: Diagnosis not present

## 2024-04-30 DIAGNOSIS — I10 Essential (primary) hypertension: Secondary | ICD-10-CM | POA: Diagnosis present

## 2024-04-30 DIAGNOSIS — Z791 Long term (current) use of non-steroidal anti-inflammatories (NSAID): Secondary | ICD-10-CM

## 2024-04-30 DIAGNOSIS — D72829 Elevated white blood cell count, unspecified: Secondary | ICD-10-CM | POA: Diagnosis not present

## 2024-04-30 DIAGNOSIS — R1011 Right upper quadrant pain: Principal | ICD-10-CM

## 2024-04-30 DIAGNOSIS — I700 Atherosclerosis of aorta: Secondary | ICD-10-CM | POA: Diagnosis present

## 2024-04-30 DIAGNOSIS — R748 Abnormal levels of other serum enzymes: Secondary | ICD-10-CM | POA: Diagnosis present

## 2024-04-30 DIAGNOSIS — Z7982 Long term (current) use of aspirin: Secondary | ICD-10-CM

## 2024-04-30 DIAGNOSIS — K8000 Calculus of gallbladder with acute cholecystitis without obstruction: Secondary | ICD-10-CM | POA: Diagnosis present

## 2024-04-30 DIAGNOSIS — Z993 Dependence on wheelchair: Secondary | ICD-10-CM

## 2024-04-30 DIAGNOSIS — R7989 Other specified abnormal findings of blood chemistry: Secondary | ICD-10-CM | POA: Diagnosis not present

## 2024-04-30 DIAGNOSIS — M1990 Unspecified osteoarthritis, unspecified site: Secondary | ICD-10-CM | POA: Diagnosis present

## 2024-04-30 DIAGNOSIS — R7401 Elevation of levels of liver transaminase levels: Secondary | ICD-10-CM

## 2024-04-30 DIAGNOSIS — R52 Pain, unspecified: Secondary | ICD-10-CM

## 2024-04-30 DIAGNOSIS — R17 Unspecified jaundice: Secondary | ICD-10-CM | POA: Diagnosis not present

## 2024-04-30 LAB — ACETAMINOPHEN LEVEL: Acetaminophen (Tylenol), Serum: <10 ug/mL — ABNORMAL LOW (ref 10–30)

## 2024-04-30 LAB — COMPREHENSIVE METABOLIC PANEL WITH GFR
ALT: 654 U/L — ABNORMAL HIGH (ref 0–44)
AST: 336 U/L — ABNORMAL HIGH (ref 15–41)
Albumin: 4.1 g/dL (ref 3.5–5.0)
Alkaline Phosphatase: 220 U/L — ABNORMAL HIGH (ref 38–126)
Anion gap: 10 (ref 5–15)
BUN: 16 mg/dL (ref 6–20)
CO2: 22 mmol/L (ref 22–32)
Calcium: 9.3 mg/dL (ref 8.9–10.3)
Chloride: 108 mmol/L (ref 98–111)
Creatinine, Ser: 0.89 mg/dL (ref 0.61–1.24)
GFR, Estimated: >60 mL/min
Glucose, Bld: 88 mg/dL (ref 70–99)
Potassium: 4 mmol/L (ref 3.5–5.1)
Sodium: 140 mmol/L (ref 135–145)
Total Bilirubin: 6.3 mg/dL — ABNORMAL HIGH (ref 0.0–1.2)
Total Protein: 7 g/dL (ref 6.5–8.1)

## 2024-04-30 LAB — CBC WITH DIFFERENTIAL/PLATELET
Abs Immature Granulocytes: 0.36 K/uL — ABNORMAL HIGH (ref 0.00–0.07)
Basophils Absolute: 0.1 K/uL (ref 0.0–0.1)
Basophils Relative: 0 %
Eosinophils Absolute: 0 K/uL (ref 0.0–0.5)
Eosinophils Relative: 0 %
HCT: 43 % (ref 39.0–52.0)
Hemoglobin: 14.3 g/dL (ref 13.0–17.0)
Immature Granulocytes: 1 %
Lymphocytes Relative: 4 %
Lymphs Abs: 1.4 K/uL (ref 0.7–4.0)
MCH: 32.8 pg (ref 26.0–34.0)
MCHC: 33.3 g/dL (ref 30.0–36.0)
MCV: 98.6 fL (ref 80.0–100.0)
Monocytes Absolute: 2.2 K/uL — ABNORMAL HIGH (ref 0.1–1.0)
Monocytes Relative: 7 %
Neutro Abs: 28.5 K/uL — ABNORMAL HIGH (ref 1.7–7.7)
Neutrophils Relative %: 88 %
Platelets: 247 K/uL (ref 150–400)
RBC: 4.36 MIL/uL (ref 4.22–5.81)
RDW: 12.3 % (ref 11.5–15.5)
Smear Review: NORMAL
WBC: 32.5 K/uL — ABNORMAL HIGH (ref 4.0–10.5)
nRBC: 0 % (ref 0.0–0.2)

## 2024-04-30 LAB — CBG MONITORING, ED: Glucose-Capillary: 85 mg/dL (ref 70–99)

## 2024-04-30 LAB — LIPASE, BLOOD: Lipase: 18 U/L (ref 11–51)

## 2024-04-30 MED ORDER — PIPERACILLIN-TAZOBACTAM 3.375 G IVPB
3.3750 g | Freq: Three times a day (TID) | INTRAVENOUS | Status: DC
  Administered 2024-04-30 – 2024-05-04 (×11): 3.375 g via INTRAVENOUS
  Filled 2024-04-30 (×12): qty 50

## 2024-04-30 MED ORDER — LACTATED RINGERS IV SOLN: INTRAVENOUS | Status: DC

## 2024-04-30 MED ORDER — IOHEXOL 350 MG/ML SOLN
75.0000 mL | Freq: Once | INTRAVENOUS | Status: AC | PRN
  Administered 2024-04-30: 75 mL via INTRAVENOUS

## 2024-04-30 MED ORDER — ONDANSETRON HCL 4 MG/2ML IJ SOLN: 4.0000 mg | Freq: Four times a day (QID) | INTRAMUSCULAR | Status: DC | PRN

## 2024-04-30 MED ORDER — DEXTROSE IN LACTATED RINGERS 5 % IV SOLN: INTRAVENOUS | Status: AC

## 2024-04-30 MED ORDER — ONDANSETRON HCL 4 MG PO TABS: 4.0000 mg | ORAL_TABLET | Freq: Four times a day (QID) | ORAL | Status: DC | PRN

## 2024-04-30 MED ORDER — IBUPROFEN 600 MG PO TABS
600.0000 mg | ORAL_TABLET | Freq: Three times a day (TID) | ORAL | Status: DC | PRN
  Administered 2024-05-01 – 2024-05-04 (×2): 600 mg via ORAL
  Filled 2024-04-30 (×2): qty 1

## 2024-04-30 MED ORDER — LACTATED RINGERS IV BOLUS
1000.0000 mL | Freq: Once | INTRAVENOUS | Status: AC
  Administered 2024-04-30: 1000 mL via INTRAVENOUS

## 2024-04-30 MED ORDER — POLYETHYLENE GLYCOL 3350 17 G PO PACK: 17.0000 g | PACK | Freq: Every day | ORAL | Status: DC | PRN

## 2024-04-30 NOTE — H&P (Addendum)
 " History and Physical    Bobby Stafford FMW:[id number] DOB: 12/05/1975 DOA: 04/30/2024  PCP: Patient, No Pcp Per   Patient coming from: [facility].  I have personally briefly reviewed patient's old medical records in [facility]  Chief Complaint: Abnormal labs, right upper quadrant pain  HPI: Bobby Stafford is a 48 y.o. male with medical history significant for multiple sclerosis, paraparesis. Patient was sent to the ED from nursing home reports right upper quadrant abdominal pain of 2-3 days and abnormal labs.  Liver enzymes are elevated-ALP 202, AST 506, ALT 657, T. bili 4.3., including bilirubin, and white count was 17. Patient reports today he has not had any abdominal pain.  No vomiting , he has had 1 episode of loose stool.  No urinary symptoms.  No chest pain no difficulty breathing no cough.  ED Course: Tmax 98.8.  Respiratory rate-15-20.  Blood pressure systolic 105-114.  Heart rate 77-87.  O2 sats greater 95% on room air. Leukocytosis of 32.5. LFTs worsening-AST 336, ALT 654, ALP 220, T. bili 6.3. CTAP W contrast-no acute abnormality seen in abdomen or pelvis. RUQ Us -findings consistent with cholelithiasis with mild gallbladder wall thickening at 4 mm.  Presumably noncalcified stones as there were not visualized on CT scan.  If concern for cystitis HIDA is recommended. IV Zosyn  started. 1 L LR bolus given. EDP talked to GI on-call Dr. Abran, feels this is likely hepatitis or an obstructive pathology, recommended admission, agree with antibiotics, will see in consult in a.m. and determine if further workup with MRCP or ERCP is needed.  Review of Systems: As per HPI all other systems reviewed and negative.  Past Medical History:  Diagnosis Date   Arthritis    MS (multiple sclerosis)     Past Surgical History:  Procedure Laterality Date   no surgerys       reports that he has been smoking cigarettes. He has a 12 pack-year smoking history. He has never used  smokeless tobacco. He reports current drug use. Frequency: 7.00 times per week. Drug: Marijuana. He reports that he does not drink alcohol.  Allergies[1]  Family History  Problem Relation Age of Onset   Diabetes type I Father    Prior to Admission medications  Medication Sig Start Date End Date Taking? Authorizing Provider  baclofen  (LIORESAL ) 10 MG tablet Take 1 tablet (10 mg total) by mouth 3 (three) times daily. 06/18/14   Jegede, Olugbemiga E, MD  dalfampridine (AMPYRA) 10 MG TB12 Take 10 mg by mouth daily.    [provider]  diclofenac  (FLECTOR ) 1.3 % PTCH Place 1 patch onto the skin 2 (two) times daily. Patient not taking: Reported on 11/06/2014 10/29/13   Oleta Berwyn LABOR, NP  Dimethyl Fumarate  (TECFIDERA ) 240 MG CPDR Take 1 capsule (240 mg total) by mouth every morning. Patient not taking: Reported on 08/19/2014 04/10/14   Jegede, Olugbemiga E, MD  DULoxetine (CYMBALTA) 30 MG capsule Take 30 mg by mouth daily. 07/28/17   [provider]  gabapentin  (NEURONTIN ) 300 MG capsule Take 3 capsules (900 mg total) by mouth 3 (three) times daily. Patient needs office visit for refills 08/19/15   Jegede, Olugbemiga E, MD  ibuprofen (ADVIL,MOTRIN) 600 MG tablet Take 600 mg by mouth every 6 hours as needed for pain 12/08/14   [provider]  LORazepam  (ATIVAN ) 1 MG tablet Take 1 tablet (1 mg total) by mouth every 8 (eight) hours as needed (spasms). Patient not taking: Reported on 04/10/2014 07/09/13  Billy Junnie HERO, MD  naproxen sodium (ANAPROX) 220 MG tablet Take 220 mg by mouth 2 (two) times daily as needed (pain).     [provider]  NON FORMULARY Take 1,500 mg by mouth daily.    [provider]  ocrelizumab  (OCREVUS ) 300 MG/10ML injection Inject 20 mLs into the vein every 6 (six) months. 07/21/16   [provider]  oxyCODONE -acetaminophen  (PERCOCET) 10-325 MG per tablet Take 1 tablet by mouth every 4 (four) hours as needed for pain.    [provider]  tadalafil  (CIALIS ) 20 MG tablet Take 0.5 tablets (10 mg total) by mouth every other day as needed for erectile dysfunction. 03/09/15   Newlin, Enobong, MD  topiramate (TOPAMAX) 50 MG tablet Take 100 mg by mouth 2 (two) times daily. 07/28/17   [provider]  traMADol  (ULTRAM ) 50 MG tablet Take 1 tablet (50 mg total) by mouth every 8 (eight) hours as needed. Patient not taking: Reported on 08/19/2014 04/10/14   Jegede, Olugbemiga E, MD  Vitamin D , Ergocalciferol , (DRISDOL) 50000 UNITS CAPS capsule Take 50,000 Units by mouth every 7 (seven) days.  02/19/15   [provider]    Physical Exam: Vitals:   04/30/24 1152 04/30/24 1501 04/30/24 1703 04/30/24 1809  BP: (!) 114/58 105/61 113/63   Pulse: 87 77 82   Resp: 20 18 15    Temp: 98.8 F (37.1 C) 98.2 F (36.8 C) 98.2 F (36.8 C) 98.2 F (36.8 C)  TempSrc: Oral Oral Oral Oral  SpO2: 97% 97% 98%   Weight:      Height:        Constitutional: NAD, calm, comfortable Vitals:   04/30/24 1152 04/30/24 1501 04/30/24 1703 04/30/24 1809  BP: (!) 114/58 105/61 113/63   Pulse: 87 77 82   Resp: 20 18 15    Temp: 98.8 F (37.1 C) 98.2 F (36.8 C) 98.2 F (36.8 C) 98.2 F (36.8 C)  TempSrc: Oral Oral Oral Oral  SpO2: 97% 97% 98%   Weight:      Height:       Eyes: PERRL, lids and conjunctivae normal ENMT: Mucous membranes are moist. Posterior pharynx clear of any exudate or lesions.  Neck: normal, supple, no masses, no thyromegaly Respiratory: clear to auscultation bilaterally, no wheezing, no crackles. Normal respiratory effort. No accessory muscle use.  Cardiovascular: Regular rate and rhythm, no murmurs / rubs / gallops. No extremity edema. Abdomen: no tenderness, no masses palpated. No hepatosplenomegaly. .  Musculoskeletal: no clubbing / cyanosis. No joint deformity upper and lower extremities. Skin: no rashes, lesions, ulcers. No induration Neurologic: No facial asymmetry, weakness to right upper  extremity- 4/5, weak grip strength and 0/5 strength to bilateral lower extremity.  5/5 strength with good grip strength to left upper extremity Psychiatric: Normal judgment and insight. Alert and oriented x 3. Normal mood.   Labs on Admission: I have personally reviewed following labs and imaging studies  CBC: Recent Labs  Lab 04/30/24 1250  WBC 32.5*  NEUTROABS 28.5*  HGB 14.3  HCT 43.0  MCV 98.6  PLT 247   Basic Metabolic Panel: Recent Labs  Lab 04/30/24 1250  NA 140  K 4.0  CL 108  CO2 22  GLUCOSE 88  BUN 16  CREATININE 0.89  CALCIUM 9.3   GFR: Estimated Creatinine Clearance: 149 mL/min (by C-G formula based on SCr of 0.89 mg/dL). Liver Function Tests: Recent Labs  Lab 04/30/24 1250  AST 336*  ALT 654*  ALKPHOS 220*  BILITOT 6.3*  PROT 7.0  ALBUMIN 4.1   Recent Labs  Lab 04/30/24 1250  LIPASE 18   Urine analysis:    Component Value Date/Time   COLORURINE YELLOW 08/14/2017 0344   APPEARANCEUR CLEAR 08/14/2017 0344   LABSPEC 1.026 08/14/2017 0344   PHURINE 5.0 08/14/2017 0344   GLUCOSEU NEGATIVE 08/14/2017 0344   HGBUR SMALL (A) 08/14/2017 0344   BILIRUBINUR NEGATIVE 08/14/2017 0344   KETONESUR NEGATIVE 08/14/2017 0344   PROTEINUR NEGATIVE 08/14/2017 0344   UROBILINOGEN 1.0 10/16/2013 1723   NITRITE NEGATIVE 08/14/2017 0344   LEUKOCYTESUR NEGATIVE 08/14/2017 0344    Radiological Exams on Admission: US  Abdomen Limited RUQ (LIVER/GB) Result Date: 04/30/2024 CLINICAL DATA:  Elevated liver function tests EXAM: ULTRASOUND ABDOMEN LIMITED RIGHT UPPER QUADRANT COMPARISON:  CT scan of same day FINDINGS: Gallbladder: WES sign is noted suggesting cholelithiasis, presumably non calcified as they do not appear on CT scan. Mild gallbladder wall thickening is noted at 4 mm. No sonographic Murphy's sign is noted. Common bile duct: Diameter: 5 mm which is within normal limits. Liver: No focal lesion identified. Within normal limits in parenchymal echogenicity.  Portal vein is patent on color Doppler imaging with normal direction of blood flow towards the liver. Other: None. IMPRESSION: Findings consistent with cholelithiasis with mild gallbladder wall thickening at 4 mm. Presumably the stones are noncalcified as they are not clearly visualized on CT scan of same day. If there is clinical concern for cholecystitis, HIDA scan is recommended for further evaluation. Electronically Signed   By: Lynwood Landy Raddle M.D.   On: 04/30/2024 15:30   CT ABDOMEN PELVIS W CONTRAST Result Date: 04/30/2024 CLINICAL DATA:  Mild right upper quadrant abdominal pain. Abnormal liver enzymes. EXAM: CT ABDOMEN AND PELVIS WITH CONTRAST TECHNIQUE: Multidetector CT imaging of the abdomen and pelvis was performed using the standard protocol following bolus administration of intravenous contrast. RADIATION DOSE REDUCTION: This exam was performed according to the departmental dose-optimization program which includes automated exposure control, adjustment of the mA and/or kV according to patient size and/or use of iterative reconstruction technique. CONTRAST:  75mL OMNIPAQUE  IOHEXOL  350 MG/ML SOLN COMPARISON:  None Available. FINDINGS: Lower chest: No acute abnormality. Hepatobiliary: No focal liver abnormality is seen. No gallstones, gallbladder wall thickening, or biliary dilatation. Pancreas: Unremarkable. No pancreatic ductal dilatation or surrounding inflammatory changes. Spleen: Normal in size without focal abnormality. Adrenals/Urinary Tract: Adrenal glands are unremarkable. Kidneys are normal, without renal calculi, focal lesion, or hydronephrosis. Bladder is unremarkable. Stomach/Bowel: Stomach is within normal limits. Appendix appears normal. No evidence of bowel wall thickening, distention, or inflammatory changes. Vascular/Lymphatic: Aortic atherosclerosis. No enlarged abdominal or pelvic lymph nodes. Reproductive: Prostate is unremarkable. Other: No abdominal wall hernia or abnormality. No  abdominopelvic ascites. Musculoskeletal: No acute or significant osseous findings. IMPRESSION: 1. No acute abnormality seen in the abdomen or pelvis. 2. Aortic atherosclerosis. Aortic Atherosclerosis (ICD10-I70.0). Electronically Signed   By: Lynwood Landy Raddle M.D.   On: 04/30/2024 15:20   ZXH:Wnwz  Assessment/Plan Principal Problem:   Elevated liver enzymes Active Problems:   MS (multiple sclerosis)   Paraparesis (HCC)  Assessment and Plan:  Elevated liver enzymes-worsening leukocytosis of  32.5, AST of 336, ALT of 652, ALP of 220, T. bili of 6.3.  Initial abdominal pain has resolved.  Afebrile. CTAP WC negative for acute abnormality. RUQ ultrasound shows cholelithiasis likely noncalcified, with mild gallbladder wall thickening.  Consider HIDA if concern for cholecystitis.  Also concern that this may be medication induced hepatitis. -EDP  talked with Dr. Abran with GI, feels this is likely hepatitis or an obstructive pathology, recommended admission, agree with antibiotics, will see in consult in a.m. and determine if further workup with MRCP or ERCP is needed. - IV Zosyn  - N.p.o. midnight -Follow-up hepatitis panel - 1 L bolus given, continue Lr 100cc/hr x 15hrs - Hold Tecfidera , Ocrevus  and Ampyra  Multiple sclerosis, paraparesis- ambulates with wheelchair, has been residing in nursing home the past 2 years.  Right upper extremity and bilateral lower extremity weakness.  Patient is on Tecfidera , Ocrevus , and Ampyra for treatment of multiple sclerosis. - Per Up to date, Tecfidera  can cause increased AST, Ocrevus  caused hepatic injury in postmarketing surveillance.  DVT prophylaxis: SCDs Code Status: Full code Family Communication: None at bedside Disposition Plan: ~ 2 days Consults called: Gi Admission status: inpt Med surg I certify that at the point of admission it is my clinical judgment that the patient will require inpatient hospital care spanning beyond 2 midnights from the point  of admission due to high intensity of service, high risk for further deterioration and high frequency of surveillance required.   Author: Tully FORBES Carwin, MD 04/30/2024 8:43 PM  For on call review [email or web address].      [1] No Known Allergies  "

## 2024-04-30 NOTE — ED Provider Notes (Signed)
 " [place] EMERGENCY DEPARTMENT AT [facility] Provider Note   HPI/ROS    History obtained from patient.  Bobby Stafford is a 48 y.o. male who presents for Abdominal Pain and Abnormal Lab and who  has a past medical history of Arthritis and MS (multiple sclerosis).  Patient presents today from [facility] with 3 days of right upper quadrant pain.  States that it is largely resolved today.  Labs were done at facility which were abnormal and there was concern for possible bowel blockage.  States he has felt feverish at home and has had sweats and chills.  Denies any chest pain, shortness of breath, nausea, vomiting, diarrhea, dysuria, or increased urinary frequency.  States that his pain has largely now resolved, but state he had significant right upper quadrant pain yesterday.  States he has a history of MS since 2015 and is on multiple medications for this.  MDM   I have reviewed the nursing documentation, vital signs, as well as the past medical history, surgical history, family history, and social history.  Initial Assessment:  Patient hemodynamically stable on initial evaluation.  Very diaphoretic and has yellowish appearance to his eyes.  Labs obtained in first look with significant leukocytosis with left shift.  White count of 32 with no significant anemia or thrombocytopenia.  Also has obstructive signs on CMP with elevated transaminases, alk phos, and total bilirubin of 6.3.  No significant electrolyte O'Malley's or AKI.  Lipase within normal limits.  Tylenol  negative.  Right upper quadrant ultrasound with cholelithiasis without signs of cholecystitis and CT abdomen pelvis largely unremarkable as well.  Patient given Zosyn  upfront given concerns for choledocholithiasis/ascending cholangitis as well as a liter of LR.  Will touch base with GI.  Patient states his pain is largely resolved at this time, but still has very abnormal labs.  Could also be secondary to medications like  Ocrevus  or his Tecfidera  as these can cause liver injury and reactivation of prior hepatitis B.  Spoke with Dr. Abran with GI, and he feels this is likely hepatitis rather than obstructive pathology.  He is okay with continuing IV antibiotics until further etiology is revealed.  Recommends admission to medicine for further workup and management and he will consult.  Spoke with medicine regarding admission and they agree.  Patient admitted to the hospitalist service for further workup and management.  Was told to consult surgery if pain recurs or if he starts having fevers or other signs of Coley cystitis.  Disposition:  I discussed the case with Dr.Emokpae who graciously agreed to admit the patient to their service for continued care.    This patient was staffed with Dr. Laurice who supervised the visit and agreed with the plan of care.   Due to the patients current presenting symptoms, physical exam findings, and the workup stated above, it is thought that the etiology of the patients current presentation is:  1. Right upper quadrant abdominal pain   2. Hyperbilirubinemia   3. Transaminitis    Clinical Complexity A medically appropriate history, review of systems, and physical exam was performed.  Factors that affect the complexity of this encounter: assessment of correct protocol and laboratory work from this visit  My independent interpretations of diagnostic studies are documented in the ED course above.   If decision rules were used in this patient's evaluation, they are listed below.   Click here for ABCD2, HEART and other calculators  Patient's presentation is most consistent with acute presentation  with potential threat to life or bodily function.  MDM generated using voice dictation software and may contain dictation errors. Please contact me for any clarification or with any questions.    Physical Exam, PMH, PSH, Family History, and Social Hsitory   Vitals:   04/30/24  1501 04/30/24 1703 04/30/24 1809 04/30/24 1858  BP: 105/61 113/63  108/64  Pulse: 77 82  82  Resp: 18 15  16   Temp: 98.2 F (36.8 C) 98.2 F (36.8 C) 98.2 F (36.8 C)   TempSrc: Oral Oral Oral   SpO2: 97% 98%  95%  Weight:      Height:        Physical Exam HENT:     Head: Normocephalic and atraumatic.     Mouth/Throat:     Mouth: Mucous membranes are moist.     Pharynx: Oropharynx is clear.  Eyes:     General: Scleral icterus present.     Pupils: Pupils are equal, round, and reactive to light.  Pulmonary:     Effort: Pulmonary effort is normal.     Breath sounds: Normal breath sounds. No wheezing or rales.  Abdominal:     General: Abdomen is protuberant.     Tenderness: There is no abdominal tenderness. There is no guarding or rebound.  Skin:    General: Skin is warm and dry.     Capillary Refill: Capillary refill takes less than 2 seconds.  Neurological:     General: No focal deficit present.     Mental Status: He is alert and oriented to person, place, and time.     Past Medical History:  Diagnosis Date   Arthritis    MS (multiple sclerosis)      Past Surgical History:  Procedure Laterality Date   no surgerys       Family History  Problem Relation Age of Onset   Diabetes type I Father     Social History   Tobacco Use   Smoking status: Every Day    Current packs/day: 0.50    Average packs/day: 0.5 packs/day for 24.0 years (12.0 ttl pk-yrs)    Types: Cigarettes   Smokeless tobacco: Never   Tobacco comments:    patient is aware that smoking is not good for him   Substance Use Topics   Alcohol use: No    Alcohol/week: 20.0 standard drinks of alcohol    Types: 20 Cans of beer per week     Procedures   If procedures were preformed on this patient, they are listed below:  Procedures   Electronically signed by:   Glendia Carlin Ancona, M.D. PGY-2, Emergency Medicine   Please note that this documentation was produced with the assistance of  voice-to-text technology and may contain errors.    Ancona Glendia, MD 04/30/24 1940  "

## 2024-04-30 NOTE — ED Provider Triage Note (Signed)
 Emergency Medicine Provider Triage Evaluation Note  Bobby Stafford , a 48 y.o. male  was evaluated in triage.  Pt complains of abnormal labs elevated liver enzymes patient does not have any active pain history of quadriplegia.  Maybe some right upper quadrant abdominal pain for the last few days but none currently.  Review of Systems  Positive: none abdominal pain is resolved at this time. Negative: Abdominal pain nausea vomiting but is passing gas  Physical Exam  BP (!) 114/58   Pulse 87   Temp 98.8 F (37.1 C) (Oral)   Resp 20   Ht 6' 4.5 (1.943 m)   Wt 130.2 kg   SpO2 97%   BMI 34.48 kg/m  Gen:   Awake, no distress   Resp:  Normal effort  MSK:   Moves extremities without difficulty  Other:    Medical Decision Making  Medically screening exam initiated at 1:15 PM.  Appropriate orders placed.  Bobby Stafford was informed that the remainder of the evaluation will be completed by another provider, this initial triage assessment does not replace that evaluation, and the importance of remaining in the ED until their evaluation is complete.  Patient sent here from facility due to abnormal labs.  He denies any pain now but it sounds like he is have right upper quadrant abdominal pain on and off for the last couple days.  White count was mildly elevated at 17.  Liver enzymes and bilirubin were elevated.  ALP 202 AST 506 ALT 657 bilirubin 4.3.  He denies any fevers or chills.  No active pain at this time.    Bobby Cornet, DO 04/30/24 [id number]

## 2024-04-30 NOTE — ED Triage Notes (Signed)
 Pt from [facility] Pt c.o RUQ pain x 3 days. Labs done at facility with abnormal labs and reported bowel blockage. Denies n/v, reports normal Bms.   On paperwork that came with pt: WBC 17 K3.3 Bili 4.3 ALP 202 AST 506 ALT 657   Pt is bedbound, denies abd pain at this time

## 2024-05-01 ENCOUNTER — Inpatient Hospital Stay (HOSPITAL_COMMUNITY)

## 2024-05-01 DIAGNOSIS — R748 Abnormal levels of other serum enzymes: Secondary | ICD-10-CM | POA: Diagnosis not present

## 2024-05-01 DIAGNOSIS — R932 Abnormal findings on diagnostic imaging of liver and biliary tract: Secondary | ICD-10-CM

## 2024-05-01 DIAGNOSIS — R7989 Other specified abnormal findings of blood chemistry: Secondary | ICD-10-CM

## 2024-05-01 DIAGNOSIS — R1011 Right upper quadrant pain: Principal | ICD-10-CM

## 2024-05-01 DIAGNOSIS — R17 Unspecified jaundice: Secondary | ICD-10-CM | POA: Diagnosis not present

## 2024-05-01 DIAGNOSIS — K8020 Calculus of gallbladder without cholecystitis without obstruction: Secondary | ICD-10-CM

## 2024-05-01 DIAGNOSIS — D72829 Elevated white blood cell count, unspecified: Secondary | ICD-10-CM | POA: Diagnosis not present

## 2024-05-01 LAB — GLUCOSE, CAPILLARY: Glucose-Capillary: 134 mg/dL — ABNORMAL HIGH (ref 70–99)

## 2024-05-01 LAB — COMPREHENSIVE METABOLIC PANEL WITH GFR
ALT: 474 U/L — ABNORMAL HIGH (ref 0–44)
AST: 170 U/L — ABNORMAL HIGH (ref 15–41)
Albumin: 3.7 g/dL (ref 3.5–5.0)
Alkaline Phosphatase: 187 U/L — ABNORMAL HIGH (ref 38–126)
Anion gap: 11 (ref 5–15)
BUN: 13 mg/dL (ref 6–20)
CO2: 21 mmol/L — ABNORMAL LOW (ref 22–32)
Calcium: 9.1 mg/dL (ref 8.9–10.3)
Chloride: 108 mmol/L (ref 98–111)
Creatinine, Ser: 0.77 mg/dL (ref 0.61–1.24)
GFR, Estimated: >60 mL/min
Glucose, Bld: 79 mg/dL (ref 70–99)
Potassium: 3.6 mmol/L (ref 3.5–5.1)
Sodium: 139 mmol/L (ref 135–145)
Total Bilirubin: 2.5 mg/dL — ABNORMAL HIGH (ref 0.0–1.2)
Total Protein: 6.5 g/dL (ref 6.5–8.1)

## 2024-05-01 LAB — CBC
HCT: 39.8 % (ref 39.0–52.0)
Hemoglobin: 13.4 g/dL (ref 13.0–17.0)
MCH: 33.4 pg (ref 26.0–34.0)
MCHC: 33.7 g/dL (ref 30.0–36.0)
MCV: 99.3 fL (ref 80.0–100.0)
Platelets: 188 K/uL (ref 150–400)
RBC: 4.01 MIL/uL — ABNORMAL LOW (ref 4.22–5.81)
RDW: 12.6 % (ref 11.5–15.5)
WBC: 23.6 K/uL — ABNORMAL HIGH (ref 4.0–10.5)
nRBC: 0 % (ref 0.0–0.2)

## 2024-05-01 LAB — HIV ANTIBODY (ROUTINE TESTING W REFLEX): HIV Screen 4th Generation wRfx: NONREACTIVE

## 2024-05-01 LAB — PROTIME-INR
INR: 1.1 (ref 0.8–1.2)
Prothrombin Time: 15.2 s (ref 11.4–15.2)

## 2024-05-01 MED ORDER — DULOXETINE HCL 60 MG PO CPEP
60.0000 mg | ORAL_CAPSULE | Freq: Every day | ORAL | Status: DC
  Administered 2024-05-02 – 2024-05-04 (×3): 60 mg via ORAL
  Filled 2024-05-01 (×3): qty 1

## 2024-05-01 MED ORDER — PREGABALIN 75 MG PO CAPS
75.0000 mg | ORAL_CAPSULE | Freq: Three times a day (TID) | ORAL | Status: DC
  Administered 2024-05-01 – 2024-05-04 (×9): 75 mg via ORAL
  Filled 2024-05-01 (×7): qty 1
  Filled 2024-05-01: qty 3
  Filled 2024-05-01: qty 1

## 2024-05-01 MED ORDER — TRAMADOL HCL 50 MG PO TABS
50.0000 mg | ORAL_TABLET | Freq: Three times a day (TID) | ORAL | Status: DC | PRN
  Administered 2024-05-03 – 2024-05-04 (×3): 50 mg via ORAL
  Filled 2024-05-01 (×3): qty 1

## 2024-05-01 MED ORDER — TOPIRAMATE 25 MG PO TABS: 100.0000 mg | ORAL_TABLET | Freq: Two times a day (BID) | ORAL | Status: DC

## 2024-05-01 MED ORDER — DALFAMPRIDINE ER 10 MG PO TB12
10.0000 mg | ORAL_TABLET | Freq: Two times a day (BID) | ORAL | Status: DC
  Administered 2024-05-02 – 2024-05-04 (×4): 10 mg via ORAL
  Filled 2024-05-01 (×7): qty 1

## 2024-05-01 MED ORDER — ADULT MULTIVITAMIN W/MINERALS CH
1.0000 | ORAL_TABLET | Freq: Every morning | ORAL | Status: DC
  Administered 2024-05-02 – 2024-05-04 (×3): 1 via ORAL
  Filled 2024-05-01 (×4): qty 1

## 2024-05-01 MED ORDER — SENNOSIDES-DOCUSATE SODIUM 8.6-50 MG PO TABS
2.0000 | ORAL_TABLET | Freq: Every day | ORAL | Status: DC
  Administered 2024-05-01 – 2024-05-03 (×3): 2 via ORAL
  Filled 2024-05-01 (×3): qty 2

## 2024-05-01 MED ORDER — PREDNISONE 10 MG PO TABS
10.0000 mg | ORAL_TABLET | Freq: Every day | ORAL | Status: DC
  Administered 2024-05-02 – 2024-05-04 (×3): 10 mg via ORAL
  Filled 2024-05-01 (×3): qty 1

## 2024-05-01 MED ORDER — LACTULOSE 10 GM/15ML PO SOLN
20.0000 g | Freq: Every day | ORAL | Status: DC
  Administered 2024-05-01 – 2024-05-03 (×3): 20 g via ORAL
  Filled 2024-05-01 (×3): qty 30

## 2024-05-01 MED ORDER — ATORVASTATIN CALCIUM 10 MG PO TABS
10.0000 mg | ORAL_TABLET | Freq: Every day | ORAL | Status: DC
  Administered 2024-05-01 – 2024-05-03 (×3): 10 mg via ORAL
  Filled 2024-05-01 (×3): qty 1

## 2024-05-01 MED ORDER — GADOBUTROL 1 MMOL/ML IV SOLN
10.0000 mL | Freq: Once | INTRAVENOUS | Status: AC | PRN
  Administered 2024-05-01: 10 mL via INTRAVENOUS

## 2024-05-01 MED ORDER — LIDOCAINE 5 % EX PTCH
1.0000 | MEDICATED_PATCH | CUTANEOUS | Status: DC
  Administered 2024-05-01 – 2024-05-03 (×3): 1 via TRANSDERMAL
  Filled 2024-05-01 (×3): qty 1

## 2024-05-01 MED ORDER — RISAQUAD PO CAPS
1.0000 | ORAL_CAPSULE | Freq: Every morning | ORAL | Status: DC
  Administered 2024-05-02 – 2024-05-04 (×3): 1 via ORAL
  Filled 2024-05-01 (×3): qty 1

## 2024-05-01 MED ORDER — BACLOFEN 10 MG PO TABS
20.0000 mg | ORAL_TABLET | Freq: Three times a day (TID) | ORAL | Status: DC
  Administered 2024-05-01 – 2024-05-04 (×9): 20 mg via ORAL
  Filled 2024-05-01 (×9): qty 2

## 2024-05-01 NOTE — ED Notes (Signed)
 Patient transported to MRI

## 2024-05-01 NOTE — ED Notes (Incomplete)
 Pt requested to be changed, pt has wet brief I grad the

## 2024-05-01 NOTE — ED Notes (Addendum)
 NPO for MRI per MRI tech

## 2024-05-01 NOTE — ED Notes (Signed)
 Pulled patient into a empty room. Changed patient brief put another one on patient is now back in hall way 24

## 2024-05-01 NOTE — ED Notes (Signed)
 Pt in MRI.

## 2024-05-01 NOTE — Consult Note (Addendum)
 "   Consultation  Referring Provider: [facility]/Gherghe Primary Care Physician:  Patient, No Pcp Per Primary Gastroenterologist: Unassigned  Reason for Consultation: Acute right upper quadrant pain, elevated LFTs, leukocytosis  HPI: Bobby Stafford is a 48 y.o. 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  Patient is in a nursing facility and wheelchair-bound.  Also with history of hypertension and hyperlipidemia. He was brought to the emergency room on 04/30/2024 with complaints of 3-day history of progressive right upper quadrant pain.  Per the admitting notes by the time he was seen in the emergency room he reported that he was feeling better.  He did have chills and sweats prior to coming to the emergency room but denied any nausea or vomiting, no diarrhea, no chest pain.  He states the pain was located in his right upper abdomen and did not radiate.  Interestingly workup in ER yesterday with significant leukocytosis with WBC of 32.5/hemoglobin 14.3/hematocrit of 43 Lipase normal Potassium 4.0/glucose 88/BUN 16/creatinine 0.89 T. bili 6.3/alk phos 220/AST 336/ALT 654  Acute hepatitis panel negative CT of the abdomen and pelvis with contrast was unremarkable other than aortic atherosclerosis.  Abdominal ultrasound suggestive of cholelithiasis, noncalcified, gallbladder wall thickening to 4 mm and common bile duct of 5 mm, liver echogenicity within normal limits.  Patient was started on IV Zosyn  Labs today with WBC down to 23.6/hemoglobin 13.4/hematocrit 39.8/platelets 188 INR 1.1 T. bili 2.5/AST 170/ALT 474/alk phos 187  Patient says he has not had any pain at all today, feels better and is hungry He says he has not had any previous similar episodes of abdominal pain Tmax 99.4    Past Medical History:  Diagnosis Date   Arthritis    MS (multiple sclerosis)     Past Surgical History:  Procedure Laterality Date   no surgerys      Prior to Admission  medications  Medication Sig Start Date End Date Taking? Authorizing Provider  aspirin  81 MG chewable tablet Chew 81 mg by mouth daily.   Yes [provider]  atorvastatin (LIPITOR) 10 MG tablet Take 10 mg by mouth at bedtime.   Yes [provider]  baclofen  (LIORESAL ) 20 MG tablet Take 20 mg by mouth 3 (three) times daily.   Yes [provider]  celecoxib (CELEBREX) 400 MG capsule Take 400 mg by mouth in the morning.   Yes [provider]  dalfampridine (AMPYRA) 10 MG TB12 Take 10 mg by mouth every 12 (twelve) hours.   Yes [provider]  DULoxetine (CYMBALTA) 60 MG capsule Take 60 mg by mouth in the morning. 07/28/17  Yes [provider]  lactulose (CHRONULAC) 10 GM/15ML solution Take 20 g by mouth at bedtime.   Yes [provider]  lidocaine ([employer] LIDOCAINE PAIN RELIEF) 4 % Place 1 patch onto the skin daily.   Yes [provider]  Magnesium Glycinate 100 MG CAPS Take 300 mg by mouth in the morning.   Yes [provider]  Menthol, Topical Analgesic, (BIOFREEZE COOL THE PAIN) 4 % GEL Apply 1 Application topically 2 (two) times daily. Apply a thin layer topically twice a day at 1100 and 2100.  Apply to left shoulder for pain   Yes [provider]  Menthol, Topical Analgesic, (BIOFREEZE COOL THE PAIN) 4 % GEL Apply 1 Application topically every 8 (eight) hours as needed (left shoulder pain).   Yes [provider]  Multiple Vitamin (MULTIVITAMIN WITH MINERALS) TABS tablet Take 1 tablet by mouth  in the morning.   Yes [provider]  omega-3 acid ethyl esters (LOVAZA) 1 g capsule Take 1 g by mouth at bedtime.   Yes [provider]  predniSONE  (DELTASONE ) 10 MG tablet Take 10 mg by mouth in the morning.   Yes [provider]  pregabalin (LYRICA) 75 MG capsule Take 75 mg by mouth 3 (three) times daily.   Yes [provider]  saccharomyces boulardii (FLORASTOR) 250 MG  capsule Take 250 mg by mouth in the morning.   Yes [provider]  senna-docusate (SENOKOT-S) 8.6-50 MG tablet Take 2 tablets by mouth at bedtime.   Yes [provider]  topiramate (TOPAMAX) 50 MG tablet Take 100 mg by mouth 2 (two) times daily.   Yes [provider]  traMADol  (ULTRAM ) 50 MG tablet Take 1 tablet (50 mg total) by mouth every 8 (eight) hours as needed. Patient taking differently: Take 50 mg by mouth at bedtime. 04/10/14  Yes Jegede, Olugbemiga E, MD  Vitamin D , Ergocalciferol , (DRISDOL) 50000 UNITS CAPS capsule Take 50,000 Units by mouth every Monday. 02/19/15  Yes [provider]  zinc  gluconate 50 MG tablet Take 50 mg by mouth in the morning.   Yes [provider]    Current Facility-Administered Medications  Medication Dose Route Frequency Provider Last Rate Last Admin   atorvastatin (LIPITOR) tablet 10 mg  10 mg Oral QHS Gherghe, Costin M, MD       baclofen  (LIORESAL ) tablet 20 mg  20 mg Oral TID Gherghe, Costin M, MD       dalfampridine TB12 10 mg  10 mg Oral Q12H Gherghe, Costin M, MD       [START ON 05/02/2024] DULoxetine (CYMBALTA) DR capsule 60 mg  60 mg Oral Daily Gherghe, Costin M, MD       ibuprofen (ADVIL) tablet 600 mg  600 mg Oral Q8H PRN Emokpae, Ejiroghene E, MD   600 mg at 05/01/24 0025   lactulose (CHRONULAC) 10 GM/15ML solution 20 g  20 g Oral QHS Gherghe, Costin M, MD       lidocaine (LIDODERM) 5 % 1 patch  1 patch Transdermal Q24H Gherghe, Costin M, MD       [START ON 05/02/2024] multivitamin with minerals tablet 1 tablet  1 tablet Oral q AM Gherghe, Costin M, MD       ondansetron (ZOFRAN) tablet 4 mg  4 mg Oral Q6H PRN Emokpae, Ejiroghene E, MD       Or   ondansetron (ZOFRAN) injection 4 mg  4 mg Intravenous Q6H PRN Emokpae, Ejiroghene E, MD       piperacillin -tazobactam (ZOSYN ) IVPB 3.375 g  3.375 g Intravenous Q8H Everett, Scott, MD   Stopped at 05/01/24 0855   polyethylene glycol (MIRALAX  / GLYCOLAX ) packet 17 g   17 g Oral Daily PRN Emokpae, Ejiroghene E, MD       [START ON 05/02/2024] predniSONE  (DELTASONE ) tablet 10 mg  10 mg Oral Daily Gherghe, Costin M, MD       pregabalin (LYRICA) capsule 75 mg  75 mg Oral TID Gherghe, Costin M, MD       [START ON 05/02/2024] saccharomyces boulardii (FLORASTOR) capsule 250 mg  250 mg Oral q AM Gherghe, Costin M, MD       senna-docusate (Senokot-S) tablet 2 tablet  2 tablet Oral QHS Gherghe, Costin M, MD       topiramate (TOPAMAX) tablet 100 mg  100 mg Oral BID Gherghe, Costin M, MD  traMADol  (ULTRAM ) tablet 50 mg  50 mg Oral Q8H PRN Gherghe, Costin M, MD       Current Outpatient Medications  Medication Sig Dispense Refill   aspirin  81 MG chewable tablet Chew 81 mg by mouth daily.     atorvastatin (LIPITOR) 10 MG tablet Take 10 mg by mouth at bedtime.     baclofen  (LIORESAL ) 20 MG tablet Take 20 mg by mouth 3 (three) times daily.     celecoxib (CELEBREX) 400 MG capsule Take 400 mg by mouth in the morning.     dalfampridine (AMPYRA) 10 MG TB12 Take 10 mg by mouth every 12 (twelve) hours.     DULoxetine (CYMBALTA) 60 MG capsule Take 60 mg by mouth in the morning.  1   lactulose (CHRONULAC) 10 GM/15ML solution Take 20 g by mouth at bedtime.     lidocaine ([employer] LIDOCAINE PAIN RELIEF) 4 % Place 1 patch onto the skin daily.     Magnesium Glycinate 100 MG CAPS Take 300 mg by mouth in the morning.     Menthol, Topical Analgesic, (BIOFREEZE COOL THE PAIN) 4 % GEL Apply 1 Application topically 2 (two) times daily. Apply a thin layer topically twice a day at 1100 and 2100.  Apply to left shoulder for pain     Menthol, Topical Analgesic, (BIOFREEZE COOL THE PAIN) 4 % GEL Apply 1 Application topically every 8 (eight) hours as needed (left shoulder pain).     Multiple Vitamin (MULTIVITAMIN WITH MINERALS) TABS tablet Take 1 tablet by mouth in the morning.     omega-3 acid ethyl esters (LOVAZA) 1 g capsule Take 1 g by mouth at bedtime.     predniSONE  (DELTASONE ) 10 MG tablet Take  10 mg by mouth in the morning.     pregabalin (LYRICA) 75 MG capsule Take 75 mg by mouth 3 (three) times daily.     saccharomyces boulardii (FLORASTOR) 250 MG capsule Take 250 mg by mouth in the morning.     senna-docusate (SENOKOT-S) 8.6-50 MG tablet Take 2 tablets by mouth at bedtime.     topiramate (TOPAMAX) 50 MG tablet Take 100 mg by mouth 2 (two) times daily.     traMADol  (ULTRAM ) 50 MG tablet Take 1 tablet (50 mg total) by mouth every 8 (eight) hours as needed. (Patient taking differently: Take 50 mg by mouth at bedtime.) 90 tablet 1   Vitamin D , Ergocalciferol , (DRISDOL) 50000 UNITS CAPS capsule Take 50,000 Units by mouth every Monday.  1   zinc  gluconate 50 MG tablet Take 50 mg by mouth in the morning.      Allergies as of 04/30/2024   (No Known Allergies)    Family History  Problem Relation Age of Onset   Diabetes type I Father     Social History   Socioeconomic History   Marital status: Single    Spouse name: Not on file   Number of children: Not on file   Years of education: Not on file   Highest education level: Not on file  Occupational History   Not on file  Tobacco Use   Smoking status: Every Day    Current packs/day: 0.50    Average packs/day: 0.5 packs/day for 24.0 years (12.0 ttl pk-yrs)    Types: Cigarettes   Smokeless tobacco: Never   Tobacco comments:    patient is aware that smoking is not good for him   Substance and Sexual Activity   Alcohol use: No    Alcohol/week: 20.0  standard drinks of alcohol    Types: 20 Cans of beer per week   Drug use: Yes    Frequency: 7.0 times per week    Types: Marijuana   Sexual activity: Yes    Partners: Female  Other Topics Concern   Not on file  Social History Narrative   Not on file   Social Drivers of Health   Tobacco Use: High Risk (04/30/2024)   Patient History    Smoking Tobacco Use: Every Day    Smokeless Tobacco Use: Never    Passive Exposure: Not on file  Financial Resource Strain: Not on file   Food Insecurity: Not on file  Transportation Needs: Not on file  Physical Activity: Not on file  Stress: Not on file  Social Connections: Unknown (09/13/2021)   [facility]   Social Network    Social Network: Not on file  Intimate Partner Violence: Unknown (08/05/2021)   Received from [facility]   HITS    Physically Hurt: Not on file    Insult or Talk Down To: Not on file    Threaten Physical Harm: Not on file    Scream or Curse: Not on file  Depression (EYV7-0): Not on file  Alcohol Screen: Not on file  Housing: Not on file  Utilities: Not on file  Health Literacy: Not on file    Review of Systems: Pertinent positive and negative review of systems were noted in the above HPI section.  All other review of systems was otherwise negative.  Physical Exam: Vital signs in last 24 hours: Temp:  [98.2 F (36.8 C)-99.4 F (37.4 C)] 99.4 F (37.4 C) (12/31 1225) Pulse Rate:  [77-96] 96 (12/31 1225) Resp:  [15-18] 16 (12/31 1225) BP: (105-121)/(61-73) 118/64 (12/31 1225) SpO2:  [95 %-98 %] 96 % (12/31 1225)   General:   Alert,  Well-developed, well-nourished, older African male pleasant and cooperative in NAD lying in the hallway in the emergency room Head:  Normocephalic and atraumatic. Eyes:  Sclera early icterus,    Conjunctiva pink. Ears:  Normal auditory acuity. Nose:  No deformity, discharge,  or lesions. Mouth:  No deformity or lesions.   Neck:  Supple; no masses or thyromegaly. Lungs:  Clear throughout to auscultation.   No wheezes, crackles, or rhonchi.  Heart:  Regular rate and rhythm; no murmurs, clicks, rubs,  or gallops. Abdomen:  Soft, essentially nontender, bowel sounds are present, no palpable mass or hepatosplenomegaly Rectal: Not done Msk:  Symmetrical without gross deformities. . Pulses:  Normal pulses noted. Extremities:  Without clubbing or edema. Neurologic:  Alert and  oriented x4; upper and lower extremity weakness Skin:  Intact  without significant lesions or rashes.. Psych:  Alert and cooperative. Normal mood and affect.  Intake/Output from previous day: 12/30 0701 - 12/31 0700 In: 1044 [IV Piggyback:1044] Out: -  Intake/Output this shift: No intake/output data recorded.  Lab Results: Recent Labs    04/30/24 1250 05/01/24 0340  WBC 32.5* 23.6*  HGB 14.3 13.4  HCT 43.0 39.8  PLT 247 188   BMET Recent Labs    04/30/24 1250 05/01/24 0340  NA 140 139  K 4.0 3.6  CL 108 108  CO2 22 21*  GLUCOSE 88 79  BUN 16 13  CREATININE 0.89 0.77  CALCIUM 9.3 9.1   LFT Recent Labs    05/01/24 0340  PROT 6.5  ALBUMIN 3.7  AST 170*  ALT 474*  ALKPHOS 187*  BILITOT 2.5*  PT/INR Recent Labs    05/01/24 0340  LABPROT 15.2  INR 1.1     IMPRESSION:  #12 48 year old African-American male with severe MS, currently wheelchair-bound with paraparesis and living in a nursing facility who presented to the ER yesterday after onset of right upper quadrant pain a couple of days per previous which had become constant and progressive.  He had associated chills and sweats but no documented fever, no nausea or vomiting  Labs on arrival with significant leukocytosis to 32,000, and noted to have significantly elevated LFTs with T. bili of 6.3  CT unrevealing Abdominal ultrasound with finding of cholelithiasis, gallbladder wall thickening to 4 mm and a CBD of 5 mm.  Started on IV Zosyn   Patient's pain at this point has resolved, he is afebrile and has no complaints of nausea, is hungry  Picture is most consistent with improving acute cholecystitis and probable choledocholithiasis plus minus passage of a stone.  #2 hypertension #3 hyperlipidemia  PLAN: Have scheduled for stat MRI/MRCP this afternoon Okay for soft diet after MRCP then n.p.o. after midnight Continue Zosyn  Repeat labs in a.m. If MRCP positive for choledocholithiasis we can arrange for him to have ERCP with stone extraction tomorrow 05/03/2023  pending anesthesia availability, with Dr. Rosalie.  We briefly discussed ERCP today. GI will follow with you   Amy EsterwoodPA-C  05/01/2024, 1:26 PM    Attending physician's note   I have taken a history, reviewed the chart, and examined the patient. I performed a substantive portion of this encounter, including complete performance of at least one of the key components, in conjunction with the APP. I agree with the APP's note, impression, and recommendations with my edits.   48 year old male with medical history as outlined above, to include history of MS with paraparesis and wheelchair-bound for the last 2 years presents from nursing home with RUQ pain.  Symptoms started 3 days ago and did have chills PTA, but otherwise no associated nausea/vomiting, change in bowel habits.  Pain was nonradiating and no prior similar symptoms.  Pain has since abated since arrival.  Admission evaluation notable for the following: - WBC 32.5 --> 23.6 - H/H 13.4/39.8, PLT 188 - Normal BMP, PT/INR, APAP, lipase - AST/ALT 336/654 --> 170/474 - T. bili 6.3 --> 2.5 - ALP 220--> 187 - CT: Normal-appearing liver, GB, pancreas.  No duct dilation - RUQ US : Suspected cholelithiasis with minimal GB wall thickening at 4 mm.  CBD 5 mm without CDL  Comparison labs from 88/7976 with normal liver enzymes and T. bili 0.8.  1) RUQ pain 2) Leukocytosis 3) Elevated liver enzymes 4) Elevated bilirubin Suspect cholelithiasis with at least some component of acute cholecystitis with mild GB wall thickening at 4 mm.  Pain has since essentially resolved and otherwise hemodynamically stable and afebrile, so possibly already passed a stone. - Continue Zosyn  - MRCP - If choledocholithiasis on MRCP, tentative plan for ERCP tomorrow with Dr. Rosalie - If no stone on MRCP, surgical consult for lap cholecystectomy with IOC - Soft diet after MRCP completed then n.p.o. at midnight - Repeat liver enzymes and CBC in the morning  Bentzion Dauria, DO, FACG [phone] office           "

## 2024-05-01 NOTE — ED Notes (Signed)
 Pt was cleaned and brief changed

## 2024-05-01 NOTE — ED Notes (Signed)
"  Pt back from MRI  "

## 2024-05-01 NOTE — ED Notes (Signed)
 MD at bedside.

## 2024-05-01 NOTE — Progress Notes (Signed)
 " PROGRESS NOTE  Bobby Stafford FMW:[id number] DOB: Sep 19, 1975 DOA: 04/30/2024 PCP: Patient, No Pcp Per   LOS: 1 day   Brief Narrative / Interim history: 48 year old male with history of MS, paraparesis, lives in a nursing home comes into the hospital with right upper quadrant abdominal pain.  He was found to have significant elevation of LFTs, GI was consulted and he was admitted to the hospital  Subjective / 24h Interval events: He denies any abdominal pain this morning.  No nausea or vomiting.  Assesement and Plan: Principal problem Elevated LFTs-on admission was found to have AST and ALT into the 3-600 range, white count of 32K and a total bilirubin of 6.3.  CT scan of the abdomen and pelvis was without acute abnormalities, but an ultrasound revealed cholelithiasis with mild gallbladder wall thickening at 4 mm - LFTs improving this morning, his symptoms have resolved.  I suspect he may have passed a stone.  GI consult appreciated  Active problems MS, paraparesis -wheelchair-bound, has been in the nursing home for the past 2 years.  He has right upper extremity bilateral lower extremity weakness.  Continue home medications  Leukocytosis-due to #1, continue Zosyn .  Improving today  Scheduled Meds:  atorvastatin  10 mg Oral QHS   baclofen   20 mg Oral TID   dalfampridine  10 mg Oral Q12H   [START ON 05/02/2024] DULoxetine  60 mg Oral q AM   lactulose  20 g Oral QHS   lidocaine  1 patch Transdermal Q24H   [START ON 05/02/2024] multivitamin with minerals  1 tablet Oral q AM   [START ON 05/02/2024] predniSONE   10 mg Oral q AM   pregabalin  75 mg Oral TID   [START ON 05/02/2024] saccharomyces boulardii  250 mg Oral q AM   senna-docusate  2 tablet Oral QHS   topiramate  100 mg Oral BID   Continuous Infusions:  piperacillin -tazobactam Stopped (05/01/24 0855)   PRN Meds:.ibuprofen, ondansetron **OR** ondansetron (ZOFRAN) IV, polyethylene glycol, traMADol   Current Outpatient Medications   Medication Instructions   aspirin  81 mg, Oral, Daily   atorvastatin (LIPITOR) 10 mg, Oral, Daily at bedtime   baclofen  (LIORESAL ) 20 mg, Oral, 3 times daily   celecoxib (CELEBREX) 400 mg, Oral, Every morning   dalfampridine (AMPYRA) 10 mg, Every 12 hours   DULoxetine (CYMBALTA) 60 mg, Every morning   lactulose (CHRONULAC) 20 g, Oral, Daily at bedtime   lidocaine ([employer] LIDOCAINE PAIN RELIEF) 4 % 1 patch, Transdermal, Every 24 hours   Magnesium Glycinate 300 mg, Oral, Every morning   Menthol, Topical Analgesic, (BIOFREEZE COOL THE PAIN) 4 % GEL 1 Application, Topical, 2 times daily, Apply a thin layer topically twice a day at 1100 and 2100.  Apply to left shoulder for pain   Menthol, Topical Analgesic, (BIOFREEZE COOL THE PAIN) 4 % GEL 1 Application, Topical, Every 8 hours PRN   Multiple Vitamin (MULTIVITAMIN WITH MINERALS) TABS tablet 1 tablet, Oral, Every morning   omega-3 acid ethyl esters (LOVAZA) 1 g, Oral, Daily at bedtime   predniSONE  (DELTASONE ) 10 mg, Oral, Every morning   pregabalin (LYRICA) 75 mg, Oral, 3 times daily   saccharomyces boulardii (FLORASTOR) 250 mg, Every morning   senna-docusate (SENOKOT-S) 8.6-50 MG tablet 2 tablets, Oral, Daily at bedtime   topiramate (TOPAMAX) 100 mg, Oral, 2 times daily   traMADol  (ULTRAM ) 50 mg, Oral, Every 8 hours PRN   Vitamin D  (Ergocalciferol ) (DRISDOL) 50,000 Units, Every Mon   zinc  gluconate 50 mg, Oral,  Every morning    Diet Orders (From admission, onward)     Start     Ordered   05/01/24 0001  Diet NPO time specified  Diet effective midnight        04/30/24 1937            DVT prophylaxis: SCDs Start: 04/30/24 2107   Lab Results  Component Value Date   PLT 188 05/01/2024      Code Status: Full Code  Family Communication: No family at bedside  Status is: Inpatient Remains inpatient appropriate because: Severity of illness   Level of care: Med-Surg  Consultants:  Gastroenterology  Objective: Vitals:    04/30/24 1809 04/30/24 1858 04/30/24 2153 05/01/24 0607  BP:  108/64 121/67 118/73  Pulse:  82 81 84  Resp:  16 15 16   Temp: 98.2 F (36.8 C)  98.5 F (36.9 C) 98.4 F (36.9 C)  TempSrc: Oral  Oral Oral  SpO2:  95% 96% 98%  Weight:      Height:        Intake/Output Summary (Last 24 hours) at 05/01/2024 1206 Last data filed at 05/01/2024 0056 Gross per 24 hour  Intake 1044.02 ml  Output --  Net 1044.02 ml   Wt Readings from Last 3 Encounters:  04/30/24 130.2 kg  03/09/15 (!) 167.8 kg  04/10/14 (!) 151.5 kg    Examination:  Constitutional: NAD Eyes: no scleral icterus ENMT: Mucous membranes are moist.  Neck: normal, supple Respiratory: clear to auscultation bilaterally, no wheezing, no crackles. Cardiovascular: Regular rate and rhythm, no murmurs / rubs / gallops. No LE edema.  Abdomen: non distended, no tenderness. Bowel sounds positive.  Musculoskeletal: no clubbing / cyanosis.    Data Reviewed: I have independently reviewed following labs and imaging studies   CBC Recent Labs  Lab 04/30/24 1250 05/01/24 0340  WBC 32.5* 23.6*  HGB 14.3 13.4  HCT 43.0 39.8  PLT 247 188  MCV 98.6 99.3  MCH 32.8 33.4  MCHC 33.3 33.7  RDW 12.3 12.6  LYMPHSABS 1.4  --   MONOABS 2.2*  --   EOSABS 0.0  --   BASOSABS 0.1  --     Recent Labs  Lab 04/30/24 1250 05/01/24 0340  NA 140 139  K 4.0 3.6  CL 108 108  CO2 22 21*  GLUCOSE 88 79  BUN 16 13  CREATININE 0.89 0.77  CALCIUM 9.3 9.1  AST 336* 170*  ALT 654* 474*  ALKPHOS 220* 187*  BILITOT 6.3* 2.5*  ALBUMIN 4.1 3.7  INR  --  1.1    ------------------------------------------------------------------------------------------------------------------ No results for input(s): CHOL, HDL, LDLCALC, TRIG, CHOLHDL, LDLDIRECT in the last 72 hours.  Lab Results  Component Value Date   HGBA1C 4.9 10/10/2013    ------------------------------------------------------------------------------------------------------------------ No results for input(s): TSH, T4TOTAL, T3FREE, THYROIDAB in the last 72 hours.  Invalid input(s): FREET3  Cardiac Enzymes No results for input(s): CKMB, TROPONINI, MYOGLOBIN in the last 168 hours.  Invalid input(s): CK ------------------------------------------------------------------------------------------------------------------ No results found for: BNP  CBG: Recent Labs  Lab 04/30/24 2142  GLUCAP 85    No results found for this or any previous visit (from the past 240 hours).   Radiology Studies: US  Abdomen Limited RUQ (LIVER/GB) Result Date: 04/30/2024 CLINICAL DATA:  Elevated liver function tests EXAM: ULTRASOUND ABDOMEN LIMITED RIGHT UPPER QUADRANT COMPARISON:  CT scan of same day FINDINGS: Gallbladder: WES sign is noted suggesting cholelithiasis, presumably non calcified as they do not appear on CT scan. Mild  gallbladder wall thickening is noted at 4 mm. No sonographic Murphy's sign is noted. Common bile duct: Diameter: 5 mm which is within normal limits. Liver: No focal lesion identified. Within normal limits in parenchymal echogenicity. Portal vein is patent on color Doppler imaging with normal direction of blood flow towards the liver. Other: None. IMPRESSION: Findings consistent with cholelithiasis with mild gallbladder wall thickening at 4 mm. Presumably the stones are noncalcified as they are not clearly visualized on CT scan of same day. If there is clinical concern for cholecystitis, HIDA scan is recommended for further evaluation. Electronically Signed   By: Lynwood Landy Raddle M.D.   On: 04/30/2024 15:30   CT ABDOMEN PELVIS W CONTRAST Result Date: 04/30/2024 CLINICAL DATA:  Mild right upper quadrant abdominal pain. Abnormal liver enzymes. EXAM: CT ABDOMEN AND PELVIS WITH CONTRAST TECHNIQUE: Multidetector CT imaging of the abdomen and  pelvis was performed using the standard protocol following bolus administration of intravenous contrast. RADIATION DOSE REDUCTION: This exam was performed according to the departmental dose-optimization program which includes automated exposure control, adjustment of the mA and/or kV according to patient size and/or use of iterative reconstruction technique. CONTRAST:  75mL OMNIPAQUE  IOHEXOL  350 MG/ML SOLN COMPARISON:  None Available. FINDINGS: Lower chest: No acute abnormality. Hepatobiliary: No focal liver abnormality is seen. No gallstones, gallbladder wall thickening, or biliary dilatation. Pancreas: Unremarkable. No pancreatic ductal dilatation or surrounding inflammatory changes. Spleen: Normal in size without focal abnormality. Adrenals/Urinary Tract: Adrenal glands are unremarkable. Kidneys are normal, without renal calculi, focal lesion, or hydronephrosis. Bladder is unremarkable. Stomach/Bowel: Stomach is within normal limits. Appendix appears normal. No evidence of bowel wall thickening, distention, or inflammatory changes. Vascular/Lymphatic: Aortic atherosclerosis. No enlarged abdominal or pelvic lymph nodes. Reproductive: Prostate is unremarkable. Other: No abdominal wall hernia or abnormality. No abdominopelvic ascites. Musculoskeletal: No acute or significant osseous findings. IMPRESSION: 1. No acute abnormality seen in the abdomen or pelvis. 2. Aortic atherosclerosis. Aortic Atherosclerosis (ICD10-I70.0). Electronically Signed   By: Lynwood Landy Raddle M.D.   On: 04/30/2024 15:20     Nilda Fendt, MD, PhD [facility]  Between 7 am - 7 pm I am available, please contact me via Amion (for emergencies) or Securechat (non urgent messages)  Between 7 pm - 7 am I am not available, please contact night coverage MD/APP via Amion  "

## 2024-05-02 ENCOUNTER — Inpatient Hospital Stay (HOSPITAL_COMMUNITY): Admitting: Anesthesiology

## 2024-05-02 ENCOUNTER — Encounter (HOSPITAL_COMMUNITY): Payer: Self-pay | Admitting: Internal Medicine

## 2024-05-02 ENCOUNTER — Encounter (HOSPITAL_COMMUNITY)
Admission: EM | Disposition: A | Discharge status: Skilled Nursing Facility | Payer: Self-pay | Source: Skilled Nursing Facility | Attending: Internal Medicine

## 2024-05-02 DIAGNOSIS — K8000 Calculus of gallbladder with acute cholecystitis without obstruction: Secondary | ICD-10-CM | POA: Diagnosis not present

## 2024-05-02 DIAGNOSIS — R748 Abnormal levels of other serum enzymes: Secondary | ICD-10-CM | POA: Diagnosis not present

## 2024-05-02 HISTORY — PX: INDOCYANINE GREEN FLUORESCENCE IMAGING (ICG): SHX7595

## 2024-05-02 HISTORY — PX: CHOLECYSTECTOMY: SHX55

## 2024-05-02 LAB — CBC
HCT: 38.8 % — ABNORMAL LOW (ref 39.0–52.0)
Hemoglobin: 13.2 g/dL (ref 13.0–17.0)
MCH: 32.7 pg (ref 26.0–34.0)
MCHC: 34 g/dL (ref 30.0–36.0)
MCV: 96 fL (ref 80.0–100.0)
Platelets: 214 K/uL (ref 150–400)
RBC: 4.04 MIL/uL — ABNORMAL LOW (ref 4.22–5.81)
RDW: 12.5 % (ref 11.5–15.5)
WBC: 12.5 K/uL — ABNORMAL HIGH (ref 4.0–10.5)
nRBC: 0 % (ref 0.0–0.2)

## 2024-05-02 LAB — COMPREHENSIVE METABOLIC PANEL WITH GFR
ALT: 312 U/L — ABNORMAL HIGH (ref 0–44)
AST: 61 U/L — ABNORMAL HIGH (ref 15–41)
Albumin: 3.7 g/dL (ref 3.5–5.0)
Alkaline Phosphatase: 154 U/L — ABNORMAL HIGH (ref 38–126)
Anion gap: 13 (ref 5–15)
BUN: 13 mg/dL (ref 6–20)
CO2: 20 mmol/L — ABNORMAL LOW (ref 22–32)
Calcium: 9.1 mg/dL (ref 8.9–10.3)
Chloride: 109 mmol/L (ref 98–111)
Creatinine, Ser: 0.78 mg/dL (ref 0.61–1.24)
GFR, Estimated: >60 mL/min
Glucose, Bld: 107 mg/dL — ABNORMAL HIGH (ref 70–99)
Potassium: 3.4 mmol/L — ABNORMAL LOW (ref 3.5–5.1)
Sodium: 142 mmol/L (ref 135–145)
Total Bilirubin: 1.5 mg/dL — ABNORMAL HIGH (ref 0.0–1.2)
Total Protein: 6.3 g/dL — ABNORMAL LOW (ref 6.5–8.1)

## 2024-05-02 LAB — GLUCOSE, CAPILLARY
Glucose-Capillary: 105 mg/dL — ABNORMAL HIGH (ref 70–99)
Glucose-Capillary: 120 mg/dL — ABNORMAL HIGH (ref 70–99)

## 2024-05-02 LAB — MAGNESIUM: Magnesium: 2 mg/dL (ref 1.7–2.4)

## 2024-05-02 SURGERY — LAPAROSCOPIC CHOLECYSTECTOMY
Anesthesia: General | Site: Abdomen

## 2024-05-02 MED ORDER — INDOCYANINE GREEN 25 MG IJ SOLR
2.5000 mg | Freq: Once | INTRAMUSCULAR | Status: AC
  Administered 2024-05-02: 2.5 mg via INTRAVENOUS

## 2024-05-02 MED ORDER — OXYCODONE HCL 5 MG/5ML PO SOLN: 5.0000 mg | Freq: Once | ORAL | Status: DC | PRN

## 2024-05-02 MED ORDER — MIDAZOLAM HCL 2 MG/2ML IJ SOLN
INTRAMUSCULAR | Status: AC
  Filled 2024-05-02: qty 2

## 2024-05-02 MED ORDER — HYDROCORTISONE SOD SUC (PF) 100 MG IJ SOLR
INTRAMUSCULAR | Status: DC | PRN
  Administered 2024-05-02: 250 mg via INTRAVENOUS

## 2024-05-02 MED ORDER — LACTATED RINGERS IV SOLN: INTRAVENOUS | Status: DC | PRN

## 2024-05-02 MED ORDER — FENTANYL CITRATE (PF) 100 MCG/2ML IJ SOLN
25.0000 ug | INTRAMUSCULAR | Status: DC | PRN
  Administered 2024-05-02: 25 ug via INTRAVENOUS

## 2024-05-02 MED ORDER — ONDANSETRON HCL 4 MG/2ML IJ SOLN: 4.0000 mg | Freq: Four times a day (QID) | INTRAMUSCULAR | Status: DC | PRN

## 2024-05-02 MED ORDER — DEXMEDETOMIDINE HCL IN NACL 400 MCG/100ML IV SOLN
INTRAVENOUS | Status: DC | PRN
  Administered 2024-05-02: 12 ug via INTRAVENOUS

## 2024-05-02 MED ORDER — FENTANYL CITRATE (PF) 250 MCG/5ML IJ SOLN
INTRAMUSCULAR | Status: AC
  Filled 2024-05-02: qty 5

## 2024-05-02 MED ORDER — SODIUM CHLORIDE 0.9 % IR SOLN
Status: DC | PRN
  Administered 2024-05-02: 1000 mL

## 2024-05-02 MED ORDER — 0.9 % SODIUM CHLORIDE (POUR BTL) OPTIME
TOPICAL | Status: DC | PRN
  Administered 2024-05-02: 1000 mL

## 2024-05-02 MED ORDER — OXYCODONE HCL 5 MG PO TABS: 5.0000 mg | ORAL_TABLET | Freq: Once | ORAL | Status: DC | PRN

## 2024-05-02 MED ORDER — BUPIVACAINE-EPINEPHRINE 0.25% -1:200000 IJ SOLN
INTRAMUSCULAR | Status: DC | PRN
  Administered 2024-05-02: 17 mL

## 2024-05-02 MED ORDER — BUPIVACAINE-EPINEPHRINE (PF) 0.25% -1:200000 IJ SOLN
INTRAMUSCULAR | Status: AC
  Filled 2024-05-02: qty 30

## 2024-05-02 MED ORDER — PROPOFOL 10 MG/ML IV BOLUS
INTRAVENOUS | Status: AC
  Filled 2024-05-02: qty 20

## 2024-05-02 MED ORDER — PROPOFOL 10 MG/ML IV BOLUS
INTRAVENOUS | Status: DC | PRN
  Administered 2024-05-02: 160 mg via INTRAVENOUS

## 2024-05-02 MED ORDER — FENTANYL CITRATE (PF) 250 MCG/5ML IJ SOLN
INTRAMUSCULAR | Status: DC | PRN
  Administered 2024-05-02: 100 ug via INTRAVENOUS

## 2024-05-02 MED ORDER — ROCURONIUM BROMIDE 10 MG/ML (PF) SYRINGE
PREFILLED_SYRINGE | INTRAVENOUS | Status: DC | PRN
  Administered 2024-05-02: 70 mg via INTRAVENOUS
  Administered 2024-05-02: 10 mg via INTRAVENOUS

## 2024-05-02 MED ORDER — FENTANYL CITRATE (PF) 100 MCG/2ML IJ SOLN
INTRAMUSCULAR | Status: AC
  Filled 2024-05-02: qty 2

## 2024-05-02 SURGICAL SUPPLY — 31 items
BAG COUNTER SPONGE SURGICOUNT (BAG) ×1 IMPLANT
BLADE CLIPPER SURG (BLADE) IMPLANT
CANISTER SUCTION 3000ML PPV (SUCTIONS) ×1 IMPLANT
CHLORAPREP W/TINT 26 (MISCELLANEOUS) ×1 IMPLANT
CLIP APPLIE 5 13 M/L LIGAMAX5 (MISCELLANEOUS) ×1 IMPLANT
COVER SURGICAL LIGHT HANDLE (MISCELLANEOUS) ×1 IMPLANT
DERMABOND ADVANCED .7 DNX12 (GAUZE/BANDAGES/DRESSINGS) ×1 IMPLANT
ELECTRODE REM PT RTRN 9FT ADLT (ELECTROSURGICAL) ×1 IMPLANT
GLOVE BIO SURGEON STRL SZ7.5 (GLOVE) ×1 IMPLANT
GLOVE INDICATOR 8.0 STRL GRN (GLOVE) ×1 IMPLANT
GOWN STRL REUS W/ TWL LRG LVL3 (GOWN DISPOSABLE) ×2 IMPLANT
GOWN STRL REUS W/ TWL XL LVL3 (GOWN DISPOSABLE) ×1 IMPLANT
IRRIGATION SUCT STRKRFLW 2 WTP (MISCELLANEOUS) ×1 IMPLANT
KIT BASIN OR (CUSTOM PROCEDURE TRAY) ×1 IMPLANT
KIT IMAGING PINPOINTPAQ (MISCELLANEOUS) IMPLANT
KIT TURNOVER KIT B (KITS) ×1 IMPLANT
PAD ARMBOARD POSITIONER FOAM (MISCELLANEOUS) ×1 IMPLANT
PENCIL BUTTON HOLSTER BLD 10FT (ELECTRODE) ×1 IMPLANT
SCISSORS LAP 5X35 DISP (ENDOMECHANICALS) ×1 IMPLANT
SET TUBE SMOKE EVAC HIGH FLOW (TUBING) ×1 IMPLANT
SLEEVE ADV FIXATION 5X100MM (TROCAR) ×2 IMPLANT
SOLN 0.9% NACL POUR BTL 1000ML (IV SOLUTION) ×1 IMPLANT
SOLN STERILE WATER BTL 1000 ML (IV SOLUTION) ×1 IMPLANT
SUT MNCRL AB 4-0 PS2 18 (SUTURE) ×1 IMPLANT
SUT VICRYL 0 UR6 27IN ABS (SUTURE) IMPLANT
SYSTEM BAG RETRIEVAL 10MM (BASKET) IMPLANT
TOWEL GREEN STERILE FF (TOWEL DISPOSABLE) ×1 IMPLANT
TRAY LAPAROSCOPIC MC (CUSTOM PROCEDURE TRAY) ×1 IMPLANT
TROCAR ADV FIXATION 5X100MM (TROCAR) ×1 IMPLANT
TROCAR BALLN 12MMX100 BLUNT (TROCAR) ×1 IMPLANT
WARMER LAPAROSCOPE (MISCELLANEOUS) ×1 IMPLANT

## 2024-05-02 NOTE — Progress Notes (Signed)
 Patient ID: Bobby Stafford, male   DOB: 1976-02-24, 49 y.o.   MRN: [id number]    Progress Note   Subjective   Day # 2 CC; acute right upper quadrant pain, elevated LFTs, leukocytosis-patient with multiple sclerosis with paraparesis  IV Zosyn  Tmax 99.4  MRI/MRCP-numerous tiny gallstones with equivocal imaging findings for cholecystitis possible early acute cholecystitis versus chronic cholecystitis, no intrahepatic or extrahepatic ductal dilation, somewhat motion limited MRCP but no evidence of choledocholithiasis, consider repeat imaging if ongoing concern for biliary obstruction  WBC 32.5> 23.6> 12.5 Today-T. bili 1.5/alk phos 154/AST 61/ALT 312 improved   Objective   Vital signs in last 24 hours: Temp:  [97.8 F (36.6 C)-99.4 F (37.4 C)] 97.8 F (36.6 C) (01/01 0647) Pulse Rate:  [72-96] 72 (01/01 0647) Resp:  [14-17] 15 (01/01 0647) BP: (117-131)/(64-83) 124/83 (01/01 0647) SpO2:  [93 %-97 %] 97 % (01/01 0647) Last BM Date : 05/01/24 General:    African-American male in NAD Heart:  Regular rate and rhythm; no murmurs Lungs: Respirations even and unlabored, lungs CTA bilaterally Abdomen:  Soft, nontender and nondistended. Normal bowel sounds. Extremities:  Without edema. Neurologic:  Alert and oriented,  grossly normal neurologically. Psych:  Cooperative. Normal mood and affect.  Intake/Output from previous day: 12/31 0701 - 01/01 0700 In: -  Out: 350 [Urine:350] Intake/Output this shift: No intake/output data recorded.  Lab Results: Recent Labs    04/30/24 1250 05/01/24 0340 05/02/24 0506  WBC 32.5* 23.6* 12.5*  HGB 14.3 13.4 13.2  HCT 43.0 39.8 38.8*  PLT 247 188 214   BMET Recent Labs    04/30/24 1250 05/01/24 0340 05/02/24 0506  NA 140 139 142  K 4.0 3.6 3.4*  CL 108 108 109  CO2 22 21* 20*  GLUCOSE 88 79 107*  BUN 16 13 13   CREATININE 0.89 0.77 0.78  CALCIUM 9.3 9.1 9.1   LFT Recent Labs    05/02/24 0506  PROT 6.3*  ALBUMIN 3.7  AST 61*   ALT 312*  ALKPHOS 154*  BILITOT 1.5*   PT/INR Recent Labs    05/01/24 0340  LABPROT 15.2  INR 1.1    Studies/Results: MR ABDOMEN MRCP W WO CONTRAST Result Date: 05/01/2024 EXAM: MRCP WITH AND WITHOUT IV CONTRAST 05/01/2024 02:47:00 PM TECHNIQUE: Multisequence, multiplanar magnetic resonance images of the abdomen with and without intravenous contrast. MRCP sequences were performed. Contrast: 10 cc gadovist COMPARISON: US  abdomen limited 04/30/2024 and CT abdomen and pelvis 04/30/2024. CLINICAL HISTORY: Epigastric pain; 885157 Epigastric pain 114842; Cholelithiasis probable cholecystitis, elevated LFTs. FINDINGS: LIVER: Unremarkable. GALLBLADDER AND BILIARY SYSTEM: Numerous tiny stones are identified within the gallbladder measuring up to 4 mm. There is equivocal wall thickening with diffuse gallbladder wall enhancement. Gallbladder wall thickness measures upper limits of normal at 3 mm. No pericholecystic fluid or soft tissue stranding. No intrahepatic bile duct dilatation. The common bile duct is normal in caliber measuring 5 mm, image 19/5. MRCP images are motion limited without signs of choledocholithiasis. SPLEEN: The spleen is within normal limits in size and appearance. PANCREAS/PANCREATIC DUCT: The pancreas is normal in size and contour without a focal lesion or ductal dilatation. ADRENAL GLANDS: Normal size and morphology bilaterally. No nodule, thickening, or hemorrhage. No periadrenal stranding. KIDNEYS: Unremarkable. LYMPH NODES: No enlarged abdominal lymph nodes. VASCULATURE: Unremarkable. PERITONEUM: No ascites. ABDOMINAL WALL: No hernia. No mass. BOWEL: Grossly unremarkable. No bowel obstruction. BONES: No acute abnormality or worrisome osseous lesion. SOFT TISSUES: Unremarkable. MISCELLANEOUS: Unremarkable. IMPRESSION: 1. Numerous tiny gallstones with  equivocal imaging findings for acute cholecystitis, with differential considerations including early acute cholecystitis versus chronic  cholecystitis. 2. No intrahepatic or extrahepatic bile duct dilatation. 3. Motion-limited MRCP without choledocholithiasis identified; if ongoing concern for biliary obstruction, consider repeat MRCP when able or endoscopic ultrasound or ERCP. Electronically signed by: Waddell Calk MD 05/01/2024 03:37 PM EST RP Workstation: [id number]   MR 3D Recon At Scanner Result Date: 05/01/2024 EXAM: MRCP WITH AND WITHOUT IV CONTRAST 05/01/2024 02:47:00 PM TECHNIQUE: Multisequence, multiplanar magnetic resonance images of the abdomen with and without intravenous contrast. MRCP sequences were performed. Contrast: 10 cc gadovist COMPARISON: US  abdomen limited 04/30/2024 and CT abdomen and pelvis 04/30/2024. CLINICAL HISTORY: Epigastric pain; 885157 Epigastric pain 114842; Cholelithiasis probable cholecystitis, elevated LFTs. FINDINGS: LIVER: Unremarkable. GALLBLADDER AND BILIARY SYSTEM: Numerous tiny stones are identified within the gallbladder measuring up to 4 mm. There is equivocal wall thickening with diffuse gallbladder wall enhancement. Gallbladder wall thickness measures upper limits of normal at 3 mm. No pericholecystic fluid or soft tissue stranding. No intrahepatic bile duct dilatation. The common bile duct is normal in caliber measuring 5 mm, image 19/5. MRCP images are motion limited without signs of choledocholithiasis. SPLEEN: The spleen is within normal limits in size and appearance. PANCREAS/PANCREATIC DUCT: The pancreas is normal in size and contour without a focal lesion or ductal dilatation. ADRENAL GLANDS: Normal size and morphology bilaterally. No nodule, thickening, or hemorrhage. No periadrenal stranding. KIDNEYS: Unremarkable. LYMPH NODES: No enlarged abdominal lymph nodes. VASCULATURE: Unremarkable. PERITONEUM: No ascites. ABDOMINAL WALL: No hernia. No mass. BOWEL: Grossly unremarkable. No bowel obstruction. BONES: No acute abnormality or worrisome osseous lesion. SOFT TISSUES: Unremarkable.  MISCELLANEOUS: Unremarkable. IMPRESSION: 1. Numerous tiny gallstones with equivocal imaging findings for acute cholecystitis, with differential considerations including early acute cholecystitis versus chronic cholecystitis. 2. No intrahepatic or extrahepatic bile duct dilatation. 3. Motion-limited MRCP without choledocholithiasis identified; if ongoing concern for biliary obstruction, consider repeat MRCP when able or endoscopic ultrasound or ERCP. Electronically signed by: Waddell Calk MD 05/01/2024 03:37 PM EST RP Workstation: [id number]   US  Abdomen Limited RUQ (LIVER/GB) Result Date: 04/30/2024 CLINICAL DATA:  Elevated liver function tests EXAM: ULTRASOUND ABDOMEN LIMITED RIGHT UPPER QUADRANT COMPARISON:  CT scan of same day FINDINGS: Gallbladder: WES sign is noted suggesting cholelithiasis, presumably non calcified as they do not appear on CT scan. Mild gallbladder wall thickening is noted at 4 mm. No sonographic Murphy's sign is noted. Common bile duct: Diameter: 5 mm which is within normal limits. Liver: No focal lesion identified. Within normal limits in parenchymal echogenicity. Portal vein is patent on color Doppler imaging with normal direction of blood flow towards the liver. Other: None. IMPRESSION: Findings consistent with cholelithiasis with mild gallbladder wall thickening at 4 mm. Presumably the stones are noncalcified as they are not clearly visualized on CT scan of same day. If there is clinical concern for cholecystitis, HIDA scan is recommended for further evaluation. Electronically Signed   By: Lynwood Landy Raddle M.D.   On: 04/30/2024 15:30   CT ABDOMEN PELVIS W CONTRAST Result Date: 04/30/2024 CLINICAL DATA:  Mild right upper quadrant abdominal pain. Abnormal liver enzymes. EXAM: CT ABDOMEN AND PELVIS WITH CONTRAST TECHNIQUE: Multidetector CT imaging of the abdomen and pelvis was performed using the standard protocol following bolus administration of intravenous contrast. RADIATION DOSE  REDUCTION: This exam was performed according to the departmental dose-optimization program which includes automated exposure control, adjustment of the mA and/or kV according to patient size and/or use of iterative reconstruction technique. CONTRAST:  75mL OMNIPAQUE  IOHEXOL  350 MG/ML SOLN COMPARISON:  None Available. FINDINGS: Lower chest: No acute abnormality. Hepatobiliary: No focal liver abnormality is seen. No gallstones, gallbladder wall thickening, or biliary dilatation. Pancreas: Unremarkable. No pancreatic ductal dilatation or surrounding inflammatory changes. Spleen: Normal in size without focal abnormality. Adrenals/Urinary Tract: Adrenal glands are unremarkable. Kidneys are normal, without renal calculi, focal lesion, or hydronephrosis. Bladder is unremarkable. Stomach/Bowel: Stomach is within normal limits. Appendix appears normal. No evidence of bowel wall thickening, distention, or inflammatory changes. Vascular/Lymphatic: Aortic atherosclerosis. No enlarged abdominal or pelvic lymph nodes. Reproductive: Prostate is unremarkable. Other: No abdominal wall hernia or abnormality. No abdominopelvic ascites. Musculoskeletal: No acute or significant osseous findings. IMPRESSION: 1. No acute abnormality seen in the abdomen or pelvis. 2. Aortic atherosclerosis. Aortic Atherosclerosis (ICD10-I70.0). Electronically Signed   By: Lynwood Landy Raddle M.D.   On: 04/30/2024 15:20       Assessment / Plan:         Principal Problem:   Elevated liver enzymes Active Problems:   MS (multiple sclerosis)   Paraparesis (HCC)   Right upper quadrant abdominal pain   Calculus of gallbladder without cholecystitis without obstruction   Hyperbilirubinemia     LOS: 2 days   Mirtha Jain EsterwoodPA-C  05/02/2024, 8:20 AM

## 2024-05-02 NOTE — Anesthesia Preprocedure Evaluation (Signed)
"                                    Anesthesia Evaluation  Patient identified by MRN, date of birth, ID band Patient awake    Reviewed: Allergy & Precautions, H&P , NPO status , Patient's Chart, lab work & pertinent test results  Airway Mallampati: II   Neck ROM: full    Dental   Pulmonary Current Smoker   breath sounds clear to auscultation       Cardiovascular negative cardio ROS  Rhythm:regular Rate:Normal     Neuro/Psych Multiple sclerosis    GI/Hepatic cholecystitis   Endo/Other    Renal/GU      Musculoskeletal  (+) Arthritis ,    Abdominal   Peds  Hematology   Anesthesia Other Findings   Reproductive/Obstetrics                              Anesthesia Physical Anesthesia Plan  ASA: 2  Anesthesia Plan: General   Post-op Pain Management:    Induction: Intravenous  PONV Risk Score and Plan: 1 and Ondansetron, Dexamethasone and Treatment may vary due to age or medical condition  Airway Management Planned: Oral ETT  Additional Equipment:   Intra-op Plan:   Post-operative Plan: Extubation in OR  Informed Consent: I have reviewed the patients History and Physical, chart, labs and discussed the procedure including the risks, benefits and alternatives for the proposed anesthesia with the patient or authorized representative who has indicated his/her understanding and acceptance.     Dental advisory given  Plan Discussed with: CRNA, Anesthesiologist and Surgeon  Anesthesia Plan Comments:         Anesthesia Quick Evaluation  "

## 2024-05-02 NOTE — Op Note (Signed)
 05/02/2024 12:27 PM  PATIENT: Bobby Stafford  49 y.o. male  Patient Care Team: Patient, No Pcp Per as PCP - General (General Practice)  PRE-OPERATIVE DIAGNOSIS: Acute cholecystitis, possible recent choledocholithiasis  POST-OPERATIVE DIAGNOSIS: Same  PROCEDURE: Laparoscopic cholecystectomy with indocyanine green cholangiography   SURGEON: Lonni Pizza, MD  ASSISTANT: OR Staff  ANESTHESIA: General endotracheal  EBL: 10 mL  DRAINS: None  SPECIMEN: Gallbladder  COUNTS: Sponge, needle and instrument counts were reported correct x2 at the conclusion of the operation  DISPOSITION: PACU in satisfactory condition  COMPLICATIONS: none  FINDINGS: Mildly inflamed appearing gallbladder.  Very thin wall particular on the hepatocystic plate.  Friable tissues including cystic duct could be secondary to steroid use.  We felt that advancement of the cholangiocatheter in the setting with friable tissues increase the risk for loss of control of the cystic duct stump and/or even backwall injury to the common bile duct and therefore a formal cholangiogram was not obtained.  ICG cholangiography did demonstrate uptake by the liver and excretion into the biliary system with filling of the gallbladder and additionally faint tracer seen to the wall of the duodenum consistent with a at least patent biliary system.  Critical view of safety was achieved prior to clipping or dividing any structures.  DESCRIPTION:  The patient was seen in the pre-op holding area. The risks, benefits, complications, treatment options, and expected outcomes were previously discussed with the patient. The patient agreed with the proposed plan and has signed the informed consent form. The patient was brought to the operating room by the surgical team, identified as Bobby Stafford, and the procedure verified. placed supine on the operating table and SCD's were applied. General anesthesia was induced without difficulty. He was  positioned supine on the operating table.  Pressure points were evaluated and padded. Hair on the abdomen was clipped.  He was secured to the operating table. The abdomen was then prepped and draped in the standard sterile fashion. A time out was completed and the above information confirmed and need for preoperative antibiotics.  A periumbilical incision was made. The umbilical stalk was grasped and retracted outwardly. The supraumbilical fascia was identified and incised. The peritoneal cavity was gently entered bluntly. A purse-string 0 Vicryl suture was placed. The Hasson cannula was inserted into the peritoneal cavity and insufflation with CO2 commenced to . A laparoscope was inserted into the peritoneal cavity and inspection confirmed no evidence of trocar site complications. The patient was then positioned in reverse Trendelenburg with slight left side down. 3 additional 5 mm trocars were placed along the right subcostal line - one 5mm port in mid subcostal region, another 5mm port in the right flank near the anterior axillary line, and a third 5mm port in the left subxiphoid region obliquely near the falciform ligament.  The liver and gallbladder were inspected.  There is mild inflammation about the gallbladder consistent with cholecystitis.  The liver was otherwise normal. The gallbladder fundus was grasped and elevated cephalad. An additional grasper was then placed on the infundibulum of the gallbladder and the infundibulum was retracted laterally. Staying high on the gallbladder, the peritoneum on both sides of the gallbladder was opened with hook cautery. Gentle blunt dissection was then employed with a [place]  dissector working down into [employer]. The cystic duct was identified and carefully circumferentially dissected. The cystic artery was also identified and carefully circumferentially dissected. The space between the cystic artery and hepatocystic plate was developed such that  a good view of  the liver could be seen through a window medial to the cystic artery. The triangle of Calot had been cleared of all fibrofatty tissue. At this point, a critical view of safety was achieved and the only structures visualized was the skeletonized cystic duct laterally, the skeletonized cystic artery and the liver through the window medial to the artery. No posterior cystic artery was noted.  Tissue quality is quite fragile that I suspect could be secondary to chronic steroid use.  There was concern that with advancement of the cholangiocatheter, there would be an increased risk for loss of control of the cystic duct and additionally potential backwall injuries to the common bile duct and therefore a cholangiocatheter was not passed.  Under near-infrared light, indocyanine green cholangiography does demonstrate uptake of ICG by the liver with excretion into the biliary system.  Faint tracer activity is seen extending down the hepatoduodenal ligament at the location of the presumed common bile duct with backfilling of the cystic duct and gallbladder.  Faint tracer activity is seen through the wall of the duodenum consistent with a patent biliary system.  No ICG activity is seen through our candidate cystic artery.  The cystic duct and artery were clipped with 2 clips on the patient side and 1 clip on the specimen side. The cystic duct and artery were then divided. The gallbladder was then freed from its remaining attachments to the liver using electrocautery and placed into an endocatch bag.  Gallbladder was rather intrahepatic on the hepatocystic plate.  There are multiple areas where the wall which is thin and fragile was entered inadvertently and there was some spillage of bile and stones.  All stones were then placed into an Endobag as well that had been spilled.  The RUQ was gently irrigated with sterile saline. Hemostasis was then verified. The clips were in good position; the gallbladder fossa  was dry. The rest of the abdomen was inspected no injury nor bleeding elsewhere was identified.  No retained stones are identified with tedious reinspection of the right upper quadrant and central abdomen.  The endocatch bag containing the gallbladder and gallstones was then removed from the umbilical port site and passed off as specimen. The RUQ ports were removed under direct visualization and noted to be hemostatic. The umbilical fascia was then closed using the 0 Vicryl purse-string suture. The fascia was palpated and noted to be completely closed. The skin of all incision sites was approximated with 4-0 monocryl subcuticular suture and dermabond applied. He was then awakened from anesthesia, extubated, and transferred to a stretcher for transport to PACU in satisfactory condition.

## 2024-05-02 NOTE — Progress Notes (Signed)
 Reached out to on call provider Raenelle Horns, NP) as patient is unable to hold the urinal or to even get it in place to use it. He is a large man and has MS which makes it difficult for him. He states that he usually wears briefs at home. He is not ambulatory and can use the bedpan for BMs, but not when voiding. We were given permission to use the male pure wick for tonight.

## 2024-05-02 NOTE — Progress Notes (Signed)
 I have tried to reach his mother per his request preop to update her on his status and having done well with surgery. I am unable to locate her nor reach her on her cell phone with the number provided.  We reached a nonworking number.  Lonni Pizza, MD [facility]

## 2024-05-02 NOTE — Evaluation (Signed)
 Physical Therapy Brief Evaluation and Discharge Note Patient Details Name: Bobby Stafford MRN: [id number] DOB: 04-11-1976 Today's Date: 05/02/2024   History of Present Illness  49 yo M adm 04/30/24 from [facility] with abnormal labs, RUQ pain, cholelithiasis. PMhx: multiple sclerosis, paraparesis, nystagmus, obesity  Clinical Impression  Pt at baseline is a LTC resident with assist for all ADLs/IADLs. Pt is a total lift to WC at baseline and is able to assist with rolling in bed. Pt performed bed mobility for pericare and linen change with assist of rail and max assist, able to lift arms help with changing clothes and is at baseline functional mobility. Will defer all needs to LTC.        PT Assessment Patient does not need any further PT services  Assistance Needed at Discharge  Frequent or constant Supervision/Assistance    Equipment Recommendations None recommended by PT  Recommendations for Other Services       Precautions/Restrictions Precautions Precautions: Fall        Mobility  Bed Mobility Rolling: Max assist     General bed mobility comments: max assist to roll bil for pericare and linen change, max assist to slide to [facility]  Transfers                        Ambulation/Gait              Home Activity Instructions    Stairs            Modified Rankin (Stroke Patients Only)        Balance                          Pertinent Vitals/Pain PT - Brief Vital Signs All Vital Signs Stable: Yes Pain Assessment Pain Assessment: No/denies pain     Home Living Family/patient expects to be discharged to:: Skilled nursing facility                  Prior Function Level of Independence: Needs assistance Comments: assists with rolling, hoyer lift from bed to [facility], staff propels WC. Pt can self feed but otherwise total assist for all ADL/IADL    UE/LE Assessment   UE ROM/Strength/Tone/Coordination: Generalized weakness  (grossly 2/5 functional strength UB, able to assist with grasping rails to roll and pull toward HOB)    LE ROM/Strength/Tone/Coordination: Impaired LE ROM/Strength/Tone/Coordination Deficits: no active ROM, spasms with mobiltiy    Communication   Communication Communication: No apparent difficulties     Cognition Overall Cognitive Status: Appears within functional limits for tasks assessed/performed       General Comments      Exercises     Assessment/Plan    PT Problem List         PT Visit Diagnosis Other abnormalities of gait and mobility (R26.89)    No Skilled PT All education completed;Patient at baseline level of functioning;Patient will have necessary level of assist by caregiver at discharge   Co-evaluation                AMPAC 6 Clicks Help needed turning from your back to your side while in a flat bed without using bedrails?: Total Help needed moving from lying on your back to sitting on the side of a flat bed without using bedrails?: Total Help needed moving to and from a bed to a chair (including a wheelchair)?: Total Help needed standing up from a chair  using your arms (e.g., wheelchair or bedside chair)?: Total Help needed to walk in hospital room?: Total Help needed climbing 3-5 steps with a railing? : Total 6 Click Score: 6      End of Session   Activity Tolerance: Patient tolerated treatment well Patient left: in bed;with call bell/phone within reach Nurse Communication: Mobility status;Need for lift equipment PT Visit Diagnosis: Other abnormalities of gait and mobility (R26.89)     Time: 9178-9163 PT Time Calculation (min) (ACUTE ONLY): 15 min  Charges:   PT Evaluation $PT Eval Low Complexity: 1 Low      Ehan Freas P, PT Acute Rehabilitation Services Office: [phone]   L[place] KATHEE Docker  05/02/2024, 8:58 AM

## 2024-05-02 NOTE — Transfer of Care (Signed)
 Immediate Anesthesia Transfer of Care Note  Patient: Bobby Stafford  Procedure(s) Performed: LAPAROSCOPIC CHOLECYSTECTOMY (Abdomen) INDOCYANINE GREEN FLUORESCENCE IMAGING (ICG) (Abdomen)  Patient Location: PACU  Anesthesia Type:General  Level of Consciousness: awake and alert   Airway & Oxygen Therapy: Patient Spontanous Breathing and Patient connected to nasal cannula oxygen  Post-op Assessment: Report given to RN and Post -op Vital signs reviewed and stable  Post vital signs: Reviewed and stable  Last Vitals:  Vitals Value Taken Time  BP 118/78   Temp    Pulse 79 05/02/24 12:32  Resp 16   SpO2 95 % 05/02/24 12:32  Vitals shown include unfiled device data.  Last Pain:  Vitals:   05/02/24 0935  TempSrc: Oral  PainSc: 0-No pain         Complications: No notable events documented.

## 2024-05-02 NOTE — Consult Note (Signed)
 "  CC/Reason for consult: Recent suspected choledocholithiasis  HPI: Bobby Stafford is an 49 y.o. male with hx of multiple sclerosis, presented to the hospital from the nursing home where he currently resides for his MS for evaluation of right upper quadrant pain.  He underwent workup in the emergency department:  WBC 32 AST/ALT 336, 664 Tbili 6.3 Alk phos 220  CT A/P 04/30/24 1. No acute abnormality seen in the abdomen or pelvis. 2. Aortic atherosclerosis.  RUQ US  04/30/24 Findings consistent with cholelithiasis with mild gallbladder wall thickening at 4 mm. Presumably the stones are noncalcified as they are not clearly visualized on CT scan of same day. If there is clinical concern for cholecystitis, HIDA scan is recommended for further evaluation.  He was admitted and started on antibiotics.  MRCP 05/01/24 1. Numerous tiny gallstones with equivocal imaging findings for acute cholecystitis, with differential considerations including early acute cholecystitis versus chronic cholecystitis. 2. No intrahepatic or extrahepatic bile duct dilatation. 3. Motion-limited MRCP without choledocholithiasis identified; if ongoing concern for biliary obstruction, consider repeat MRCP when able or endoscopic ultrasound or ERCP.  GI has been following.  His LFTs have been downtrending-   05/02/24 AST/ALT 61/312 Tbili 1.5 Alk phos 154  He reports that his right upper quadrant pain is essentially completely resolved.  He denies any nausea or vomiting.  He has been seen by our GI team and has been recommended cholecystectomy prior to discharge for suspected transient choledocholithiasis.   Past Medical History:  Diagnosis Date   Arthritis    MS (multiple sclerosis)     Past Surgical History:  Procedure Laterality Date   no surgerys      Family History  Problem Relation Age of Onset   Diabetes type I Father     Social:  reports that he has been smoking cigarettes. He has a 12  pack-year smoking history. He has never used smokeless tobacco. He reports current drug use. Frequency: 7.00 times per week. Drug: Marijuana. He reports that he does not drink alcohol.  Allergies: Allergies[1]  Medications: I have reviewed the patient's current medications.  Results for orders placed or performed during the hospital encounter of 04/30/24 (from the past 48 hours)  CBC with Differential     Status: Abnormal   Collection Time: 04/30/24 12:50 PM  Result Value Ref Range   WBC 32.5 (H) 4.0 - 10.5 K/uL   RBC 4.36 4.22 - 5.81 MIL/uL   Hemoglobin 14.3 13.0 - 17.0 g/dL   HCT 56.9 60.9 - 47.9 %   MCV 98.6 80.0 - 100.0 fL   MCH 32.8 26.0 - 34.0 pg   MCHC 33.3 30.0 - 36.0 g/dL   RDW 87.6 88.4 - 84.4 %   Platelets 247 150 - 400 K/uL   nRBC 0.0 0.0 - 0.2 %   Neutrophils Relative % 88 %   Neutro Abs 28.5 (H) 1.7 - 7.7 K/uL   Lymphocytes Relative 4 %   Lymphs Abs 1.4 0.7 - 4.0 K/uL   Monocytes Relative 7 %   Monocytes Absolute 2.2 (H) 0.1 - 1.0 K/uL   Eosinophils Relative 0 %   Eosinophils Absolute 0.0 0.0 - 0.5 K/uL   Basophils Relative 0 %   Basophils Absolute 0.1 0.0 - 0.1 K/uL   WBC Morphology See Note     Comment: >10% Reactive Benign Lymphoctyes   RBC Morphology MORPHOLOGY UNREMARKABLE    Smear Review Normal platelet morphology    Immature Granulocytes 1 %   Abs Immature  Granulocytes 0.36 (H) 0.00 - 0.07 K/uL    Comment: Performed at [facility], [address] [place], [place] [address]  Comprehensive metabolic panel     Status: Abnormal   Collection Time: 04/30/24 12:50 PM  Result Value Ref Range   Sodium 140 135 - 145 mmol/L   Potassium 4.0 3.5 - 5.1 mmol/L   Chloride 108 98 - 111 mmol/L   CO2 22 22 - 32 mmol/L   Glucose, Bld 88 70 - 99 mg/dL    Comment: Glucose reference range applies only to samples taken after fasting for at least 8 hours.   BUN 16 6 - 20 mg/dL   Creatinine, Ser 9.10 0.61 - 1.24 mg/dL    Comment: ICTERUS AT THIS LEVEL MAY AFFECT  RESULT   Calcium 9.3 8.9 - 10.3 mg/dL   Total Protein 7.0 6.5 - 8.1 g/dL   Albumin 4.1 3.5 - 5.0 g/dL   AST 663 (H) 15 - 41 U/L   ALT 654 (H) 0 - 44 U/L   Alkaline Phosphatase 220 (H) 38 - 126 U/L   Total Bilirubin 6.3 (H) 0.0 - 1.2 mg/dL   GFR, Estimated >39 >39 mL/min    Comment: (NOTE) Calculated using the CKD-EPI Creatinine Equation (2021)    Anion gap 10 5 - 15    Comment: Performed at [facility], [address], [place], [place] [address]  Lipase, blood     Status: None   Collection Time: 04/30/24 12:50 PM  Result Value Ref Range   Lipase 18 11 - 51 U/L    Comment: Performed at [facility], [address] [place], [place] [address]  Acetaminophen  level     Status: Abnormal   Collection Time: 04/30/24 12:50 PM  Result Value Ref Range   Acetaminophen  (Tylenol ), Serum <10 (L) 10 - 30 ug/mL    Comment: (NOTE) Toxic concentrations can be more effectively related to post dose interval; >200, >100, and >50 ug/mL serum concentrations correspond to toxic concentrations at 4, 8, and 12 hours post dose, respectively.  Performed at [facility], [address], [place], [place] [address]   CBG monitoring, ED     Status: None   Collection Time: 04/30/24  9:42 PM  Result Value Ref Range   Glucose-Capillary 85 70 - 99 mg/dL    Comment: Glucose reference range applies only to samples taken after fasting for at least 8 hours.  Protime-INR     Status: None   Collection Time: 05/01/24  3:40 AM  Result Value Ref Range   Prothrombin Time 15.2 11.4 - 15.2 seconds   INR 1.1 0.8 - 1.2    Comment: (NOTE) INR goal varies based on device and disease states. Performed at [facility], [address], [place], [place] [address]   Comprehensive metabolic panel     Status: Abnormal   Collection Time: 05/01/24  3:40 AM  Result Value Ref Range   Sodium 139 135 - 145 mmol/L   Potassium 3.6 3.5 - 5.1 mmol/L   Chloride 108 98 - 111 mmol/L   CO2 21 (L) 22 - 32 mmol/L    Glucose, Bld 79 70 - 99 mg/dL    Comment: Glucose reference range applies only to samples taken after fasting for at least 8 hours.   BUN 13 6 - 20 mg/dL   Creatinine, Ser 9.22 0.61 - 1.24 mg/dL   Calcium 9.1 8.9 - 89.6 mg/dL   Total  Protein 6.5 6.5 - 8.1 g/dL   Albumin 3.7 3.5 - 5.0 g/dL   AST 829 (H) 15 - 41 U/L   ALT 474 (H) 0 - 44 U/L   Alkaline Phosphatase 187 (H) 38 - 126 U/L   Total Bilirubin 2.5 (H) 0.0 - 1.2 mg/dL   GFR, Estimated >39 >39 mL/min    Comment: (NOTE) Calculated using the CKD-EPI Creatinine Equation (2021)    Anion gap 11 5 - 15    Comment: Performed at [facility], [address], [place], [place] [address]  CBC     Status: Abnormal   Collection Time: 05/01/24  3:40 AM  Result Value Ref Range   WBC 23.6 (H) 4.0 - 10.5 K/uL   RBC 4.01 (L) 4.22 - 5.81 MIL/uL   Hemoglobin 13.4 13.0 - 17.0 g/dL   HCT 60.1 60.9 - 47.9 %   MCV 99.3 80.0 - 100.0 fL   MCH 33.4 26.0 - 34.0 pg   MCHC 33.7 30.0 - 36.0 g/dL   RDW 87.3 88.4 - 84.4 %   Platelets 188 150 - 400 K/uL   nRBC 0.0 0.0 - 0.2 %    Comment: Performed at [facility], [address], [place], [place] [address]  HIV Antibody (routine testing w rflx)     Status: None   Collection Time: 05/01/24  5:04 PM  Result Value Ref Range   HIV Screen 4th Generation wRfx Non Reactive Non Reactive    Comment: Performed at [facility], [address], [place], [place] [address]  Glucose, capillary     Status: Abnormal   Collection Time: 05/01/24 11:42 PM  Result Value Ref Range   Glucose-Capillary 134 (H) 70 - 99 mg/dL    Comment: Glucose reference range applies only to samples taken after fasting for at least 8 hours.  CBC     Status: Abnormal   Collection Time: 05/02/24  5:06 AM  Result Value Ref Range   WBC 12.5 (H) 4.0 - 10.5 K/uL   RBC 4.04 (L) 4.22 - 5.81 MIL/uL   Hemoglobin 13.2 13.0 - 17.0 g/dL   HCT 61.1 (L) 60.9 - 47.9 %   MCV 96.0 80.0 - 100.0 fL   MCH 32.7 26.0 - 34.0 pg   MCHC 34.0  30.0 - 36.0 g/dL   RDW 87.4 88.4 - 84.4 %   Platelets 214 150 - 400 K/uL   nRBC 0.0 0.0 - 0.2 %    Comment: Performed at [facility], [address], [place], [place] [address]  Comprehensive metabolic panel with GFR     Status: Abnormal   Collection Time: 05/02/24  5:06 AM  Result Value Ref Range   Sodium 142 135 - 145 mmol/L    Comment: Electrolytes repeated to verify    Potassium 3.4 (L) 3.5 - 5.1 mmol/L   Chloride 109 98 - 111 mmol/L   CO2 20 (L) 22 - 32 mmol/L   Glucose, Bld 107 (H) 70 - 99 mg/dL    Comment: Glucose reference range applies only to samples taken after fasting for at least 8 hours.   BUN 13 6 - 20 mg/dL   Creatinine, Ser 9.21 0.61 - 1.24 mg/dL   Calcium 9.1 8.9 - 89.6 mg/dL   Total Protein 6.3 (L) 6.5 - 8.1 g/dL   Albumin 3.7 3.5 - 5.0 g/dL   AST 61 (H) 15 - 41 U/L   ALT 312 (H) 0 - 44 U/L  Alkaline Phosphatase 154 (H) 38 - 126 U/L   Total Bilirubin 1.5 (H) 0.0 - 1.2 mg/dL   GFR, Estimated >39 >39 mL/min    Comment: (NOTE) Calculated using the CKD-EPI Creatinine Equation (2021)    Anion gap 13 5 - 15    Comment: Performed at [facility], [address], [place], [place] [address]  Magnesium     Status: None   Collection Time: 05/02/24  5:06 AM  Result Value Ref Range   Magnesium 2.0 1.7 - 2.4 mg/dL    Comment: Performed at [facility], [address] [place], [place] [address]  Glucose, capillary     Status: Abnormal   Collection Time: 05/02/24  7:51 AM  Result Value Ref Range   Glucose-Capillary 105 (H) 70 - 99 mg/dL    Comment: Glucose reference range applies only to samples taken after fasting for at least 8 hours.    MR ABDOMEN MRCP W WO CONTRAST Result Date: 05/01/2024 EXAM: MRCP WITH AND WITHOUT IV CONTRAST 05/01/2024 02:47:00 PM TECHNIQUE: Multisequence, multiplanar magnetic resonance images of the abdomen with and without intravenous contrast. MRCP sequences were performed. Contrast: 10 cc gadovist COMPARISON: US  abdomen  limited 04/30/2024 and CT abdomen and pelvis 04/30/2024. CLINICAL HISTORY: Epigastric pain; 885157 Epigastric pain 114842; Cholelithiasis probable cholecystitis, elevated LFTs. FINDINGS: LIVER: Unremarkable. GALLBLADDER AND BILIARY SYSTEM: Numerous tiny stones are identified within the gallbladder measuring up to 4 mm. There is equivocal wall thickening with diffuse gallbladder wall enhancement. Gallbladder wall thickness measures upper limits of normal at 3 mm. No pericholecystic fluid or soft tissue stranding. No intrahepatic bile duct dilatation. The common bile duct is normal in caliber measuring 5 mm, image 19/5. MRCP images are motion limited without signs of choledocholithiasis. SPLEEN: The spleen is within normal limits in size and appearance. PANCREAS/PANCREATIC DUCT: The pancreas is normal in size and contour without a focal lesion or ductal dilatation. ADRENAL GLANDS: Normal size and morphology bilaterally. No nodule, thickening, or hemorrhage. No periadrenal stranding. KIDNEYS: Unremarkable. LYMPH NODES: No enlarged abdominal lymph nodes. VASCULATURE: Unremarkable. PERITONEUM: No ascites. ABDOMINAL WALL: No hernia. No mass. BOWEL: Grossly unremarkable. No bowel obstruction. BONES: No acute abnormality or worrisome osseous lesion. SOFT TISSUES: Unremarkable. MISCELLANEOUS: Unremarkable. IMPRESSION: 1. Numerous tiny gallstones with equivocal imaging findings for acute cholecystitis, with differential considerations including early acute cholecystitis versus chronic cholecystitis. 2. No intrahepatic or extrahepatic bile duct dilatation. 3. Motion-limited MRCP without choledocholithiasis identified; if ongoing concern for biliary obstruction, consider repeat MRCP when able or endoscopic ultrasound or ERCP. Electronically signed by: Waddell Calk MD 05/01/2024 03:37 PM EST RP Workstation: [id number]   MR 3D Recon At Scanner Result Date: 05/01/2024 EXAM: MRCP WITH AND WITHOUT IV CONTRAST 05/01/2024  02:47:00 PM TECHNIQUE: Multisequence, multiplanar magnetic resonance images of the abdomen with and without intravenous contrast. MRCP sequences were performed. Contrast: 10 cc gadovist COMPARISON: US  abdomen limited 04/30/2024 and CT abdomen and pelvis 04/30/2024. CLINICAL HISTORY: Epigastric pain; 885157 Epigastric pain 114842; Cholelithiasis probable cholecystitis, elevated LFTs. FINDINGS: LIVER: Unremarkable. GALLBLADDER AND BILIARY SYSTEM: Numerous tiny stones are identified within the gallbladder measuring up to 4 mm. There is equivocal wall thickening with diffuse gallbladder wall enhancement. Gallbladder wall thickness measures upper limits of normal at 3 mm. No pericholecystic fluid or soft tissue stranding. No intrahepatic bile duct dilatation. The common bile duct is normal in caliber measuring 5 mm, image 19/5. MRCP images are motion limited without signs of choledocholithiasis. SPLEEN: The spleen is within normal limits in  size and appearance. PANCREAS/PANCREATIC DUCT: The pancreas is normal in size and contour without a focal lesion or ductal dilatation. ADRENAL GLANDS: Normal size and morphology bilaterally. No nodule, thickening, or hemorrhage. No periadrenal stranding. KIDNEYS: Unremarkable. LYMPH NODES: No enlarged abdominal lymph nodes. VASCULATURE: Unremarkable. PERITONEUM: No ascites. ABDOMINAL WALL: No hernia. No mass. BOWEL: Grossly unremarkable. No bowel obstruction. BONES: No acute abnormality or worrisome osseous lesion. SOFT TISSUES: Unremarkable. MISCELLANEOUS: Unremarkable. IMPRESSION: 1. Numerous tiny gallstones with equivocal imaging findings for acute cholecystitis, with differential considerations including early acute cholecystitis versus chronic cholecystitis. 2. No intrahepatic or extrahepatic bile duct dilatation. 3. Motion-limited MRCP without choledocholithiasis identified; if ongoing concern for biliary obstruction, consider repeat MRCP when able or endoscopic ultrasound or  ERCP. Electronically signed by: Waddell Calk MD 05/01/2024 03:37 PM EST RP Workstation: [id number]   US  Abdomen Limited RUQ (LIVER/GB) Result Date: 04/30/2024 CLINICAL DATA:  Elevated liver function tests EXAM: ULTRASOUND ABDOMEN LIMITED RIGHT UPPER QUADRANT COMPARISON:  CT scan of same day FINDINGS: Gallbladder: WES sign is noted suggesting cholelithiasis, presumably non calcified as they do not appear on CT scan. Mild gallbladder wall thickening is noted at 4 mm. No sonographic Murphy's sign is noted. Common bile duct: Diameter: 5 mm which is within normal limits. Liver: No focal lesion identified. Within normal limits in parenchymal echogenicity. Portal vein is patent on color Doppler imaging with normal direction of blood flow towards the liver. Other: None. IMPRESSION: Findings consistent with cholelithiasis with mild gallbladder wall thickening at 4 mm. Presumably the stones are noncalcified as they are not clearly visualized on CT scan of same day. If there is clinical concern for cholecystitis, HIDA scan is recommended for further evaluation. Electronically Signed   By: Lynwood Landy Raddle M.D.   On: 04/30/2024 15:30   CT ABDOMEN PELVIS W CONTRAST Result Date: 04/30/2024 CLINICAL DATA:  Mild right upper quadrant abdominal pain. Abnormal liver enzymes. EXAM: CT ABDOMEN AND PELVIS WITH CONTRAST TECHNIQUE: Multidetector CT imaging of the abdomen and pelvis was performed using the standard protocol following bolus administration of intravenous contrast. RADIATION DOSE REDUCTION: This exam was performed according to the departmental dose-optimization program which includes automated exposure control, adjustment of the mA and/or kV according to patient size and/or use of iterative reconstruction technique. CONTRAST:  75mL OMNIPAQUE  IOHEXOL  350 MG/ML SOLN COMPARISON:  None Available. FINDINGS: Lower chest: No acute abnormality. Hepatobiliary: No focal liver abnormality is seen. No gallstones, gallbladder wall  thickening, or biliary dilatation. Pancreas: Unremarkable. No pancreatic ductal dilatation or surrounding inflammatory changes. Spleen: Normal in size without focal abnormality. Adrenals/Urinary Tract: Adrenal glands are unremarkable. Kidneys are normal, without renal calculi, focal lesion, or hydronephrosis. Bladder is unremarkable. Stomach/Bowel: Stomach is within normal limits. Appendix appears normal. No evidence of bowel wall thickening, distention, or inflammatory changes. Vascular/Lymphatic: Aortic atherosclerosis. No enlarged abdominal or pelvic lymph nodes. Reproductive: Prostate is unremarkable. Other: No abdominal wall hernia or abnormality. No abdominopelvic ascites. Musculoskeletal: No acute or significant osseous findings. IMPRESSION: 1. No acute abnormality seen in the abdomen or pelvis. 2. Aortic atherosclerosis. Aortic Atherosclerosis (ICD10-I70.0). Electronically Signed   By: Lynwood Landy Raddle M.D.   On: 04/30/2024 15:20    ROS - all of the below systems have been reviewed with the patient and positives are indicated with bold text General: chills, fever or night sweats Eyes: blurry vision or double vision ENT: epistaxis or sore throat Allergy/Immunology: itchy/watery eyes or nasal congestion Hematologic/Lymphatic: bleeding problems, blood clots or swollen lymph nodes Endocrine: temperature intolerance or  unexpected weight changes Breast: new or changing breast lumps or nipple discharge Resp: cough, shortness of breath, or wheezing CV: chest pain or dyspnea on exertion GI: as per HPI GU: dysuria, trouble voiding, or hematuria MSK: joint pain or joint stiffness Neuro: TIA or stroke symptoms Derm: pruritus and skin lesion changes Psych: anxiety and depression  PE Blood pressure 133/83, pulse 66, temperature 98.2 F (36.8 C), temperature source Oral, resp. rate 16, height 6' 4.5 (1.943 m), weight 130.2 kg, SpO2 92%. Constitutional: NAD; conversant Eyes: Moist conjunctiva;  anicteric Lungs: Normal respiratory effort CV: RRR GI: Abd soft, NT/ND; no palpable hepatosplenomegaly Psychiatric: Appropriate affect; alert and oriented x3   Results for orders placed or performed during the hospital encounter of 04/30/24 (from the past 48 hours)  CBC with Differential     Status: Abnormal   Collection Time: 04/30/24 12:50 PM  Result Value Ref Range   WBC 32.5 (H) 4.0 - 10.5 K/uL   RBC 4.36 4.22 - 5.81 MIL/uL   Hemoglobin 14.3 13.0 - 17.0 g/dL   HCT 56.9 60.9 - 47.9 %   MCV 98.6 80.0 - 100.0 fL   MCH 32.8 26.0 - 34.0 pg   MCHC 33.3 30.0 - 36.0 g/dL   RDW 87.6 88.4 - 84.4 %   Platelets 247 150 - 400 K/uL   nRBC 0.0 0.0 - 0.2 %   Neutrophils Relative % 88 %   Neutro Abs 28.5 (H) 1.7 - 7.7 K/uL   Lymphocytes Relative 4 %   Lymphs Abs 1.4 0.7 - 4.0 K/uL   Monocytes Relative 7 %   Monocytes Absolute 2.2 (H) 0.1 - 1.0 K/uL   Eosinophils Relative 0 %   Eosinophils Absolute 0.0 0.0 - 0.5 K/uL   Basophils Relative 0 %   Basophils Absolute 0.1 0.0 - 0.1 K/uL   WBC Morphology See Note     Comment: >10% Reactive Benign Lymphoctyes   RBC Morphology MORPHOLOGY UNREMARKABLE    Smear Review Normal platelet morphology    Immature Granulocytes 1 %   Abs Immature Granulocytes 0.36 (H) 0.00 - 0.07 K/uL    Comment: Performed at [facility], [address], [place], [place] [address]  Comprehensive metabolic panel     Status: Abnormal   Collection Time: 04/30/24 12:50 PM  Result Value Ref Range   Sodium 140 135 - 145 mmol/L   Potassium 4.0 3.5 - 5.1 mmol/L   Chloride 108 98 - 111 mmol/L   CO2 22 22 - 32 mmol/L   Glucose, Bld 88 70 - 99 mg/dL    Comment: Glucose reference range applies only to samples taken after fasting for at least 8 hours.   BUN 16 6 - 20 mg/dL   Creatinine, Ser 9.10 0.61 - 1.24 mg/dL    Comment: ICTERUS AT THIS LEVEL MAY AFFECT RESULT   Calcium 9.3 8.9 - 10.3 mg/dL   Total Protein 7.0 6.5 - 8.1 g/dL   Albumin 4.1 3.5 - 5.0 g/dL   AST 663  (H) 15 - 41 U/L   ALT 654 (H) 0 - 44 U/L   Alkaline Phosphatase 220 (H) 38 - 126 U/L   Total Bilirubin 6.3 (H) 0.0 - 1.2 mg/dL   GFR, Estimated >39 >39 mL/min    Comment: (NOTE) Calculated using the CKD-EPI Creatinine Equation (2021)    Anion gap 10 5 - 15    Comment: Performed at [facility], [address] [place], [place] [address]  Lipase, blood  Status: None   Collection Time: 04/30/24 12:50 PM  Result Value Ref Range   Lipase 18 11 - 51 U/L    Comment: Performed at [facility], [address] [place], [place] [address]  Acetaminophen  level     Status: Abnormal   Collection Time: 04/30/24 12:50 PM  Result Value Ref Range   Acetaminophen  (Tylenol ), Serum <10 (L) 10 - 30 ug/mL    Comment: (NOTE) Toxic concentrations can be more effectively related to post dose interval; >200, >100, and >50 ug/mL serum concentrations correspond to toxic concentrations at 4, 8, and 12 hours post dose, respectively.  Performed at [facility], [address] [place], [place] [address]   CBG monitoring, ED     Status: None   Collection Time: 04/30/24  9:42 PM  Result Value Ref Range   Glucose-Capillary 85 70 - 99 mg/dL    Comment: Glucose reference range applies only to samples taken after fasting for at least 8 hours.  Protime-INR     Status: None   Collection Time: 05/01/24  3:40 AM  Result Value Ref Range   Prothrombin Time 15.2 11.4 - 15.2 seconds   INR 1.1 0.8 - 1.2    Comment: (NOTE) INR goal varies based on device and disease states. Performed at [facility], [address], [place], [place] [address]   Comprehensive metabolic panel     Status: Abnormal   Collection Time: 05/01/24  3:40 AM  Result Value Ref Range   Sodium 139 135 - 145 mmol/L   Potassium 3.6 3.5 - 5.1 mmol/L   Chloride 108 98 - 111 mmol/L   CO2 21 (L) 22 - 32 mmol/L   Glucose, Bld 79 70 - 99 mg/dL    Comment: Glucose reference range applies only to samples taken after fasting  for at least 8 hours.   BUN 13 6 - 20 mg/dL   Creatinine, Ser 9.22 0.61 - 1.24 mg/dL   Calcium 9.1 8.9 - 89.6 mg/dL   Total Protein 6.5 6.5 - 8.1 g/dL   Albumin 3.7 3.5 - 5.0 g/dL   AST 829 (H) 15 - 41 U/L   ALT 474 (H) 0 - 44 U/L   Alkaline Phosphatase 187 (H) 38 - 126 U/L   Total Bilirubin 2.5 (H) 0.0 - 1.2 mg/dL   GFR, Estimated >39 >39 mL/min    Comment: (NOTE) Calculated using the CKD-EPI Creatinine Equation (2021)    Anion gap 11 5 - 15    Comment: Performed at [facility], [address], [place], [place] [address]  CBC     Status: Abnormal   Collection Time: 05/01/24  3:40 AM  Result Value Ref Range   WBC 23.6 (H) 4.0 - 10.5 K/uL   RBC 4.01 (L) 4.22 - 5.81 MIL/uL   Hemoglobin 13.4 13.0 - 17.0 g/dL   HCT 60.1 60.9 - 47.9 %   MCV 99.3 80.0 - 100.0 fL   MCH 33.4 26.0 - 34.0 pg   MCHC 33.7 30.0 - 36.0 g/dL   RDW 87.3 88.4 - 84.4 %   Platelets 188 150 - 400 K/uL   nRBC 0.0 0.0 - 0.2 %    Comment: Performed at [facility], [address], [place], [place] [address]  HIV Antibody (routine testing w rflx)     Status: None   Collection Time: 05/01/24  5:04 PM  Result Value Ref Range   HIV Screen 4th Generation wRfx  Non Reactive Non Reactive    Comment: Performed at [facility], [address] [place], [place] [address]  Glucose, capillary     Status: Abnormal   Collection Time: 05/01/24 11:42 PM  Result Value Ref Range   Glucose-Capillary 134 (H) 70 - 99 mg/dL    Comment: Glucose reference range applies only to samples taken after fasting for at least 8 hours.  CBC     Status: Abnormal   Collection Time: 05/02/24  5:06 AM  Result Value Ref Range   WBC 12.5 (H) 4.0 - 10.5 K/uL   RBC 4.04 (L) 4.22 - 5.81 MIL/uL   Hemoglobin 13.2 13.0 - 17.0 g/dL   HCT 61.1 (L) 60.9 - 47.9 %   MCV 96.0 80.0 - 100.0 fL   MCH 32.7 26.0 - 34.0 pg   MCHC 34.0 30.0 - 36.0 g/dL   RDW 87.4 88.4 - 84.4 %   Platelets 214 150 - 400 K/uL   nRBC 0.0 0.0 - 0.2 %    Comment:  Performed at [facility], [address], [place], [place] [address]  Comprehensive metabolic panel with GFR     Status: Abnormal   Collection Time: 05/02/24  5:06 AM  Result Value Ref Range   Sodium 142 135 - 145 mmol/L    Comment: Electrolytes repeated to verify    Potassium 3.4 (L) 3.5 - 5.1 mmol/L   Chloride 109 98 - 111 mmol/L   CO2 20 (L) 22 - 32 mmol/L   Glucose, Bld 107 (H) 70 - 99 mg/dL    Comment: Glucose reference range applies only to samples taken after fasting for at least 8 hours.   BUN 13 6 - 20 mg/dL   Creatinine, Ser 9.21 0.61 - 1.24 mg/dL   Calcium 9.1 8.9 - 89.6 mg/dL   Total Protein 6.3 (L) 6.5 - 8.1 g/dL   Albumin 3.7 3.5 - 5.0 g/dL   AST 61 (H) 15 - 41 U/L   ALT 312 (H) 0 - 44 U/L   Alkaline Phosphatase 154 (H) 38 - 126 U/L   Total Bilirubin 1.5 (H) 0.0 - 1.2 mg/dL   GFR, Estimated >39 >39 mL/min    Comment: (NOTE) Calculated using the CKD-EPI Creatinine Equation (2021)    Anion gap 13 5 - 15    Comment: Performed at [facility], [address], [place], [place] [address]  Magnesium     Status: None   Collection Time: 05/02/24  5:06 AM  Result Value Ref Range   Magnesium 2.0 1.7 - 2.4 mg/dL    Comment: Performed at [facility], [address] [place], [place] [address]  Glucose, capillary     Status: Abnormal   Collection Time: 05/02/24  7:51 AM  Result Value Ref Range   Glucose-Capillary 105 (H) 70 - 99 mg/dL    Comment: Glucose reference range applies only to samples taken after fasting for at least 8 hours.    MR ABDOMEN MRCP W WO CONTRAST Result Date: 05/01/2024 EXAM: MRCP WITH AND WITHOUT IV CONTRAST 05/01/2024 02:47:00 PM TECHNIQUE: Multisequence, multiplanar magnetic resonance images of the abdomen with and without intravenous contrast. MRCP sequences were performed. Contrast: 10 cc gadovist COMPARISON: US  abdomen limited 04/30/2024 and CT abdomen and pelvis 04/30/2024. CLINICAL HISTORY: Epigastric pain; 885157 Epigastric  pain 114842; Cholelithiasis probable cholecystitis, elevated LFTs. FINDINGS: LIVER: Unremarkable. GALLBLADDER AND BILIARY SYSTEM: Numerous tiny stones are identified within the gallbladder measuring up to 4 mm.  There is equivocal wall thickening with diffuse gallbladder wall enhancement. Gallbladder wall thickness measures upper limits of normal at 3 mm. No pericholecystic fluid or soft tissue stranding. No intrahepatic bile duct dilatation. The common bile duct is normal in caliber measuring 5 mm, image 19/5. MRCP images are motion limited without signs of choledocholithiasis. SPLEEN: The spleen is within normal limits in size and appearance. PANCREAS/PANCREATIC DUCT: The pancreas is normal in size and contour without a focal lesion or ductal dilatation. ADRENAL GLANDS: Normal size and morphology bilaterally. No nodule, thickening, or hemorrhage. No periadrenal stranding. KIDNEYS: Unremarkable. LYMPH NODES: No enlarged abdominal lymph nodes. VASCULATURE: Unremarkable. PERITONEUM: No ascites. ABDOMINAL WALL: No hernia. No mass. BOWEL: Grossly unremarkable. No bowel obstruction. BONES: No acute abnormality or worrisome osseous lesion. SOFT TISSUES: Unremarkable. MISCELLANEOUS: Unremarkable. IMPRESSION: 1. Numerous tiny gallstones with equivocal imaging findings for acute cholecystitis, with differential considerations including early acute cholecystitis versus chronic cholecystitis. 2. No intrahepatic or extrahepatic bile duct dilatation. 3. Motion-limited MRCP without choledocholithiasis identified; if ongoing concern for biliary obstruction, consider repeat MRCP when able or endoscopic ultrasound or ERCP. Electronically signed by: Waddell Calk MD 05/01/2024 03:37 PM EST RP Workstation: [id number]   MR 3D Recon At Scanner Result Date: 05/01/2024 EXAM: MRCP WITH AND WITHOUT IV CONTRAST 05/01/2024 02:47:00 PM TECHNIQUE: Multisequence, multiplanar magnetic resonance images of the abdomen with and without  intravenous contrast. MRCP sequences were performed. Contrast: 10 cc gadovist COMPARISON: US  abdomen limited 04/30/2024 and CT abdomen and pelvis 04/30/2024. CLINICAL HISTORY: Epigastric pain; 885157 Epigastric pain 114842; Cholelithiasis probable cholecystitis, elevated LFTs. FINDINGS: LIVER: Unremarkable. GALLBLADDER AND BILIARY SYSTEM: Numerous tiny stones are identified within the gallbladder measuring up to 4 mm. There is equivocal wall thickening with diffuse gallbladder wall enhancement. Gallbladder wall thickness measures upper limits of normal at 3 mm. No pericholecystic fluid or soft tissue stranding. No intrahepatic bile duct dilatation. The common bile duct is normal in caliber measuring 5 mm, image 19/5. MRCP images are motion limited without signs of choledocholithiasis. SPLEEN: The spleen is within normal limits in size and appearance. PANCREAS/PANCREATIC DUCT: The pancreas is normal in size and contour without a focal lesion or ductal dilatation. ADRENAL GLANDS: Normal size and morphology bilaterally. No nodule, thickening, or hemorrhage. No periadrenal stranding. KIDNEYS: Unremarkable. LYMPH NODES: No enlarged abdominal lymph nodes. VASCULATURE: Unremarkable. PERITONEUM: No ascites. ABDOMINAL WALL: No hernia. No mass. BOWEL: Grossly unremarkable. No bowel obstruction. BONES: No acute abnormality or worrisome osseous lesion. SOFT TISSUES: Unremarkable. MISCELLANEOUS: Unremarkable. IMPRESSION: 1. Numerous tiny gallstones with equivocal imaging findings for acute cholecystitis, with differential considerations including early acute cholecystitis versus chronic cholecystitis. 2. No intrahepatic or extrahepatic bile duct dilatation. 3. Motion-limited MRCP without choledocholithiasis identified; if ongoing concern for biliary obstruction, consider repeat MRCP when able or endoscopic ultrasound or ERCP. Electronically signed by: Waddell Calk MD 05/01/2024 03:37 PM EST RP Workstation: [id number]   US   Abdomen Limited RUQ (LIVER/GB) Result Date: 04/30/2024 CLINICAL DATA:  Elevated liver function tests EXAM: ULTRASOUND ABDOMEN LIMITED RIGHT UPPER QUADRANT COMPARISON:  CT scan of same day FINDINGS: Gallbladder: WES sign is noted suggesting cholelithiasis, presumably non calcified as they do not appear on CT scan. Mild gallbladder wall thickening is noted at 4 mm. No sonographic Murphy's sign is noted. Common bile duct: Diameter: 5 mm which is within normal limits. Liver: No focal lesion identified. Within normal limits in parenchymal echogenicity. Portal vein is patent on color Doppler imaging with normal direction of blood flow towards the liver. Other: None. IMPRESSION: Findings  consistent with cholelithiasis with mild gallbladder wall thickening at 4 mm. Presumably the stones are noncalcified as they are not clearly visualized on CT scan of same day. If there is clinical concern for cholecystitis, HIDA scan is recommended for further evaluation. Electronically Signed   By: Lynwood Landy Raddle M.D.   On: 04/30/2024 15:30   CT ABDOMEN PELVIS W CONTRAST Result Date: 04/30/2024 CLINICAL DATA:  Mild right upper quadrant abdominal pain. Abnormal liver enzymes. EXAM: CT ABDOMEN AND PELVIS WITH CONTRAST TECHNIQUE: Multidetector CT imaging of the abdomen and pelvis was performed using the standard protocol following bolus administration of intravenous contrast. RADIATION DOSE REDUCTION: This exam was performed according to the departmental dose-optimization program which includes automated exposure control, adjustment of the mA and/or kV according to patient size and/or use of iterative reconstruction technique. CONTRAST:  75mL OMNIPAQUE  IOHEXOL  350 MG/ML SOLN COMPARISON:  None Available. FINDINGS: Lower chest: No acute abnormality. Hepatobiliary: No focal liver abnormality is seen. No gallstones, gallbladder wall thickening, or biliary dilatation. Pancreas: Unremarkable. No pancreatic ductal dilatation or surrounding  inflammatory changes. Spleen: Normal in size without focal abnormality. Adrenals/Urinary Tract: Adrenal glands are unremarkable. Kidneys are normal, without renal calculi, focal lesion, or hydronephrosis. Bladder is unremarkable. Stomach/Bowel: Stomach is within normal limits. Appendix appears normal. No evidence of bowel wall thickening, distention, or inflammatory changes. Vascular/Lymphatic: Aortic atherosclerosis. No enlarged abdominal or pelvic lymph nodes. Reproductive: Prostate is unremarkable. Other: No abdominal wall hernia or abnormality. No abdominopelvic ascites. Musculoskeletal: No acute or significant osseous findings. IMPRESSION: 1. No acute abnormality seen in the abdomen or pelvis. 2. Aortic atherosclerosis. Aortic Atherosclerosis (ICD10-I70.0). Electronically Signed   By: Lynwood Landy Raddle M.D.   On: 04/30/2024 15:20    A/P: Bobby Stafford is an 49 y.o. male with multiple sclerosis here with suspected transient choledocholithiasis  - LFTs downtrending.  MRCP did not demonstrate any clear stones in the common bile duct.  Does have imaging findings that may suggest at least cholecystitis as well.  This would go along with his leukocytosis although he is also on steroids which can result in similar.  - The anatomy and physiology of the hepatobiliary system was discussed with him this morning. The pathophysiology of gallbladder disease was then reviewed as well. - The options for treatment were discussed including ongoing observation which may result in subsequent gallbladder complications (infection, pancreatitis, recurrent choledocholithiasis, etc), drainage procedures, and surgery - laparoscopic cholecystectomy with possible intraoperative cholangiogram, indocyanine green cholangiography. -  The planned procedure, material risks (including, but not limited to, pain, bleeding, infection, scarring, need for blood transfusion, damage to surrounding structures- blood vessels/nerves/viscus/organs,  damage to bile duct, bile leak, chronic diarrhea, conversion to a 'subtotal' cholecystectomy and general expectations therein, post-cholecystectomy diarrhea, potential need for additional procedures including EGD/ERCP, blood clot, pulmonary embolus, hernia, worsening of pre-existing medical conditions, pancreatitis, pneumonia, heart attack, stroke, death) benefits and alternatives to surgery were discussed at length. We have noted a good probability that the procedure would help improve their symptoms. The patient's questions were answered to his satisfaction, he voiced understanding and elected to proceed with surgery. Additionally, we discussed typical postoperative expectations and the recovery process.   -He would like us  to speak with his mother, Bobby Stafford, after surgery  I spent a total of 80 minutes in both face-to-face and non-face-to-face activities, excluding procedures performed, for this visit on the date of this encounter.   Lonni Pizza, MD [facility]    [1]  Allergies Allergen  Reactions   Topiramate Other (See Comments)    Seizures  Causes seizures   "

## 2024-05-02 NOTE — Plan of Care (Signed)
   Problem: Nutrition: Goal: Adequate nutrition will be maintained Outcome: Progressing   Problem: Coping: Goal: Level of anxiety will decrease Outcome: Progressing

## 2024-05-02 NOTE — Anesthesia Procedure Notes (Signed)
 Procedure Name: Intubation Date/Time: 05/02/2024 11:19 AM  Performed by: Evette Ade, CRNAPre-anesthesia Checklist: Patient identified, Emergency Drugs available, Suction available, Patient being monitored and Timeout performed Patient Re-evaluated:Patient Re-evaluated prior to induction Oxygen Delivery Method: Circle system utilized Preoxygenation: Pre-oxygenation with 100% oxygen Induction Type: IV induction Ventilation: Mask ventilation without difficulty and Oral airway inserted - appropriate to patient size Laryngoscope Size: Mac and 4 Grade View: Grade II Tube type: Oral Tube size: 8.0 mm Number of attempts: 1 Airway Equipment and Method: Stylet Tube secured with: Tape Dental Injury: Teeth and Oropharynx as per pre-operative assessment

## 2024-05-02 NOTE — Progress Notes (Addendum)
 Call made to [facility] for Nursing and Rehab [phone]. Request patient home med dalfampridine be brought to [facility]. Pharmacy unable to order this medication.   Medication received (#26 tablets) at 1800 from [facility] with [facility] for Nursing and Rehab. Home medication form filled out and both given to [facility] pharmacist for verification. Education given to patient to ensure remainder of medication needs to go with him/his belongings back to facility on day of discharge. Paperwork in chart and information added in Epic under patient belongings.

## 2024-05-02 NOTE — Anesthesia Postprocedure Evaluation (Signed)
"   Anesthesia Post Note  Patient: Bobby Stafford  Procedure(s) Performed: LAPAROSCOPIC CHOLECYSTECTOMY (Abdomen) INDOCYANINE GREEN FLUORESCENCE IMAGING (ICG) (Abdomen)     Patient location during evaluation: PACU Anesthesia Type: General Level of consciousness: awake and alert Pain management: pain level controlled Vital Signs Assessment: post-procedure vital signs reviewed and stable Respiratory status: spontaneous breathing, nonlabored ventilation, respiratory function stable and patient connected to nasal cannula oxygen Cardiovascular status: blood pressure returned to baseline and stable Postop Assessment: no apparent nausea or vomiting Anesthetic complications: no   No notable events documented.  Last Vitals:  Vitals:   05/02/24 1310 05/02/24 1315  BP:  (!) 140/76  Pulse: 73 70  Resp: 15 12  Temp:  36.8 C  SpO2: 94% 94%    Last Pain:  Vitals:   05/02/24 1300  TempSrc:   PainSc: 0-No pain                 Tristan Proto S      "

## 2024-05-02 NOTE — Progress Notes (Signed)
 GI service is aware of the MRCP without apparent choledocholithiasis (though with some motion artifact).  Gallstone seen  LFTs improving (suggesting stone likely passed)  Spoke with hospitalist physician earlier today and surgical consult was obtained.  Patient has gone to the OR for lap chole with IOC.  Contact our service if IOC identifies CBD stone that requires ERCP.  Thank you   - Victory Brand, MD    [facility]

## 2024-05-02 NOTE — Progress Notes (Signed)
 OT Cancellation Note and Discharge  Patient Details Name: Bobby Stafford MRN: [id number] DOB: 10/28/1975   Cancelled Treatment:    Reason Eval/Treat Not Completed: OT screened, no needs identified, will sign off. Pt is a resident of SNF and total care at baseline per evaluating PT in our acute care rehab sticky note letting OT know to screen patient.   Donny BECKER OT Acute Rehabilitation Services Office [phone]   [name] 05/02/2024, 3:57 PM

## 2024-05-02 NOTE — Progress Notes (Signed)
 " PROGRESS NOTE  Bon Dowis FMW:[id number] DOB: [birth year] DOA: 04/30/2024 PCP: Patient, No Pcp Per   LOS: 2 days   Brief Narrative / Interim history: 49 year old male with history of MS, paraparesis, lives in a nursing home comes into the hospital with right upper quadrant abdominal pain.  He was found to have significant elevation of LFTs, GI was consulted and he was admitted to the hospital  Subjective / 24h Interval events: Feeling well today, no abdominal pain, no nausea or vomiting  Assesement and Plan: Principal problem Elevated LFTs-on admission was found to have AST and ALT into the 3-600 range, white count of 32K and a total bilirubin of 6.3.  CT scan of the abdomen and pelvis was without acute abnormalities, but an ultrasound revealed cholelithiasis with mild gallbladder wall thickening at 4 mm.  MRI/MRCP without choledocholithiasis - LFTs improving.  It is possible that he may have passed a stone.  General surgery consulted, he will be taken to the OR today for cholecystectomy  Active problems MS, paraparesis -wheelchair-bound, has been in the nursing home for the past 2 years.  He has right upper extremity bilateral lower extremity weakness.  Continue home medications  Leukocytosis-due to #1, continue Zosyn .  WBC downtrending appropriately  Scheduled Meds:  [MAR Hold] acidophilus  1 capsule Oral q AM   [MAR Hold] atorvastatin  10 mg Oral QHS   [MAR Hold] baclofen   20 mg Oral TID   [MAR Hold] dalfampridine  10 mg Oral Q12H   [MAR Hold] DULoxetine  60 mg Oral Daily   [MAR Hold] lactulose  20 g Oral QHS   [MAR Hold] lidocaine  1 patch Transdermal Q24H   [MAR Hold] multivitamin with minerals  1 tablet Oral q AM   [MAR Hold] predniSONE   10 mg Oral Daily   [MAR Hold] pregabalin  75 mg Oral TID   [MAR Hold] senna-docusate  2 tablet Oral QHS   Continuous Infusions:  [MAR Hold] piperacillin -tazobactam 12.5 mL/hr at 05/02/24 0900   PRN Meds:.0.9 % irrigation (POUR BTL),  bupivacaine-EPINEPHrine, [MAR Hold] ibuprofen, [MAR Hold] ondansetron **OR** [MAR Hold] ondansetron (ZOFRAN) IV, [MAR Hold] polyethylene glycol, sodium chloride  irrigation, [MAR Hold] traMADol   Current Outpatient Medications  Medication Instructions   aspirin  81 mg, Oral, Daily   atorvastatin (LIPITOR) 10 mg, Oral, Daily at bedtime   baclofen  (LIORESAL ) 20 mg, Oral, 3 times daily   celecoxib (CELEBREX) 400 mg, Oral, Every morning   dalfampridine (AMPYRA) 10 mg, Every 12 hours   DULoxetine (CYMBALTA) 60 mg, Every morning   lactulose (CHRONULAC) 20 g, Oral, Daily at bedtime   lidocaine ([employer] LIDOCAINE PAIN RELIEF) 4 % 1 patch, Transdermal, Every 24 hours   Magnesium Glycinate 300 mg, Oral, Every morning   Menthol, Topical Analgesic, (BIOFREEZE COOL THE PAIN) 4 % GEL 1 Application, Topical, 2 times daily, Apply a thin layer topically twice a day at 1100 and 2100.  Apply to left shoulder for pain   Menthol, Topical Analgesic, (BIOFREEZE COOL THE PAIN) 4 % GEL 1 Application, Topical, Every 8 hours PRN   Multiple Vitamin (MULTIVITAMIN WITH MINERALS) TABS tablet 1 tablet, Oral, Every morning   omega-3 acid ethyl esters (LOVAZA) 1 g, Oral, Daily at bedtime   predniSONE  (DELTASONE ) 10 mg, Oral, Every morning   pregabalin (LYRICA) 75 mg, Oral, 3 times daily   saccharomyces boulardii (FLORASTOR) 250 mg, Every morning   senna-docusate (SENOKOT-S) 8.6-50 MG tablet 2 tablets, Oral, Daily at bedtime   topiramate (TOPAMAX) 100 mg, Oral,  2 times daily   traMADol  (ULTRAM ) 50 mg, Oral, Every 8 hours PRN   Vitamin D  (Ergocalciferol ) (DRISDOL) 50,000 Units, Every Mon   zinc  gluconate 50 mg, Oral, Every morning    Diet Orders (From admission, onward)     Start     Ordered   05/02/24 0644  Diet NPO time specified  Diet effective now        05/02/24 0643            DVT prophylaxis: SCDs Start: 04/30/24 2107   Lab Results  Component Value Date   PLT 214 05/02/2024      Code Status: Full  Code  Family Communication: No family at bedside  Status is: Inpatient Remains inpatient appropriate because: Severity of illness   Level of care: Med-Surg  Consultants:  Gastroenterology  Objective: Vitals:   05/01/24 2338 05/02/24 0647 05/02/24 0905 05/02/24 0935  BP: 117/71 124/83 133/83 130/78  Pulse: 74 72 66 72  Resp: 14 15 16 18   Temp: 98.4 F (36.9 C) 97.8 F (36.6 C) 98.1 F (36.7 C) 98.2 F (36.8 C)  TempSrc: Oral Oral Oral Oral  SpO2: 93% 97% 92% 99%  Weight:      Height:        Intake/Output Summary (Last 24 hours) at 05/02/2024 1200 Last data filed at 05/02/2024 0900 Gross per 24 hour  Intake 230.79 ml  Output 350 ml  Net -119.21 ml   Wt Readings from Last 3 Encounters:  04/30/24 130.2 kg  03/09/15 (!) 167.8 kg  04/10/14 (!) 151.5 kg    Examination:  Constitutional: NAD Eyes: lids and conjunctivae normal, no scleral icterus ENMT: mmm Neck: normal, supple Respiratory: clear to auscultation bilaterally, no wheezing, no crackles. Normal respiratory effort.  Cardiovascular: Regular rate and rhythm, no murmurs / rubs / gallops. No LE edema. Abdomen: soft, no distention, no tenderness. Bowel sounds positive.    Data Reviewed: I have independently reviewed following labs and imaging studies   CBC Recent Labs  Lab 04/30/24 1250 05/01/24 0340 05/02/24 0506  WBC 32.5* 23.6* 12.5*  HGB 14.3 13.4 13.2  HCT 43.0 39.8 38.8*  PLT 247 188 214  MCV 98.6 99.3 96.0  MCH 32.8 33.4 32.7  MCHC 33.3 33.7 34.0  RDW 12.3 12.6 12.5  LYMPHSABS 1.4  --   --   MONOABS 2.2*  --   --   EOSABS 0.0  --   --   BASOSABS 0.1  --   --     Recent Labs  Lab 04/30/24 1250 05/01/24 0340 05/02/24 0506  NA 140 139 142  K 4.0 3.6 3.4*  CL 108 108 109  CO2 22 21* 20*  GLUCOSE 88 79 107*  BUN 16 13 13   CREATININE 0.89 0.77 0.78  CALCIUM 9.3 9.1 9.1  AST 336* 170* 61*  ALT 654* 474* 312*  ALKPHOS 220* 187* 154*  BILITOT 6.3* 2.5* 1.5*  ALBUMIN 4.1 3.7 3.7  MG   --   --  2.0  INR  --  1.1  --     ------------------------------------------------------------------------------------------------------------------ No results for input(s): CHOL, HDL, LDLCALC, TRIG, CHOLHDL, LDLDIRECT in the last 72 hours.  Lab Results  Component Value Date   HGBA1C 4.9 10/10/2013   ------------------------------------------------------------------------------------------------------------------ No results for input(s): TSH, T4TOTAL, T3FREE, THYROIDAB in the last 72 hours.  Invalid input(s): FREET3  Cardiac Enzymes No results for input(s): CKMB, TROPONINI, MYOGLOBIN in the last 168 hours.  Invalid input(s): CK ------------------------------------------------------------------------------------------------------------------ No results found for:  BNP  CBG: Recent Labs  Lab 04/30/24 2142 05/01/24 2342 05/02/24 0751  GLUCAP 85 134* 105*    No results found for this or any previous visit (from the past 240 hours).   Radiology Studies: MR ABDOMEN MRCP W WO CONTRAST Result Date: 05/01/2024 EXAM: MRCP WITH AND WITHOUT IV CONTRAST 05/01/2024 02:47:00 PM TECHNIQUE: Multisequence, multiplanar magnetic resonance images of the abdomen with and without intravenous contrast. MRCP sequences were performed. Contrast: 10 cc gadovist COMPARISON: US  abdomen limited 04/30/2024 and CT abdomen and pelvis 04/30/2024. CLINICAL HISTORY: Epigastric pain; 885157 Epigastric pain 114842; Cholelithiasis probable cholecystitis, elevated LFTs. FINDINGS: LIVER: Unremarkable. GALLBLADDER AND BILIARY SYSTEM: Numerous tiny stones are identified within the gallbladder measuring up to 4 mm. There is equivocal wall thickening with diffuse gallbladder wall enhancement. Gallbladder wall thickness measures upper limits of normal at 3 mm. No pericholecystic fluid or soft tissue stranding. No intrahepatic bile duct dilatation. The common bile duct is normal in caliber  measuring 5 mm, image 19/5. MRCP images are motion limited without signs of choledocholithiasis. SPLEEN: The spleen is within normal limits in size and appearance. PANCREAS/PANCREATIC DUCT: The pancreas is normal in size and contour without a focal lesion or ductal dilatation. ADRENAL GLANDS: Normal size and morphology bilaterally. No nodule, thickening, or hemorrhage. No periadrenal stranding. KIDNEYS: Unremarkable. LYMPH NODES: No enlarged abdominal lymph nodes. VASCULATURE: Unremarkable. PERITONEUM: No ascites. ABDOMINAL WALL: No hernia. No mass. BOWEL: Grossly unremarkable. No bowel obstruction. BONES: No acute abnormality or worrisome osseous lesion. SOFT TISSUES: Unremarkable. MISCELLANEOUS: Unremarkable. IMPRESSION: 1. Numerous tiny gallstones with equivocal imaging findings for acute cholecystitis, with differential considerations including early acute cholecystitis versus chronic cholecystitis. 2. No intrahepatic or extrahepatic bile duct dilatation. 3. Motion-limited MRCP without choledocholithiasis identified; if ongoing concern for biliary obstruction, consider repeat MRCP when able or endoscopic ultrasound or ERCP. Electronically signed by: Waddell Calk MD 05/01/2024 03:37 PM EST RP Workstation: [id number]   MR 3D Recon At Scanner Result Date: 05/01/2024 EXAM: MRCP WITH AND WITHOUT IV CONTRAST 05/01/2024 02:47:00 PM TECHNIQUE: Multisequence, multiplanar magnetic resonance images of the abdomen with and without intravenous contrast. MRCP sequences were performed. Contrast: 10 cc gadovist COMPARISON: US  abdomen limited 04/30/2024 and CT abdomen and pelvis 04/30/2024. CLINICAL HISTORY: Epigastric pain; 885157 Epigastric pain 114842; Cholelithiasis probable cholecystitis, elevated LFTs. FINDINGS: LIVER: Unremarkable. GALLBLADDER AND BILIARY SYSTEM: Numerous tiny stones are identified within the gallbladder measuring up to 4 mm. There is equivocal wall thickening with diffuse gallbladder wall  enhancement. Gallbladder wall thickness measures upper limits of normal at 3 mm. No pericholecystic fluid or soft tissue stranding. No intrahepatic bile duct dilatation. The common bile duct is normal in caliber measuring 5 mm, image 19/5. MRCP images are motion limited without signs of choledocholithiasis. SPLEEN: The spleen is within normal limits in size and appearance. PANCREAS/PANCREATIC DUCT: The pancreas is normal in size and contour without a focal lesion or ductal dilatation. ADRENAL GLANDS: Normal size and morphology bilaterally. No nodule, thickening, or hemorrhage. No periadrenal stranding. KIDNEYS: Unremarkable. LYMPH NODES: No enlarged abdominal lymph nodes. VASCULATURE: Unremarkable. PERITONEUM: No ascites. ABDOMINAL WALL: No hernia. No mass. BOWEL: Grossly unremarkable. No bowel obstruction. BONES: No acute abnormality or worrisome osseous lesion. SOFT TISSUES: Unremarkable. MISCELLANEOUS: Unremarkable. IMPRESSION: 1. Numerous tiny gallstones with equivocal imaging findings for acute cholecystitis, with differential considerations including early acute cholecystitis versus chronic cholecystitis. 2. No intrahepatic or extrahepatic bile duct dilatation. 3. Motion-limited MRCP without choledocholithiasis identified; if ongoing concern for biliary obstruction, consider repeat MRCP when able or endoscopic  ultrasound or ERCP. Electronically signed by: Waddell Calk MD 05/01/2024 03:37 PM EST RP Workstation: [id number]     Nilda Fendt, MD, PhD [facility]  Between 7 am - 7 pm I am available, please contact me via Amion (for emergencies) or Securechat (non urgent messages)  Between 7 pm - 7 am I am not available, please contact night coverage MD/APP via Amion  "

## 2024-05-03 ENCOUNTER — Encounter (HOSPITAL_COMMUNITY): Payer: Self-pay | Admitting: Surgery

## 2024-05-03 DIAGNOSIS — R748 Abnormal levels of other serum enzymes: Secondary | ICD-10-CM | POA: Diagnosis not present

## 2024-05-03 LAB — COMPREHENSIVE METABOLIC PANEL WITH GFR
ALT: 228 U/L — ABNORMAL HIGH (ref 0–44)
AST: 48 U/L — ABNORMAL HIGH (ref 15–41)
Albumin: 3.6 g/dL (ref 3.5–5.0)
Alkaline Phosphatase: 144 U/L — ABNORMAL HIGH (ref 38–126)
Anion gap: 11 (ref 5–15)
BUN: 7 mg/dL (ref 6–20)
CO2: 22 mmol/L (ref 22–32)
Calcium: 9 mg/dL (ref 8.9–10.3)
Chloride: 108 mmol/L (ref 98–111)
Creatinine, Ser: 0.75 mg/dL (ref 0.61–1.24)
GFR, Estimated: >60 mL/min
Glucose, Bld: 89 mg/dL (ref 70–99)
Potassium: 3.5 mmol/L (ref 3.5–5.1)
Sodium: 141 mmol/L (ref 135–145)
Total Bilirubin: 1.4 mg/dL — ABNORMAL HIGH (ref 0.0–1.2)
Total Protein: 6.6 g/dL (ref 6.5–8.1)

## 2024-05-03 LAB — CBC
HCT: 38.3 % — ABNORMAL LOW (ref 39.0–52.0)
Hemoglobin: 13.1 g/dL (ref 13.0–17.0)
MCH: 33 pg (ref 26.0–34.0)
MCHC: 34.2 g/dL (ref 30.0–36.0)
MCV: 96.5 fL (ref 80.0–100.0)
Platelets: 233 K/uL (ref 150–400)
RBC: 3.97 MIL/uL — ABNORMAL LOW (ref 4.22–5.81)
RDW: 12.2 % (ref 11.5–15.5)
WBC: 14.4 K/uL — ABNORMAL HIGH (ref 4.0–10.5)
nRBC: 0 % (ref 0.0–0.2)

## 2024-05-03 NOTE — Progress Notes (Signed)
 " PROGRESS NOTE  Cyrus Ramsburg FMW:[id number] DOB: June 16, 1975 DOA: 04/30/2024 PCP: Patient, No Pcp Per   LOS: 3 days   Brief Narrative / Interim history: 49 year old male with history of MS, paraparesis, lives in a nursing home comes into the hospital with right upper quadrant abdominal pain.  He was found to have significant elevation of LFTs, GI was consulted and he was admitted to the hospital  Subjective / 24h Interval events: He is complaining of abdominal soreness.  No nausea or vomiting.  Has been able to be 11  Assesement and Plan: Principal problem Elevated LFTs-on admission was found to have AST and ALT into the 3-600 range, white count of 32K and a total bilirubin of 6.3.  CT scan of the abdomen and pelvis was without acute abnormalities, but an ultrasound revealed cholelithiasis with mild gallbladder wall thickening at 4 mm.  MRI/MRCP without choledocholithiasis - Surgery consulted, he is now status post laparoscopic cholecystectomy 1/1.  He is on clear liquids this morning.  Discussed with surgical team, advance as tolerated, if he is doing well will be able to go home tomorrow assuming his LFTs are stable as well  Active problems MS, paraparesis -wheelchair-bound, has been in the nursing home for the past 2 years.  He has right upper extremity bilateral lower extremity weakness.  Continue home medications  Leukocytosis-due to #1, continue Zosyn .  White count overall better  Scheduled Meds:  acidophilus  1 capsule Oral q AM   atorvastatin  10 mg Oral QHS   baclofen   20 mg Oral TID   dalfampridine  10 mg Oral Q12H   DULoxetine  60 mg Oral Daily   lactulose  20 g Oral QHS   lidocaine  1 patch Transdermal Q24H   multivitamin with minerals  1 tablet Oral q AM   predniSONE   10 mg Oral Daily   pregabalin  75 mg Oral TID   senna-docusate  2 tablet Oral QHS   Continuous Infusions:  piperacillin -tazobactam 3.375 g (05/03/24 0702)   PRN Meds:.ibuprofen, ondansetron **OR**  ondansetron (ZOFRAN) IV, polyethylene glycol, traMADol   Current Outpatient Medications  Medication Instructions   aspirin  81 mg, Oral, Daily   atorvastatin (LIPITOR) 10 mg, Oral, Daily at bedtime   baclofen  (LIORESAL ) 20 mg, Oral, 3 times daily   celecoxib (CELEBREX) 400 mg, Oral, Every morning   dalfampridine (AMPYRA) 10 mg, Every 12 hours   DULoxetine (CYMBALTA) 60 mg, Every morning   lactulose (CHRONULAC) 20 g, Oral, Daily at bedtime   lidocaine ([employer] LIDOCAINE PAIN RELIEF) 4 % 1 patch, Transdermal, Every 24 hours   Magnesium Glycinate 300 mg, Oral, Every morning   Menthol, Topical Analgesic, (BIOFREEZE COOL THE PAIN) 4 % GEL 1 Application, Topical, 2 times daily, Apply a thin layer topically twice a day at 1100 and 2100.  Apply to left shoulder for pain   Menthol, Topical Analgesic, (BIOFREEZE COOL THE PAIN) 4 % GEL 1 Application, Topical, Every 8 hours PRN   Multiple Vitamin (MULTIVITAMIN WITH MINERALS) TABS tablet 1 tablet, Oral, Every morning   omega-3 acid ethyl esters (LOVAZA) 1 g, Oral, Daily at bedtime   predniSONE  (DELTASONE ) 10 mg, Oral, Every morning   pregabalin (LYRICA) 75 mg, Oral, 3 times daily   saccharomyces boulardii (FLORASTOR) 250 mg, Every morning   senna-docusate (SENOKOT-S) 8.6-50 MG tablet 2 tablets, Oral, Daily at bedtime   topiramate (TOPAMAX) 100 mg, Oral, 2 times daily   traMADol  (ULTRAM ) 50 mg, Oral, Every 8 hours PRN   Vitamin  D (Ergocalciferol ) (DRISDOL) 50,000 Units, Every Mon   zinc  gluconate 50 mg, Oral, Every morning    Diet Orders (From admission, onward)     Start     Ordered   05/03/24 1139  Diet regular Room service appropriate? Yes; Fluid consistency: Thin  Diet effective now       Question Answer Comment  Room service appropriate? Yes   Fluid consistency: Thin      05/03/24 1138            DVT prophylaxis: SCDs Start: 04/30/24 2107   Lab Results  Component Value Date   PLT 233 05/03/2024      Code Status: Full  Code  Family Communication: No family at bedside  Status is: Inpatient Remains inpatient appropriate because: Severity of illness   Level of care: Med-Surg  Consultants:  Gastroenterology  Objective: Vitals:   05/02/24 2207 05/03/24 0136 05/03/24 0422 05/03/24 1142  BP: 126/85 134/89 126/77 136/73  Pulse: 70 70 62 82  Resp:  13 17 16   Temp: 98.1 F (36.7 C) 98.1 F (36.7 C) 98.6 F (37 C) 98.4 F (36.9 C)  TempSrc: Oral   Oral  SpO2: 96% 93% 90% 94%  Weight:      Height:        Intake/Output Summary (Last 24 hours) at 05/03/2024 1316 Last data filed at 05/03/2024 9371 Gross per 24 hour  Intake 1043.37 ml  Output 1600 ml  Net -556.63 ml   Wt Readings from Last 3 Encounters:  04/30/24 130.2 kg  03/09/15 (!) 167.8 kg  04/10/14 (!) 151.5 kg    Examination:  Constitutional: NAD Eyes: lids and conjunctivae normal, no scleral icterus ENMT: mmm Neck: normal, supple Respiratory: clear to auscultation bilaterally, no wheezing, no crackles. Normal respiratory effort.  Cardiovascular: Regular rate and rhythm, no murmurs / rubs / gallops. No LE edema. Abdomen: soft, no distention, no tenderness. Bowel sounds positive.    Data Reviewed: I have independently reviewed following labs and imaging studies   CBC Recent Labs  Lab 04/30/24 1250 05/01/24 0340 05/02/24 0506 05/03/24 0610  WBC 32.5* 23.6* 12.5* 14.4*  HGB 14.3 13.4 13.2 13.1  HCT 43.0 39.8 38.8* 38.3*  PLT 247 188 214 233  MCV 98.6 99.3 96.0 96.5  MCH 32.8 33.4 32.7 33.0  MCHC 33.3 33.7 34.0 34.2  RDW 12.3 12.6 12.5 12.2  LYMPHSABS 1.4  --   --   --   MONOABS 2.2*  --   --   --   EOSABS 0.0  --   --   --   BASOSABS 0.1  --   --   --     Recent Labs  Lab 04/30/24 1250 05/01/24 0340 05/02/24 0506 05/03/24 0610  NA 140 139 142 141  K 4.0 3.6 3.4* 3.5  CL 108 108 109 108  CO2 22 21* 20* 22  GLUCOSE 88 79 107* 89  BUN 16 13 13 7   CREATININE 0.89 0.77 0.78 0.75  CALCIUM 9.3 9.1 9.1 9.0  AST  336* 170* 61* 48*  ALT 654* 474* 312* 228*  ALKPHOS 220* 187* 154* 144*  BILITOT 6.3* 2.5* 1.5* 1.4*  ALBUMIN 4.1 3.7 3.7 3.6  MG  --   --  2.0  --   INR  --  1.1  --   --     ------------------------------------------------------------------------------------------------------------------ No results for input(s): CHOL, HDL, LDLCALC, TRIG, CHOLHDL, LDLDIRECT in the last 72 hours.  Lab Results  Component Value Date  HGBA1C 4.9 10/10/2013   ------------------------------------------------------------------------------------------------------------------ No results for input(s): TSH, T4TOTAL, T3FREE, THYROIDAB in the last 72 hours.  Invalid input(s): FREET3  Cardiac Enzymes No results for input(s): CKMB, TROPONINI, MYOGLOBIN in the last 168 hours.  Invalid input(s): CK ------------------------------------------------------------------------------------------------------------------ No results found for: BNP  CBG: Recent Labs  Lab 04/30/24 2142 05/01/24 2342 05/02/24 0751 05/02/24 1617  GLUCAP 85 134* 105* 120*    No results found for this or any previous visit (from the past 240 hours).   Radiology Studies: No results found.    Nilda Fendt, MD, PhD [facility]  Between 7 am - 7 pm I am available, please contact me via Amion (for emergencies) or Securechat (non urgent messages)  Between 7 pm - 7 am I am not available, please contact night coverage MD/APP via Amion  "

## 2024-05-03 NOTE — Progress Notes (Signed)
 [facility] Progress Note  1 Day Post-Op  Subjective: CC:  Reports some abdominal bloating and similar pain compared to yesterday. Tolerating CLD. +flatus. Denies nausea or vomiting.  Objective: Vital signs in last 24 hours: Temp:  [98.1 F (36.7 C)-99.4 F (37.4 C)] 98.6 F (37 C) (01/02 0422) Pulse Rate:  [62-76] 62 (01/02 0422) Resp:  [12-18] 17 (01/02 0422) BP: (118-144)/(76-89) 126/77 (01/02 0422) SpO2:  [90 %-97 %] 90 % (01/02 0422) Last BM Date : 05/02/24 (small smear)  Intake/Output from previous day: 01/01 0701 - 01/02 0700 In: 1274.2 [P.O.:960; IV Piggyback:314.2] Out: 1600 [Urine:1600] Intake/Output this shift: No intake/output data recorded.  PE: Gen:  Alert, NAD, pleasant Card:  Regular rate and rhythm Pulm:  Normal effort ORA Abd: Soft, appropriately tender around incisions, mild upper abdominal fullness, no guarding. Incisions c/d/i Skin: warm and dry, no rashes  Psych: A&Ox3   Lab Results:  Recent Labs    05/02/24 0506 05/03/24 0610  WBC 12.5* 14.4*  HGB 13.2 13.1  HCT 38.8* 38.3*  PLT 214 233   BMET Recent Labs    05/02/24 0506 05/03/24 0610  NA 142 141  K 3.4* 3.5  CL 109 108  CO2 20* 22  GLUCOSE 107* 89  BUN 13 7  CREATININE 0.78 0.75  CALCIUM 9.1 9.0   PT/INR Recent Labs    05/01/24 0340  LABPROT 15.2  INR 1.1   CMP     Component Value Date/Time   NA 141 05/03/2024 0610   K 3.5 05/03/2024 0610   CL 108 05/03/2024 0610   CO2 22 05/03/2024 0610   GLUCOSE 89 05/03/2024 0610   BUN 7 05/03/2024 0610   CREATININE 0.75 05/03/2024 0610   CREATININE 0.95 03/09/2015 1042   CALCIUM 9.0 05/03/2024 0610   PROT 6.6 05/03/2024 0610   ALBUMIN 3.6 05/03/2024 0610   AST 48 (H) 05/03/2024 0610   ALT 228 (H) 05/03/2024 0610   ALKPHOS 144 (H) 05/03/2024 0610   BILITOT 1.4 (H) 05/03/2024 0610   GFRNONAA >60 05/03/2024 0610   GFRNONAA 86 09/11/2013 0826   GFRAA >60 08/14/2017 0344   GFRAA >89 09/11/2013 0826   Lipase      Component Value Date/Time   LIPASE 18 04/30/2024 1250       Studies/Results: MR ABDOMEN MRCP W WO CONTRAST Result Date: 05/01/2024 EXAM: MRCP WITH AND WITHOUT IV CONTRAST 05/01/2024 02:47:00 PM TECHNIQUE: Multisequence, multiplanar magnetic resonance images of the abdomen with and without intravenous contrast. MRCP sequences were performed. Contrast: 10 cc gadovist COMPARISON: US  abdomen limited 04/30/2024 and CT abdomen and pelvis 04/30/2024. CLINICAL HISTORY: Epigastric pain; 885157 Epigastric pain 114842; Cholelithiasis probable cholecystitis, elevated LFTs. FINDINGS: LIVER: Unremarkable. GALLBLADDER AND BILIARY SYSTEM: Numerous tiny stones are identified within the gallbladder measuring up to 4 mm. There is equivocal wall thickening with diffuse gallbladder wall enhancement. Gallbladder wall thickness measures upper limits of normal at 3 mm. No pericholecystic fluid or soft tissue stranding. No intrahepatic bile duct dilatation. The common bile duct is normal in caliber measuring 5 mm, image 19/5. MRCP images are motion limited without signs of choledocholithiasis. SPLEEN: The spleen is within normal limits in size and appearance. PANCREAS/PANCREATIC DUCT: The pancreas is normal in size and contour without a focal lesion or ductal dilatation. ADRENAL GLANDS: Normal size and morphology bilaterally. No nodule, thickening, or hemorrhage. No periadrenal stranding. KIDNEYS: Unremarkable. LYMPH NODES: No enlarged abdominal lymph nodes. VASCULATURE: Unremarkable. PERITONEUM: No ascites. ABDOMINAL WALL: No hernia. No mass. BOWEL: Grossly  unremarkable. No bowel obstruction. BONES: No acute abnormality or worrisome osseous lesion. SOFT TISSUES: Unremarkable. MISCELLANEOUS: Unremarkable. IMPRESSION: 1. Numerous tiny gallstones with equivocal imaging findings for acute cholecystitis, with differential considerations including early acute cholecystitis versus chronic cholecystitis. 2. No intrahepatic or  extrahepatic bile duct dilatation. 3. Motion-limited MRCP without choledocholithiasis identified; if ongoing concern for biliary obstruction, consider repeat MRCP when able or endoscopic ultrasound or ERCP. Electronically signed by: Waddell Calk MD 05/01/2024 03:37 PM EST RP Workstation: [id number]   MR 3D Recon At Scanner Result Date: 05/01/2024 EXAM: MRCP WITH AND WITHOUT IV CONTRAST 05/01/2024 02:47:00 PM TECHNIQUE: Multisequence, multiplanar magnetic resonance images of the abdomen with and without intravenous contrast. MRCP sequences were performed. Contrast: 10 cc gadovist COMPARISON: US  abdomen limited 04/30/2024 and CT abdomen and pelvis 04/30/2024. CLINICAL HISTORY: Epigastric pain; 885157 Epigastric pain 114842; Cholelithiasis probable cholecystitis, elevated LFTs. FINDINGS: LIVER: Unremarkable. GALLBLADDER AND BILIARY SYSTEM: Numerous tiny stones are identified within the gallbladder measuring up to 4 mm. There is equivocal wall thickening with diffuse gallbladder wall enhancement. Gallbladder wall thickness measures upper limits of normal at 3 mm. No pericholecystic fluid or soft tissue stranding. No intrahepatic bile duct dilatation. The common bile duct is normal in caliber measuring 5 mm, image 19/5. MRCP images are motion limited without signs of choledocholithiasis. SPLEEN: The spleen is within normal limits in size and appearance. PANCREAS/PANCREATIC DUCT: The pancreas is normal in size and contour without a focal lesion or ductal dilatation. ADRENAL GLANDS: Normal size and morphology bilaterally. No nodule, thickening, or hemorrhage. No periadrenal stranding. KIDNEYS: Unremarkable. LYMPH NODES: No enlarged abdominal lymph nodes. VASCULATURE: Unremarkable. PERITONEUM: No ascites. ABDOMINAL WALL: No hernia. No mass. BOWEL: Grossly unremarkable. No bowel obstruction. BONES: No acute abnormality or worrisome osseous lesion. SOFT TISSUES: Unremarkable. MISCELLANEOUS: Unremarkable. IMPRESSION: 1.  Numerous tiny gallstones with equivocal imaging findings for acute cholecystitis, with differential considerations including early acute cholecystitis versus chronic cholecystitis. 2. No intrahepatic or extrahepatic bile duct dilatation. 3. Motion-limited MRCP without choledocholithiasis identified; if ongoing concern for biliary obstruction, consider repeat MRCP when able or endoscopic ultrasound or ERCP. Electronically signed by: Waddell Calk MD 05/01/2024 03:37 PM EST RP Workstation: [id number]    Anti-infectives: Anti-infectives (From admission, onward)    Start     Dose/Rate Route Frequency Ordered Stop   04/30/24 1715  piperacillin -tazobactam (ZOSYN ) IVPB 3.375 g        3.375 g 12.5 mL/hr over 240 Minutes Intravenous Every 8 hours 04/30/24 1700           Latest Ref Rng & Units 05/03/2024    6:10 AM 05/02/2024    5:06 AM 05/01/2024    3:40 AM  Hepatic Function  Total Protein 6.5 - 8.1 g/dL 6.6  6.3  6.5   Albumin 3.5 - 5.0 g/dL 3.6  3.7  3.7   AST 15 - 41 U/L 48  61  170   ALT 0 - 44 U/L 228  312  474   Alk Phosphatase 38 - 126 U/L 144  154  187   Total Bilirubin 0.0 - 1.2 mg/dL 1.4  1.5  2.5      Assessment/Plan  Acute cholecystitis, possible recent choledocholithiasis  POD#1 s/p laparoscopic cholecystectomy with ICG Dr Teresa 1/1 AFVSS, WBC 14 from 12  LFTs stable as above  Tolerating clears, advance to regular Discussed with TRH, will repeat LFTs in AM and monitor abdominal pain/bloating    LOS: 3 days   I reviewed nursing notes, hospitalist notes, last 24 h  vitals and pain scores, last 48 h intake and output, last 24 h labs and trends, and last 24 h imaging results.  This care required moderate level of medical decision making.   Almarie Pringle, PA-C [facility] Please see Amion for pager number during day hours 7:00am-4:30pm

## 2024-05-03 NOTE — Plan of Care (Signed)
   Problem: Education: Goal: Knowledge of General Education information will improve Description: Including pain rating scale, medication(s)/side effects and non-pharmacologic comfort measures Outcome: Progressing   Problem: Activity: Goal: Risk for activity intolerance will decrease Outcome: Progressing   Problem: Nutrition: Goal: Adequate nutrition will be maintained Outcome: Progressing   Problem: Coping: Goal: Level of anxiety will decrease Outcome: Progressing   Problem: Elimination: Goal: Will not experience complications related to bowel motility Outcome: Progressing   Problem: Safety: Goal: Ability to remain free from injury will improve Outcome: Progressing

## 2024-05-04 DIAGNOSIS — R748 Abnormal levels of other serum enzymes: Secondary | ICD-10-CM | POA: Diagnosis not present

## 2024-05-04 LAB — COMPREHENSIVE METABOLIC PANEL WITH GFR
ALT: 165 U/L — ABNORMAL HIGH (ref 0–44)
AST: 35 U/L (ref 15–41)
Albumin: 3.7 g/dL (ref 3.5–5.0)
Alkaline Phosphatase: 120 U/L (ref 38–126)
Anion gap: 11 (ref 5–15)
BUN: 7 mg/dL (ref 6–20)
CO2: 23 mmol/L (ref 22–32)
Calcium: 9.1 mg/dL (ref 8.9–10.3)
Chloride: 106 mmol/L (ref 98–111)
Creatinine, Ser: 0.8 mg/dL (ref 0.61–1.24)
GFR, Estimated: >60 mL/min
Glucose, Bld: 92 mg/dL (ref 70–99)
Potassium: 3.4 mmol/L — ABNORMAL LOW (ref 3.5–5.1)
Sodium: 140 mmol/L (ref 135–145)
Total Bilirubin: 0.9 mg/dL (ref 0.0–1.2)
Total Protein: 6.5 g/dL (ref 6.5–8.1)

## 2024-05-04 LAB — CBC
HCT: 38.6 % — ABNORMAL LOW (ref 39.0–52.0)
Hemoglobin: 13.2 g/dL (ref 13.0–17.0)
MCH: 33.2 pg (ref 26.0–34.0)
MCHC: 34.2 g/dL (ref 30.0–36.0)
MCV: 97 fL (ref 80.0–100.0)
Platelets: 223 K/uL (ref 150–400)
RBC: 3.98 MIL/uL — ABNORMAL LOW (ref 4.22–5.81)
RDW: 12 % (ref 11.5–15.5)
WBC: 10.5 K/uL (ref 4.0–10.5)
nRBC: 0 % (ref 0.0–0.2)

## 2024-05-04 LAB — MAGNESIUM: Magnesium: 2.1 mg/dL (ref 1.7–2.4)

## 2024-05-04 MED ORDER — POTASSIUM CHLORIDE CRYS ER 20 MEQ PO TBCR
40.0000 meq | EXTENDED_RELEASE_TABLET | Freq: Once | ORAL | Status: AC
  Administered 2024-05-04: 40 meq via ORAL
  Filled 2024-05-04: qty 2

## 2024-05-04 MED ORDER — PREGABALIN 75 MG PO CAPS: 75.0000 mg | ORAL_CAPSULE | Freq: Three times a day (TID) | ORAL | 0 refills | Status: AC

## 2024-05-04 MED ORDER — TRAMADOL HCL 50 MG PO TABS: 50.0000 mg | ORAL_TABLET | Freq: Three times a day (TID) | ORAL | 1 refills | Status: AC | PRN

## 2024-05-04 NOTE — TOC Initial Note (Signed)
 Transition of Care [facility]) - Initial/Assessment Note    Patient Details  Name: Bobby Stafford MRN: [id number] Date of Birth: [birth year]  [facility]) CM/SW Contact:    Bridget Cordella Simmonds, LCSW Phone Number: 05/04/2024, 3:56 PM  Clinical Narrative:     CSW informed pt with DC summary, from [facility].  CSW spoke with [facility]. She confirmed pt is LTC resident there and can return today.  CSW spoke with pt, who confirms he is at [facility] and does agree with plan to return.              Expected Discharge Plan: Long Term Nursing Home Barriers to Discharge: No Barriers Identified   Patient Goals and CMS Choice     Choice offered to / list presented to : Patient (pt does plan to return to [facility])      Expected Discharge Plan and Services     Post Acute Care Choice: Skilled Nursing Facility Living arrangements for the past 2 months: Skilled Nursing Facility Expected Discharge Date: 05/04/24                                    Prior Living Arrangements/Services Living arrangements for the past 2 months: Skilled Nursing Facility Lives with:: Facility Resident Patient language and need for interpreter reviewed:: Yes        Need for Family Participation in Patient Care: No (Comment) Care giver support system in place?: Yes (comment)   Criminal Activity/Legal Involvement Pertinent to Current Situation/Hospitalization: No - Comment as needed  Activities of Daily Living   ADL Screening (condition at time of admission) Independently performs ADLs?: No Does the patient have a NEW difficulty with bathing/dressing/toileting/self-feeding that is expected to last >3 days?: Yes (Initiates electronic notice to provider for possible OT consult) Does the patient have a NEW difficulty with getting in/out of bed, walking, or climbing stairs that is expected to last >3 days?: Yes (Initiates electronic notice to provider for possible PT  consult) Does the patient have a NEW difficulty with communication that is expected to last >3 days?: No Is the patient deaf or have difficulty hearing?: No Does the patient have difficulty seeing, even when wearing glasses/contacts?: No Does the patient have difficulty concentrating, remembering, or making decisions?: No  Permission Sought/Granted                  Emotional Assessment Appearance:: Appears stated age Attitude/Demeanor/Rapport: Engaged Affect (typically observed): Appropriate, Pleasant Orientation: : Oriented to Self, Oriented to Place, Oriented to  Time, Oriented to Situation      Admission diagnosis:  Hyperbilirubinemia [E80.6] Generalized pain [R52] Transaminitis [R74.01] Elevated liver enzymes [R74.8] Right upper quadrant abdominal pain [R10.11] Patient Active Problem List   Diagnosis Date Noted   Right upper quadrant abdominal pain 05/01/2024   Calculus of gallbladder without cholecystitis without obstruction 05/01/2024   Hyperbilirubinemia 05/01/2024   Elevated liver enzymes 04/30/2024   Paraparesis (HCC) 04/30/2024   Morbid obesity (HCC) 03/09/2015   Impotence 04/10/2014   Generalized pain 04/10/2014   Multiple sclerosis 04/10/2014   MS (multiple sclerosis) 07/09/2013   Numbness on right side 07/04/2013   Nystagmus 07/04/2013   Weakness of right side of body 07/04/2013   PCP:  Patient, No Pcp Per Pharmacy:   [facility] - [place], [place] - [address] [place] [address] Phone: [phone] Fax: [phone]  [facility] - [place], [place] - [address] [place] [address] Phone: [phone] Fax: [phone]     Social Drivers of Health (SDOH) Social History: SDOH Screenings   Food Insecurity: No Food Insecurity (05/01/2024)  Housing: Low Risk (05/01/2024)  Transportation Needs: No Transportation Needs (05/01/2024)  Utilities: Not At Risk (05/01/2024)   Social Connections: Unknown (09/13/2021)   Received from [facility]  Tobacco Use: High Risk (05/02/2024)   SDOH Interventions:     Readmission Risk Interventions     No data to display

## 2024-05-04 NOTE — Plan of Care (Signed)

## 2024-05-04 NOTE — Progress Notes (Addendum)
 AVS completed for discharge packet and placed with chart.  Along with home meds. IV removed. Clean and Intact.Primary nurse notified.

## 2024-05-04 NOTE — Progress Notes (Signed)
 [facility] Progress Note  2 Days Post-Op  Subjective: CC:  AF and HDS. LFTs WNL.   On exam, he reports some RUQ tenderness. Tolerating PO. Denies other complaints.   Objective: Vital signs in last 24 hours: Temp:  [98.4 F (36.9 C)-98.8 F (37.1 C)] 98.6 F (37 C) (01/03 9366) Pulse Rate:  [72-82] 72 (01/03 0633) Resp:  [16-19] 18 (01/03 0633) BP: (127-136)/(73-77) 127/74 (01/03 9366) SpO2:  [94 %-98 %] 98 % (01/02 1919) Last BM Date : 05/03/24  Intake/Output from previous day: 01/02 0701 - 01/03 0700 In: 598 [P.O.:598] Out: 2300 [Urine:2300] Intake/Output this shift: No intake/output data recorded.  PE: Gen:  Alert, NAD, pleasant Card:  Regular rate and rhythm Pulm:  Normal effort ORA Abd: Soft, appropriately tender around incisions, mild upper abdominal fullness, no guarding. Incisions c/d/i Skin: warm and dry, no rashes  Psych: A&Ox3   Lab Results:  Recent Labs    05/03/24 0610 05/04/24 0825  WBC 14.4* 10.5  HGB 13.1 13.2  HCT 38.3* 38.6*  PLT 233 223   BMET Recent Labs    05/03/24 0610 05/04/24 0825  NA 141 140  K 3.5 3.4*  CL 108 106  CO2 22 23  GLUCOSE 89 92  BUN 7 7  CREATININE 0.75 0.80  CALCIUM  9.0 9.1   PT/INR No results for input(s): LABPROT, INR in the last 72 hours.  CMP     Component Value Date/Time   NA 140 05/04/2024 0825   K 3.4 (L) 05/04/2024 0825   CL 106 05/04/2024 0825   CO2 23 05/04/2024 0825   GLUCOSE 92 05/04/2024 0825   BUN 7 05/04/2024 0825   CREATININE 0.80 05/04/2024 0825   CREATININE 0.95 03/09/2015 1042   CALCIUM  9.1 05/04/2024 0825   PROT 6.5 05/04/2024 0825   ALBUMIN 3.7 05/04/2024 0825   AST 35 05/04/2024 0825   ALT 165 (H) 05/04/2024 0825   ALKPHOS 120 05/04/2024 0825   BILITOT 0.9 05/04/2024 0825   GFRNONAA >60 05/04/2024 0825   GFRNONAA 86 09/11/2013 0826   GFRAA >60 08/14/2017 0344   GFRAA >89 09/11/2013 0826   Lipase     Component Value Date/Time   LIPASE 18 04/30/2024  1250       Studies/Results: No results found.   Anti-infectives: Anti-infectives (From admission, onward)    Start     Dose/Rate Route Frequency Ordered Stop   04/30/24 1715  piperacillin -tazobactam (ZOSYN ) IVPB 3.375 g        3.375 g 12.5 mL/hr over 240 Minutes Intravenous Every 8 hours 04/30/24 1700           Latest Ref Rng & Units 05/04/2024    8:25 AM 05/03/2024    6:10 AM 05/02/2024    5:06 AM  Hepatic Function  Total Protein 6.5 - 8.1 g/dL 6.5  6.6  6.3   Albumin 3.5 - 5.0 g/dL 3.7  3.6  3.7   AST 15 - 41 U/L 35  48  61   ALT 0 - 44 U/L 165  228  312   Alk Phosphatase 38 - 126 U/L 120  144  154   Total Bilirubin 0.0 - 1.2 mg/dL 0.9  1.4  1.5      Assessment/Plan  Acute cholecystitis, possible recent choledocholithiasis  POD#2 s/p laparoscopic cholecystectomy with ICG Dr Teresa 1/1 - LFTs reassuring and tolerating PO - Okay for discharge from surgery perspective. Please reach out with questions/concerns   LOS: 4 days   Cordella  Polly, MD [facility] Please see Amion for pager number during day hours 7:00am-4:30pm

## 2024-05-04 NOTE — Plan of Care (Signed)

## 2024-05-04 NOTE — Discharge Summary (Signed)
 "  Physician Discharge Summary  Bobby Stafford FMW:[id number] DOB: November 06, 1975 DOA: 04/30/2024  PCP: Patient, No Pcp Per  Admit date: 04/30/2024 Discharge date: 05/04/2024  Admitted From: SNF Disposition:  SNF  Recommendations for Outpatient Follow-up:  Follow up with PCP in 1-2 weeks  Home Health: none Equipment/Devices: none  Discharge Condition: stable CODE STATUS: Full code Diet Orders (From admission, onward)     Start     Ordered   05/03/24 1139  Diet regular Room service appropriate? Yes; Fluid consistency: Thin  Diet effective now       Question Answer Comment  Room service appropriate? Yes   Fluid consistency: Thin      05/03/24 1138            Brief Narrative / Interim history: 49 year old male with history of MS, paraparesis, lives in a nursing home comes into the hospital with right upper quadrant abdominal pain.  He was found to have significant elevation of LFTs, GI was consulted and he was admitted to the hospital  Hospital Course / Discharge diagnoses: Principal problem Elevated LFTs-on admission was found to have AST and ALT into the 3-600 range, white count of 32K and a total bilirubin of 6.3.  CT scan of the abdomen and pelvis was without acute abnormalities, but an ultrasound revealed cholelithiasis with mild gallbladder wall thickening at 4 mm.  GI was consulted, he underwent MRI/MRCP without choledocholithiasis. Surgery consulted, he is now status post laparoscopic cholecystectomy 1/1.  He recovered well postoperatively, LFTs are improving, white count normalized, tolerating a regular diet, was seen by general surgery and given stability he will be discharged back to SNF in stable condition.  Active problems MS, paraparesis -wheelchair-bound, has been in the nursing home for the past 2 years.  He has right upper extremity bilateral lower extremity weakness.  Continue home medications Leukocytosis-due to #1, has been maintained on antibiotics while here,  white count is now normalized.  Sepsis ruled out   Discharge Instructions   Allergies as of 05/04/2024       Reactions   Topiramate  Other (See Comments)   Seizures Causes seizures        Medication List     TAKE these medications    Ampyra  10 MG Tb12 Generic drug: dalfampridine  Take 10 mg by mouth every 12 (twelve) hours.   aspirin  81 MG chewable tablet Chew 81 mg by mouth daily.   atorvastatin  10 MG tablet Commonly known as: LIPITOR Take 10 mg by mouth at bedtime.   baclofen  20 MG tablet Commonly known as: LIORESAL  Take 20 mg by mouth 3 (three) times daily.   Biofreeze Cool The Pain 4 % Gel Generic drug: Menthol (Topical Analgesic) Apply 1 Application topically 2 (two) times daily. Apply a thin layer topically twice a day at 1100 and 2100.  Apply to left shoulder for pain   Biofreeze Cool The Pain 4 % Gel Generic drug: Menthol (Topical Analgesic) Apply 1 Application topically every 8 (eight) hours as needed (left shoulder pain).   celecoxib 400 MG capsule Commonly known as: CELEBREX Take 400 mg by mouth in the morning.   [employer] Lidocaine  Pain Relief 4 % Generic drug: lidocaine  Place 1 patch onto the skin daily.   DULoxetine  60 MG capsule Commonly known as: CYMBALTA  Take 60 mg by mouth in the morning.   lactulose  10 GM/15ML solution Commonly known as: CHRONULAC  Take 20 g by mouth at bedtime.   Magnesium Glycinate 100 MG Caps Take 300 mg by mouth  in the morning.   multivitamin with minerals Tabs tablet Take 1 tablet by mouth in the morning.   omega-3 acid ethyl esters 1 g capsule Commonly known as: LOVAZA Take 1 g by mouth at bedtime.   predniSONE  10 MG tablet Commonly known as: DELTASONE  Take 10 mg by mouth in the morning.   pregabalin  75 MG capsule Commonly known as: LYRICA  Take 1 capsule (75 mg total) by mouth 3 (three) times daily.   saccharomyces boulardii 250 MG capsule Commonly known as: FLORASTOR Take 250 mg by mouth in the  morning.   senna-docusate 8.6-50 MG tablet Commonly known as: Senokot-S Take 2 tablets by mouth at bedtime.   topiramate  50 MG tablet Commonly known as: TOPAMAX  Take 100 mg by mouth 2 (two) times daily.   traMADol  50 MG tablet Commonly known as: ULTRAM  Take 1 tablet (50 mg total) by mouth every 8 (eight) hours as needed for severe pain (pain score 7-10). What changed: reasons to take this   Vitamin D  (Ergocalciferol ) 1.25 MG (50000 UNIT) Caps capsule Commonly known as: DRISDOL Take 50,000 Units by mouth every Monday.   zinc  gluconate 50 MG tablet Take 50 mg by mouth in the morning.       Consultations: Gastroenterology General Surgery  Procedures/Studies:  MR ABDOMEN MRCP W WO CONTRAST Result Date: 05/01/2024 EXAM: MRCP WITH AND WITHOUT IV CONTRAST 05/01/2024 02:47:00 PM TECHNIQUE: Multisequence, multiplanar magnetic resonance images of the abdomen with and without intravenous contrast. MRCP sequences were performed. Contrast: 10 cc gadovist COMPARISON: US  abdomen limited 04/30/2024 and CT abdomen and pelvis 04/30/2024. CLINICAL HISTORY: Epigastric pain; 885157 Epigastric pain 114842; Cholelithiasis probable cholecystitis, elevated LFTs. FINDINGS: LIVER: Unremarkable. GALLBLADDER AND BILIARY SYSTEM: Numerous tiny stones are identified within the gallbladder measuring up to 4 mm. There is equivocal wall thickening with diffuse gallbladder wall enhancement. Gallbladder wall thickness measures upper limits of normal at 3 mm. No pericholecystic fluid or soft tissue stranding. No intrahepatic bile duct dilatation. The common bile duct is normal in caliber measuring 5 mm, image 19/5. MRCP images are motion limited without signs of choledocholithiasis. SPLEEN: The spleen is within normal limits in size and appearance. PANCREAS/PANCREATIC DUCT: The pancreas is normal in size and contour without a focal lesion or ductal dilatation. ADRENAL GLANDS: Normal size and morphology bilaterally. No  nodule, thickening, or hemorrhage. No periadrenal stranding. KIDNEYS: Unremarkable. LYMPH NODES: No enlarged abdominal lymph nodes. VASCULATURE: Unremarkable. PERITONEUM: No ascites. ABDOMINAL WALL: No hernia. No mass. BOWEL: Grossly unremarkable. No bowel obstruction. BONES: No acute abnormality or worrisome osseous lesion. SOFT TISSUES: Unremarkable. MISCELLANEOUS: Unremarkable. IMPRESSION: 1. Numerous tiny gallstones with equivocal imaging findings for acute cholecystitis, with differential considerations including early acute cholecystitis versus chronic cholecystitis. 2. No intrahepatic or extrahepatic bile duct dilatation. 3. Motion-limited MRCP without choledocholithiasis identified; if ongoing concern for biliary obstruction, consider repeat MRCP when able or endoscopic ultrasound or ERCP. Electronically signed by: Waddell Calk MD 05/01/2024 03:37 PM EST RP Workstation: [id number]   MR 3D Recon At Scanner Result Date: 05/01/2024 EXAM: MRCP WITH AND WITHOUT IV CONTRAST 05/01/2024 02:47:00 PM TECHNIQUE: Multisequence, multiplanar magnetic resonance images of the abdomen with and without intravenous contrast. MRCP sequences were performed. Contrast: 10 cc gadovist COMPARISON: US  abdomen limited 04/30/2024 and CT abdomen and pelvis 04/30/2024. CLINICAL HISTORY: Epigastric pain; 885157 Epigastric pain 114842; Cholelithiasis probable cholecystitis, elevated LFTs. FINDINGS: LIVER: Unremarkable. GALLBLADDER AND BILIARY SYSTEM: Numerous tiny stones are identified within the gallbladder measuring up to 4 mm. There is equivocal wall thickening with  diffuse gallbladder wall enhancement. Gallbladder wall thickness measures upper limits of normal at 3 mm. No pericholecystic fluid or soft tissue stranding. No intrahepatic bile duct dilatation. The common bile duct is normal in caliber measuring 5 mm, image 19/5. MRCP images are motion limited without signs of choledocholithiasis. SPLEEN: The spleen is within normal  limits in size and appearance. PANCREAS/PANCREATIC DUCT: The pancreas is normal in size and contour without a focal lesion or ductal dilatation. ADRENAL GLANDS: Normal size and morphology bilaterally. No nodule, thickening, or hemorrhage. No periadrenal stranding. KIDNEYS: Unremarkable. LYMPH NODES: No enlarged abdominal lymph nodes. VASCULATURE: Unremarkable. PERITONEUM: No ascites. ABDOMINAL WALL: No hernia. No mass. BOWEL: Grossly unremarkable. No bowel obstruction. BONES: No acute abnormality or worrisome osseous lesion. SOFT TISSUES: Unremarkable. MISCELLANEOUS: Unremarkable. IMPRESSION: 1. Numerous tiny gallstones with equivocal imaging findings for acute cholecystitis, with differential considerations including early acute cholecystitis versus chronic cholecystitis. 2. No intrahepatic or extrahepatic bile duct dilatation. 3. Motion-limited MRCP without choledocholithiasis identified; if ongoing concern for biliary obstruction, consider repeat MRCP when able or endoscopic ultrasound or ERCP. Electronically signed by: Waddell Calk MD 05/01/2024 03:37 PM EST RP Workstation: [id number]   US  Abdomen Limited RUQ (LIVER/GB) Result Date: 04/30/2024 CLINICAL DATA:  Elevated liver function tests EXAM: ULTRASOUND ABDOMEN LIMITED RIGHT UPPER QUADRANT COMPARISON:  CT scan of same day FINDINGS: Gallbladder: WES sign is noted suggesting cholelithiasis, presumably non calcified as they do not appear on CT scan. Mild gallbladder wall thickening is noted at 4 mm. No sonographic Murphy's sign is noted. Common bile duct: Diameter: 5 mm which is within normal limits. Liver: No focal lesion identified. Within normal limits in parenchymal echogenicity. Portal vein is patent on color Doppler imaging with normal direction of blood flow towards the liver. Other: None. IMPRESSION: Findings consistent with cholelithiasis with mild gallbladder wall thickening at 4 mm. Presumably the stones are noncalcified as they are not clearly  visualized on CT scan of same day. If there is clinical concern for cholecystitis, HIDA scan is recommended for further evaluation. Electronically Signed   By: Lynwood Landy Raddle M.D.   On: 04/30/2024 15:30   CT ABDOMEN PELVIS W CONTRAST Result Date: 04/30/2024 CLINICAL DATA:  Mild right upper quadrant abdominal pain. Abnormal liver enzymes. EXAM: CT ABDOMEN AND PELVIS WITH CONTRAST TECHNIQUE: Multidetector CT imaging of the abdomen and pelvis was performed using the standard protocol following bolus administration of intravenous contrast. RADIATION DOSE REDUCTION: This exam was performed according to the departmental dose-optimization program which includes automated exposure control, adjustment of the mA and/or kV according to patient size and/or use of iterative reconstruction technique. CONTRAST:  75mL OMNIPAQUE  IOHEXOL  350 MG/ML SOLN COMPARISON:  None Available. FINDINGS: Lower chest: No acute abnormality. Hepatobiliary: No focal liver abnormality is seen. No gallstones, gallbladder wall thickening, or biliary dilatation. Pancreas: Unremarkable. No pancreatic ductal dilatation or surrounding inflammatory changes. Spleen: Normal in size without focal abnormality. Adrenals/Urinary Tract: Adrenal glands are unremarkable. Kidneys are normal, without renal calculi, focal lesion, or hydronephrosis. Bladder is unremarkable. Stomach/Bowel: Stomach is within normal limits. Appendix appears normal. No evidence of bowel wall thickening, distention, or inflammatory changes. Vascular/Lymphatic: Aortic atherosclerosis. No enlarged abdominal or pelvic lymph nodes. Reproductive: Prostate is unremarkable. Other: No abdominal wall hernia or abnormality. No abdominopelvic ascites. Musculoskeletal: No acute or significant osseous findings. IMPRESSION: 1. No acute abnormality seen in the abdomen or pelvis. 2. Aortic atherosclerosis. Aortic Atherosclerosis (ICD10-I70.0). Electronically Signed   By: Lynwood Landy Raddle M.D.   On:  04/30/2024 15:20  Subjective: - no chest pain, shortness of breath, no abdominal pain, nausea or vomiting.   Discharge Exam: BP 127/74 (BP Location: Right Arm)   Pulse 72   Temp 98.6 F (37 C)   Resp 18   Ht 6' 4.5 (1.943 m)   Wt 130.2 kg   SpO2 98%   BMI 34.48 kg/m   General: Pt is alert, awake, not in acute distress Cardiovascular: RRR, S1/S2 +, no rubs, no gallops Respiratory: CTA bilaterally, no wheezing, no rhonchi Abdominal: Soft, NT, ND, bowel sounds + Extremities: no edema, no cyanosis    The results of significant diagnostics from this hospitalization (including imaging, microbiology, ancillary and laboratory) are listed below for reference.     Microbiology: No results found for this or any previous visit (from the past 240 hours).   Labs: Basic Metabolic Panel: Recent Labs  Lab 04/30/24 1250 05/01/24 0340 05/02/24 0506 05/03/24 0610 05/04/24 0825  NA 140 139 142 141 140  K 4.0 3.6 3.4* 3.5 3.4*  CL 108 108 109 108 106  CO2 22 21* 20* 22 23  GLUCOSE 88 79 107* 89 92  BUN 16 13 13 7 7   CREATININE 0.89 0.77 0.78 0.75 0.80  CALCIUM  9.3 9.1 9.1 9.0 9.1  MG  --   --  2.0  --  2.1   Liver Function Tests: Recent Labs  Lab 04/30/24 1250 05/01/24 0340 05/02/24 0506 05/03/24 0610 05/04/24 0825  AST 336* 170* 61* 48* 35  ALT 654* 474* 312* 228* 165*  ALKPHOS 220* 187* 154* 144* 120  BILITOT 6.3* 2.5* 1.5* 1.4* 0.9  PROT 7.0 6.5 6.3* 6.6 6.5  ALBUMIN 4.1 3.7 3.7 3.6 3.7   CBC: Recent Labs  Lab 04/30/24 1250 05/01/24 0340 05/02/24 0506 05/03/24 0610 05/04/24 0825  WBC 32.5* 23.6* 12.5* 14.4* 10.5  NEUTROABS 28.5*  --   --   --   --   HGB 14.3 13.4 13.2 13.1 13.2  HCT 43.0 39.8 38.8* 38.3* 38.6*  MCV 98.6 99.3 96.0 96.5 97.0  PLT 247 188 214 233 223   CBG: Recent Labs  Lab 04/30/24 2142 05/01/24 2342 05/02/24 0751 05/02/24 1617  GLUCAP 85 134* 105* 120*   Hgb A1c No results for input(s): HGBA1C in the last 72 hours. Lipid  Profile No results for input(s): CHOL, HDL, LDLCALC, TRIG, CHOLHDL, LDLDIRECT in the last 72 hours. Thyroid function studies No results for input(s): TSH, T4TOTAL, T3FREE, THYROIDAB in the last 72 hours.  Invalid input(s): FREET3 Urinalysis    Component Value Date/Time   COLORURINE YELLOW 08/14/2017 0344   APPEARANCEUR CLEAR 08/14/2017 0344   LABSPEC 1.026 08/14/2017 0344   PHURINE 5.0 08/14/2017 0344   GLUCOSEU NEGATIVE 08/14/2017 0344   HGBUR SMALL (A) 08/14/2017 0344   BILIRUBINUR NEGATIVE 08/14/2017 0344   KETONESUR NEGATIVE 08/14/2017 0344   PROTEINUR NEGATIVE 08/14/2017 0344   UROBILINOGEN 1.0 10/16/2013 1723   NITRITE NEGATIVE 08/14/2017 0344   LEUKOCYTESUR NEGATIVE 08/14/2017 0344    FURTHER DISCHARGE INSTRUCTIONS:   Get Medicines reviewed and adjusted: Please take all your medications with you for your next visit with your Primary MD   Laboratory/radiological data: Please request your Primary MD to go over all hospital tests and procedure/radiological results at the follow up, please ask your Primary MD to get all Hospital records sent to his/her office.   In some cases, they will be blood work, cultures and biopsy results pending at the time of your discharge. Please request that your primary care  M.D. goes through all the records of your hospital data and follows up on these results.   Also Note the following: If you experience worsening of your admission symptoms, develop shortness of breath, life threatening emergency, suicidal or homicidal thoughts you must seek medical attention immediately by calling 911 or calling your MD immediately  if symptoms less severe.   You must read complete instructions/literature along with all the possible adverse reactions/side effects for all the Medicines you take and that have been prescribed to you. Take any new Medicines after you have completely understood and accpet all the possible adverse reactions/side  effects.    Do not drive when taking Pain medications or sleeping medications (Benzodaizepines)   Do not take more than prescribed Pain, Sleep and Anxiety Medications. It is not advisable to combine anxiety,sleep and pain medications without talking with your primary care practitioner   Special Instructions: If you have smoked or chewed Tobacco  in the last 2 yrs please stop smoking, stop any regular Alcohol  and or any Recreational drug use.   Wear Seat belts while driving.   Please note: You were cared for by a hospitalist during your hospital stay. Once you are discharged, your primary care physician will handle any further medical issues. Please note that NO REFILLS for any discharge medications will be authorized once you are discharged, as it is imperative that you return to your primary care physician (or establish a relationship with a primary care physician if you do not have one) for your post hospital discharge needs so that they can reassess your need for medications and monitor your lab values.  Time coordinating discharge: 35 minutes  SIGNED:  Nilda Fendt, MD, PhD 05/04/2024, 12:54 PM   "

## 2024-05-04 NOTE — TOC Transition Note (Signed)
 Transition of Care [facility]) - Discharge Note   Patient Details  Name: Bobby Stafford MRN: [id number] Date of Birth: 1975-05-24  [facility] CM/SW Contact:  Bridget Cordella Simmonds, LCSW Phone Number: 05/04/2024, 3:59 PM   Clinical Narrative:   Pt discharging to [facility].  RN call report to [phone].  PTAR called 1600.    Final next level of care: Long Term Nursing Home Barriers to Discharge: No Barriers Identified   Patient Goals and CMS Choice     Choice offered to / list presented to : Patient (pt does plan to return to [facility])      Discharge Placement              Patient chooses bed at:  [facility]) Patient to be transferred to facility by: ptar Name of family member notified: neither listed contact phone number was working    [employer] Additional resources added to the After Visit Summary for       Post Acute Care Choice: Skilled Nursing Facility                               Social Drivers of Health (SDOH) Interventions SDOH Screenings   Food Insecurity: No Food Insecurity (05/01/2024)  Housing: Low Risk (05/01/2024)  Transportation Needs: No Transportation Needs (05/01/2024)  Utilities: Not At Risk (05/01/2024)  Social Connections: Unknown (09/13/2021)   Received from [facility]  Tobacco Use: High Risk (05/02/2024)     Readmission Risk Interventions     No data to display

## 2024-05-04 NOTE — Progress Notes (Signed)
 Discharged to [facility] via [facility] at 2025 Discharge papers and meds sent with patient.

## 2024-05-06 LAB — SURGICAL PATHOLOGY
